# Patient Record
Sex: Female | Born: 1965 | Race: White | Hispanic: No | Smoking: Former smoker
Health system: Southern US, Community
[De-identification: ages and names within clinical notes are randomized; demographics above are authoritative.]

## PROBLEM LIST (undated history)

## (undated) DIAGNOSIS — L309 Dermatitis, unspecified: Secondary | ICD-10-CM

## (undated) DIAGNOSIS — F338 Other recurrent depressive disorders: Secondary | ICD-10-CM

## (undated) DIAGNOSIS — R946 Abnormal results of thyroid function studies: Secondary | ICD-10-CM

## (undated) DIAGNOSIS — E785 Hyperlipidemia, unspecified: Secondary | ICD-10-CM

## (undated) DIAGNOSIS — R600 Localized edema: Secondary | ICD-10-CM

## (undated) DIAGNOSIS — R030 Elevated blood-pressure reading, without diagnosis of hypertension: Secondary | ICD-10-CM

## (undated) DIAGNOSIS — D219 Benign neoplasm of connective and other soft tissue, unspecified: Secondary | ICD-10-CM

## (undated) DIAGNOSIS — R7303 Prediabetes: Secondary | ICD-10-CM

## (undated) DIAGNOSIS — S31831A Laceration without foreign body of anus, initial encounter: Secondary | ICD-10-CM

## (undated) DIAGNOSIS — E063 Autoimmune thyroiditis: Secondary | ICD-10-CM

## (undated) HISTORY — DX: Autoimmune thyroiditis: E06.3

## (undated) HISTORY — DX: Abnormal results of thyroid function studies: R94.6

## (undated) HISTORY — DX: Laceration without foreign body of anus, initial encounter: S31.831A

## (undated) HISTORY — DX: Benign neoplasm of connective and other soft tissue, unspecified: D21.9

## (undated) HISTORY — DX: Other recurrent depressive disorders: F33.8

## (undated) HISTORY — DX: Localized edema: R60.0

## (undated) HISTORY — DX: Hyperlipidemia, unspecified: E78.5

## (undated) HISTORY — DX: Prediabetes: R73.03

---

## 1998-02-10 ENCOUNTER — Other Ambulatory Visit: Admission: RE | Admit: 1998-02-10 | Discharge: 1998-02-10 | Payer: Self-pay | Admitting: Obstetrics & Gynecology

## 1999-07-21 ENCOUNTER — Other Ambulatory Visit: Admission: RE | Admit: 1999-07-21 | Discharge: 1999-07-21 | Payer: Self-pay | Admitting: *Deleted

## 1999-08-09 ENCOUNTER — Encounter: Payer: Self-pay | Admitting: *Deleted

## 1999-08-09 ENCOUNTER — Ambulatory Visit (HOSPITAL_COMMUNITY): Admission: RE | Admit: 1999-08-09 | Discharge: 1999-08-09 | Payer: Self-pay | Admitting: *Deleted

## 1999-09-18 ENCOUNTER — Other Ambulatory Visit: Admission: RE | Admit: 1999-09-18 | Discharge: 1999-09-18 | Payer: Self-pay | Admitting: *Deleted

## 1999-09-18 ENCOUNTER — Encounter (INDEPENDENT_AMBULATORY_CARE_PROVIDER_SITE_OTHER): Payer: Self-pay | Admitting: Specialist

## 1999-12-07 ENCOUNTER — Ambulatory Visit (HOSPITAL_COMMUNITY): Admission: RE | Admit: 1999-12-07 | Discharge: 1999-12-07 | Payer: Self-pay | Admitting: *Deleted

## 1999-12-07 ENCOUNTER — Encounter (INDEPENDENT_AMBULATORY_CARE_PROVIDER_SITE_OTHER): Payer: Self-pay

## 2000-05-09 ENCOUNTER — Other Ambulatory Visit: Admission: RE | Admit: 2000-05-09 | Discharge: 2000-05-09 | Payer: Self-pay | Admitting: *Deleted

## 2000-08-13 HISTORY — PX: CERVICAL BIOPSY  W/ LOOP ELECTRODE EXCISION: SUR135

## 2000-10-28 ENCOUNTER — Other Ambulatory Visit: Admission: RE | Admit: 2000-10-28 | Discharge: 2000-10-28 | Payer: Self-pay | Admitting: Obstetrics and Gynecology

## 2004-02-17 ENCOUNTER — Ambulatory Visit (HOSPITAL_COMMUNITY): Admission: RE | Admit: 2004-02-17 | Discharge: 2004-02-17 | Payer: Self-pay | Admitting: Obstetrics and Gynecology

## 2004-10-20 ENCOUNTER — Ambulatory Visit: Payer: Self-pay | Admitting: Internal Medicine

## 2005-01-03 ENCOUNTER — Other Ambulatory Visit: Admission: RE | Admit: 2005-01-03 | Discharge: 2005-01-03 | Payer: Self-pay | Admitting: Family Medicine

## 2005-07-18 ENCOUNTER — Other Ambulatory Visit: Admission: RE | Admit: 2005-07-18 | Discharge: 2005-07-18 | Payer: Self-pay | Admitting: Obstetrics and Gynecology

## 2005-08-07 ENCOUNTER — Ambulatory Visit (HOSPITAL_COMMUNITY): Admission: RE | Admit: 2005-08-07 | Discharge: 2005-08-07 | Payer: Self-pay | Admitting: Obstetrics and Gynecology

## 2005-12-20 ENCOUNTER — Other Ambulatory Visit: Admission: RE | Admit: 2005-12-20 | Discharge: 2005-12-20 | Payer: Self-pay | Admitting: Obstetrics and Gynecology

## 2006-09-18 ENCOUNTER — Ambulatory Visit (HOSPITAL_COMMUNITY): Admission: RE | Admit: 2006-09-18 | Discharge: 2006-09-18 | Payer: Self-pay | Admitting: Obstetrics and Gynecology

## 2006-10-15 ENCOUNTER — Ambulatory Visit: Payer: Self-pay | Admitting: Internal Medicine

## 2006-10-16 ENCOUNTER — Ambulatory Visit: Payer: Self-pay | Admitting: Internal Medicine

## 2006-10-31 ENCOUNTER — Ambulatory Visit: Payer: Self-pay

## 2006-11-06 ENCOUNTER — Ambulatory Visit: Payer: Self-pay | Admitting: Internal Medicine

## 2007-02-07 ENCOUNTER — Encounter: Payer: Self-pay | Admitting: Internal Medicine

## 2007-02-19 ENCOUNTER — Encounter: Payer: Self-pay | Admitting: Internal Medicine

## 2007-05-29 ENCOUNTER — Ambulatory Visit: Payer: Self-pay | Admitting: Internal Medicine

## 2007-06-08 LAB — CONVERTED CEMR LAB
Cholesterol: 217 mg/dL (ref 0–200)
Direct LDL: 135.8 mg/dL
HDL: 33.6 mg/dL — ABNORMAL LOW (ref >39.0)
Hgb A1c MFr Bld: 5.8 % (ref 4.6–6.0)

## 2007-06-09 DIAGNOSIS — IMO0002 Reserved for concepts with insufficient information to code with codable children: Secondary | ICD-10-CM

## 2007-06-09 DIAGNOSIS — N893 Dysplasia of vagina, unspecified: Secondary | ICD-10-CM

## 2007-06-09 DIAGNOSIS — R809 Proteinuria, unspecified: Secondary | ICD-10-CM | POA: Insufficient documentation

## 2007-06-09 DIAGNOSIS — M50321 Other cervical disc degeneration at C4-C5 level: Secondary | ICD-10-CM | POA: Insufficient documentation

## 2007-06-09 DIAGNOSIS — N809 Endometriosis, unspecified: Secondary | ICD-10-CM | POA: Insufficient documentation

## 2007-06-09 HISTORY — DX: Dysplasia of vagina, unspecified: N89.3

## 2007-06-10 ENCOUNTER — Encounter (INDEPENDENT_AMBULATORY_CARE_PROVIDER_SITE_OTHER): Payer: Self-pay | Admitting: *Deleted

## 2007-06-10 ENCOUNTER — Ambulatory Visit: Payer: Self-pay | Admitting: Internal Medicine

## 2007-06-10 DIAGNOSIS — E782 Mixed hyperlipidemia: Secondary | ICD-10-CM

## 2007-06-10 DIAGNOSIS — E785 Hyperlipidemia, unspecified: Secondary | ICD-10-CM | POA: Insufficient documentation

## 2007-06-10 LAB — CONVERTED CEMR LAB
Cholesterol, target level: 200 mg/dL
HDL goal, serum: 40 mg/dL
LDL Goal: 160 mg/dL

## 2007-07-21 ENCOUNTER — Ambulatory Visit: Payer: Self-pay | Admitting: Internal Medicine

## 2007-10-23 ENCOUNTER — Ambulatory Visit (HOSPITAL_COMMUNITY): Admission: RE | Admit: 2007-10-23 | Discharge: 2007-10-23 | Payer: Self-pay | Admitting: Obstetrics and Gynecology

## 2008-02-12 ENCOUNTER — Ambulatory Visit: Payer: Self-pay | Admitting: Internal Medicine

## 2008-02-17 ENCOUNTER — Ambulatory Visit: Payer: Self-pay | Admitting: Internal Medicine

## 2008-02-26 ENCOUNTER — Telehealth: Payer: Self-pay | Admitting: Internal Medicine

## 2008-02-27 ENCOUNTER — Ambulatory Visit: Payer: Self-pay | Admitting: Internal Medicine

## 2008-02-27 LAB — CONVERTED CEMR LAB
AST: 23 units/L (ref 0–37)
Albumin: 4 g/dL (ref 3.5–5.2)
Alkaline Phosphatase: 75 units/L (ref 39–117)
BUN: 14 mg/dL (ref 6–23)
Bilirubin Urine: NEGATIVE
Bilirubin, Direct: 0.1 mg/dL (ref 0.0–0.3)
Chloride: 94 meq/L — ABNORMAL LOW (ref 96–112)
Cholesterol, target level: 200 mg/dL
Glucose, Bld: 120 mg/dL — ABNORMAL HIGH (ref 70–99)
Glucose, Urine, Semiquant: NEGATIVE
Hgb A1c MFr Bld: 6.1 % — ABNORMAL HIGH (ref 4.6–6.0)
Ketones, urine, test strip: NEGATIVE
LDL Goal: 100 mg/dL
Potassium: 3.5 meq/L (ref 3.5–5.1)
Protein, U semiquant: NEGATIVE
Sodium: 134 meq/L — ABNORMAL LOW (ref 135–145)
Total Protein: 8 g/dL (ref 6.0–8.3)
Urobilinogen, UA: 0.2
pH: 5

## 2008-02-28 ENCOUNTER — Encounter: Payer: Self-pay | Admitting: Internal Medicine

## 2008-03-01 ENCOUNTER — Encounter (INDEPENDENT_AMBULATORY_CARE_PROVIDER_SITE_OTHER): Payer: Self-pay | Admitting: *Deleted

## 2008-03-03 ENCOUNTER — Encounter: Payer: Self-pay | Admitting: Internal Medicine

## 2008-06-04 ENCOUNTER — Ambulatory Visit: Payer: Self-pay | Admitting: Internal Medicine

## 2008-06-07 ENCOUNTER — Encounter (INDEPENDENT_AMBULATORY_CARE_PROVIDER_SITE_OTHER): Payer: Self-pay | Admitting: *Deleted

## 2008-06-07 LAB — CONVERTED CEMR LAB
AST: 16 units/L (ref 0–37)
Albumin: 3.8 g/dL (ref 3.5–5.2)
Alkaline Phosphatase: 64 units/L (ref 39–117)
BUN: 15 mg/dL (ref 6–23)
Bilirubin, Direct: 0.1 mg/dL (ref 0.0–0.3)
Creatinine, Ser: 0.9 mg/dL (ref 0.4–1.2)
LDL Cholesterol: 137 mg/dL — ABNORMAL HIGH (ref 0–99)
Potassium: 4.1 meq/L (ref 3.5–5.1)
Total Bilirubin: 0.8 mg/dL (ref 0.3–1.2)
Total CHOL/HDL Ratio: 5.2
Triglycerides: 91 mg/dL (ref 0–149)

## 2008-06-18 ENCOUNTER — Ambulatory Visit: Payer: Self-pay | Admitting: Internal Medicine

## 2008-06-18 LAB — CONVERTED CEMR LAB
Cholesterol, target level: 200 mg/dL
HDL goal, serum: 50 mg/dL
LDL Goal: 100 mg/dL

## 2008-08-26 ENCOUNTER — Encounter: Payer: Self-pay | Admitting: Internal Medicine

## 2008-09-22 ENCOUNTER — Encounter: Payer: Self-pay | Admitting: Internal Medicine

## 2008-09-27 ENCOUNTER — Telehealth (INDEPENDENT_AMBULATORY_CARE_PROVIDER_SITE_OTHER): Payer: Self-pay | Admitting: *Deleted

## 2008-09-29 ENCOUNTER — Ambulatory Visit: Payer: Self-pay | Admitting: Internal Medicine

## 2008-10-03 LAB — CONVERTED CEMR LAB
ALT: 21 units/L (ref 0–35)
AST: 20 units/L (ref 0–37)
Alkaline Phosphatase: 61 units/L (ref 39–117)
BUN: 16 mg/dL (ref 6–23)
Bilirubin, Direct: 0.1 mg/dL (ref 0.0–0.3)
Creatinine, Ser: 0.8 mg/dL (ref 0.4–1.2)
Creatinine,U: 93.1 mg/dL
Hgb A1c MFr Bld: 5.8 % (ref 4.6–6.0)
Total Bilirubin: 0.7 mg/dL (ref 0.3–1.2)
Total CHOL/HDL Ratio: 4

## 2008-10-04 ENCOUNTER — Ambulatory Visit: Payer: Self-pay | Admitting: Internal Medicine

## 2008-11-29 ENCOUNTER — Ambulatory Visit: Payer: Self-pay | Admitting: Internal Medicine

## 2008-12-01 ENCOUNTER — Encounter (INDEPENDENT_AMBULATORY_CARE_PROVIDER_SITE_OTHER): Payer: Self-pay | Admitting: *Deleted

## 2008-12-01 LAB — CONVERTED CEMR LAB
T3, Free: 2.8 pg/mL (ref 2.3–4.2)
TSH: 3.48 microintl units/mL (ref 0.35–5.50)

## 2009-04-25 ENCOUNTER — Ambulatory Visit: Payer: Self-pay | Admitting: Internal Medicine

## 2009-04-26 ENCOUNTER — Encounter: Payer: Self-pay | Admitting: Internal Medicine

## 2009-04-29 ENCOUNTER — Telehealth (INDEPENDENT_AMBULATORY_CARE_PROVIDER_SITE_OTHER): Payer: Self-pay | Admitting: *Deleted

## 2009-04-29 ENCOUNTER — Ambulatory Visit: Payer: Self-pay | Admitting: Internal Medicine

## 2009-04-29 ENCOUNTER — Encounter (INDEPENDENT_AMBULATORY_CARE_PROVIDER_SITE_OTHER): Payer: Self-pay | Admitting: *Deleted

## 2009-04-29 LAB — CONVERTED CEMR LAB: Rhuematoid fact SerPl-aCnc: 20 intl units/mL (ref 0.0–20.0)

## 2009-05-01 ENCOUNTER — Encounter: Payer: Self-pay | Admitting: Internal Medicine

## 2009-05-01 LAB — CONVERTED CEMR LAB
AST: 19 units/L (ref 0–37)
Albumin: 3.7 g/dL (ref 3.5–5.2)
Alkaline Phosphatase: 58 units/L (ref 39–117)
Bilirubin, Direct: 0 mg/dL (ref 0.0–0.3)
HDL: 35 mg/dL — ABNORMAL LOW (ref >39.00)
Hgb A1c MFr Bld: 6 % (ref 4.6–6.5)
TSH: 2.1 microintl units/mL (ref 0.35–5.50)
Total CHOL/HDL Ratio: 4
Total Protein: 7.4 g/dL (ref 6.0–8.3)
VLDL: 16.2 mg/dL (ref 0.0–40.0)

## 2009-05-02 ENCOUNTER — Encounter (INDEPENDENT_AMBULATORY_CARE_PROVIDER_SITE_OTHER): Payer: Self-pay | Admitting: *Deleted

## 2009-05-20 ENCOUNTER — Encounter: Payer: Self-pay | Admitting: Internal Medicine

## 2009-08-31 ENCOUNTER — Ambulatory Visit: Payer: Self-pay | Admitting: Internal Medicine

## 2009-08-31 LAB — CONVERTED CEMR LAB
Bilirubin Urine: NEGATIVE
Nitrite: NEGATIVE
Specific Gravity, Urine: <1.005
Urobilinogen, UA: 0.2

## 2009-09-01 LAB — CONVERTED CEMR LAB
ALT: 16 units/L (ref 0–35)
Albumin: 3.9 g/dL (ref 3.5–5.2)
Basophils Relative: 0.7 % (ref 0.0–3.0)
CO2: 26 meq/L (ref 19–32)
Cholesterol: 161 mg/dL (ref 0–200)
Eosinophils Relative: 2.1 % (ref 0.0–5.0)
GFR calc non Af Amer: 71.64 mL/min (ref >60)
Glucose, Bld: 95 mg/dL (ref 70–99)
HDL: 45 mg/dL (ref >39.00)
Lymphocytes Relative: 23.1 % (ref 12.0–46.0)
Neutrophils Relative %: 64.3 % (ref 43.0–77.0)
Potassium: 4.1 meq/L (ref 3.5–5.1)
RBC: 4.39 M/uL (ref 3.87–5.11)
Sodium: 140 meq/L (ref 135–145)
Total Protein: 7.3 g/dL (ref 6.0–8.3)
VLDL: 14 mg/dL (ref 0.0–40.0)
WBC: 6.7 10*3/uL (ref 4.5–10.5)

## 2009-09-09 ENCOUNTER — Ambulatory Visit: Payer: Self-pay | Admitting: Internal Medicine

## 2010-09-10 LAB — CONVERTED CEMR LAB
Basophils Absolute: 0 10*3/uL (ref 0.0–0.1)
Basophils Relative: 0.5 % (ref 0.0–1.0)
Calcium: 8.9 mg/dL (ref 8.4–10.5)
Eosinophils Absolute: 0.1 10*3/uL (ref 0.0–0.6)
Eosinophils Relative: 2.2 % (ref 0.0–5.0)
Free T4: 0.7 ng/dL (ref 0.6–1.6)
HCT: 40 % (ref 36.0–46.0)
Hemoglobin: 13.8 g/dL (ref 12.0–15.0)
Hgb A1c MFr Bld: 5.9 % (ref 4.6–6.0)
Lymphocytes Relative: 28.2 % (ref 12.0–46.0)
MCHC: 34.6 g/dL (ref 30.0–36.0)
MCV: 84.5 fL (ref 78.0–100.0)
Magnesium: 2.1 mg/dL (ref 1.5–2.5)
Monocytes Absolute: 0.6 10*3/uL (ref 0.2–0.7)
Monocytes Relative: 8.9 % (ref 3.0–11.0)
Neutro Abs: 4 10*3/uL (ref 1.4–7.7)
Neutrophils Relative %: 60.2 % (ref 43.0–77.0)
Platelets: 236 10*3/uL (ref 150–400)
Potassium: 4 meq/L (ref 3.5–5.1)
RBC: 4.73 M/uL (ref 3.87–5.11)
RDW: 13 % (ref 11.5–14.6)
T3, Free: 3.4 pg/mL (ref 2.3–4.2)
TSH: 2.14 microintl units/mL (ref 0.35–5.50)
WBC: 6.6 10*3/uL (ref 4.5–10.5)

## 2010-09-12 NOTE — Assessment & Plan Note (Signed)
Summary: CPX,LABS PRIOR,BCBS INS/RH.....   Vital Signs:  Patient profile:   45 year old female Height:      68.25 inches Weight:      190.6 pounds BMI:     28.87 Temp:     98.3 degrees F oral Pulse rate:   72 / minute Resp:     14 per minute BP sitting:   102 / 68  (left arm) Cuff size:   large  Vitals Entered By: Shonna Chock (September 09, 2009 2:21 PM) CC: CPX and discuss labs (copy given) Comments REVIEWED MED LIST, PATIENT AGREED DOSE AND INSTRUCTION CORRECT    CC:  CPX and discuss labs (copy given).  History of Present Illness: Allison Compton is here for a physical; she is getting over an URI. Also she strained R shoulder  in yoga.  She has lost 40 # with TLC through her church. Present lipids were done 1 month off Pravastatin 40 mg at bedtime .  Preventive Screening-Counseling & Management  Alcohol-Tobacco     Smoking Status: quit  Caffeine-Diet-Exercise     Does Patient Exercise: yes  Allergies (verified): No Known Drug Allergies  Past History:  Past Medical History: Hyperlipidemia : LDL 148(2167/1290), TG 113, HDL 52                                                                                              Eczema;                                                                                                                                                                abnormal TFTs 09/2008, PMH of;                                                                                                                                 Uterine cysts  with dysfunctional menses  Past Surgical History: surgery for cervical  dysplasia X1; G1 P1  Family History: MGM Pancreatic CA;PGM Pancreatic CA, "blood CA" ; MGF MI @ 59 Father: renal CA, HTN Mother: Thrombocytosis, DM controlled with diet, SAD,asthma bro: gastric bleed, ERD; sister SAD  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         To have Lasik 10/13/2009 ENT:  Complains of hoarseness, nasal congestion, and sinus pressure; denies ear discharge, earache, and sore throat; No frontal headache or facial pain ; purulence from nose. Occasional dysphagia @ end of meal.. Resp:  Denies shortness of breath, sputum productive, and wheezing; Minor cough.  Physical Exam  General:  well-nourished,in no acute distress; alert,appropriate and cooperative throughout examination Head:  Normocephalic and atraumatic without obvious abnormalities. Eyes:  No corneal or conjunctival inflammation noted. Perrla. Funduscopic exam benign, without hemorrhages, exudates or papilledema. Ears:  External ear exam shows no significant lesions or deformities.  Otoscopic examination reveals clear canals, tympanic membranes are intact bilaterally without bulging, retraction, inflammation or discharge. Hearing is grossly normal bilaterally. Nose:  External nasal examination shows no deformity or inflammation. Nasal mucosa are  slightly dry without lesions or exudates. Mouth:  Oral mucosa and oropharynx without lesions or exudates.  Teeth in good repair. Neck:  No deformities, masses, or tenderness noted. Lungs:  Normal respiratory effort, chest expands symmetrically. Lungs are clear to auscultation, no crackles or wheezes. Heart:  Normal rate and regular rhythm. S1 and S2 normal without gallop, murmur, click, rub . S4 Abdomen:  Bowel sounds positive,abdomen soft and non-tender without masses, organomegaly or hernias noted. Msk:  Minor  asymmetry of posterior thoracic musculature Pulses:  R and L carotid,radial,dorsalis pedis and posterior tibial pulses are full and equal bilaterally Extremities:  No clubbing, cyanosis, edema, or deformity noted with normal full range of motion of all joints.   Neurologic:  alert & oriented X3 and DTRs symmetrical and normal.   Skin:  Mild eczematoid rash both shins Cervical Nodes:  No lymphadenopathy noted Axillary Nodes:  No palpable lymphadenopathy Psych:  memory intact for recent and remote, normally interactive, and good eye contact.  Animated & interactive    Impression & Recommendations:  Problem # 1:  PREVENTIVE HEALTH CARE (ICD-V70.0)  Orders: EKG w/ Interpretation (93000)  Problem # 2:  HYPERLIPIDEMIA (ICD-272.2)  The following medications were removed from the medication list:    Pravachol 40 Mg Tabs (Pravastatin sodium) .Marland Kitchen... 1 by mouth at bedtime  Orders: EKG w/ Interpretation (93000)  Complete Medication List: 1)  Fish Oil  .... 2 by mouth once daily  Patient Instructions: 1)  Continue the TLC which have been so incredibly positive for preventive health care. 2)  Please schedule a follow-up appointment in 4 months. 3)  You need to lose weight. Consider a lower calorie diet and regular exercise.  4)  NMR Lipoprofile Lipid Panel : 272.4

## 2010-09-20 ENCOUNTER — Ambulatory Visit: Payer: Self-pay | Admitting: Internal Medicine

## 2010-09-21 ENCOUNTER — Ambulatory Visit: Payer: Self-pay | Admitting: Internal Medicine

## 2010-09-21 ENCOUNTER — Other Ambulatory Visit: Payer: Self-pay | Admitting: Internal Medicine

## 2010-09-21 ENCOUNTER — Encounter: Payer: Self-pay | Admitting: Internal Medicine

## 2010-09-21 ENCOUNTER — Encounter (INDEPENDENT_AMBULATORY_CARE_PROVIDER_SITE_OTHER): Payer: Self-pay | Admitting: *Deleted

## 2010-09-21 ENCOUNTER — Other Ambulatory Visit (INDEPENDENT_AMBULATORY_CARE_PROVIDER_SITE_OTHER): Payer: 59

## 2010-09-21 DIAGNOSIS — Z0000 Encounter for general adult medical examination without abnormal findings: Secondary | ICD-10-CM

## 2010-09-21 LAB — HEPATIC FUNCTION PANEL
AST: 13 U/L (ref 0–37)
Albumin: 3.9 g/dL (ref 3.5–5.2)
Alkaline Phosphatase: 60 U/L (ref 39–117)
Total Protein: 7.2 g/dL (ref 6.0–8.3)

## 2010-09-21 LAB — CBC WITH DIFFERENTIAL/PLATELET
Basophils Absolute: 0 10*3/uL (ref 0.0–0.1)
Eosinophils Relative: 2.4 % (ref 0.0–5.0)
Lymphocytes Relative: 26.4 % (ref 12.0–46.0)
Lymphs Abs: 2 10*3/uL (ref 0.7–4.0)
Monocytes Relative: 7.9 % (ref 3.0–12.0)
Neutrophils Relative %: 62.8 % (ref 43.0–77.0)
Platelets: 200 10*3/uL (ref 150.0–400.0)
RDW: 13.7 % (ref 11.5–14.6)
WBC: 7.7 10*3/uL (ref 4.5–10.5)

## 2010-09-21 LAB — BASIC METABOLIC PANEL
CO2: 26 mEq/L (ref 19–32)
Chloride: 101 mEq/L (ref 96–112)
Creatinine, Ser: 0.9 mg/dL (ref 0.4–1.2)
Potassium: 4.1 mEq/L (ref 3.5–5.1)
Sodium: 135 mEq/L (ref 135–145)

## 2010-09-21 LAB — TSH: TSH: 2.48 u[IU]/mL (ref 0.35–5.50)

## 2010-09-25 ENCOUNTER — Ambulatory Visit: Payer: 59 | Admitting: Internal Medicine

## 2010-10-06 ENCOUNTER — Encounter: Payer: Self-pay | Admitting: Internal Medicine

## 2010-10-06 ENCOUNTER — Ambulatory Visit: Payer: 59 | Admitting: Internal Medicine

## 2010-10-06 ENCOUNTER — Ambulatory Visit (INDEPENDENT_AMBULATORY_CARE_PROVIDER_SITE_OTHER): Payer: 59 | Admitting: Internal Medicine

## 2010-10-06 DIAGNOSIS — E782 Mixed hyperlipidemia: Secondary | ICD-10-CM

## 2010-10-06 DIAGNOSIS — R609 Edema, unspecified: Secondary | ICD-10-CM | POA: Insufficient documentation

## 2010-10-10 NOTE — Assessment & Plan Note (Signed)
Summary: tightness in chest/cbs; went to ER--had pleurisy, needs 30 mi...   Vital Signs:  Patient profile:   45 year old female Weight:      219.4 pounds BMI:     33.24 Temp:     98.3 degrees F oral Pulse rate:   76 / minute Resp:     12 per minute BP sitting:   120 / 72  (left arm) Cuff size:   large  Vitals Entered By: Shonna Chock CMA (October 06, 2010 3:31 PM) CC: 1.) ER follow-up: Chest tightness   2.) Complete form, Lipid Management   CC:  1.) ER follow-up: Chest tightness   2.) Complete form and Lipid Management.  History of Present Illness:    NMR Lipoprofile reviewed & risks discussed. She was seen @ [facility] in [place] last week; Tramadol & heat resolved " pleurisy".Pedal edema is intermittent dependent on salt intake.  The patient denies the following symptoms: exercise intolerance, dypsnea, palpitations, and syncope.  Dietary compliance has been poor but is improving.  The patient reports no exercise until last week.  Adjunctive measures currently used by the patient include fiber, ASA, fish oil supplements, and Co-Q 10..    Lipid Management History:      Negative NCEP/ATP III risk factors include female age less than 8 years old, non-diabetic, no family history for ischemic heart disease, non-tobacco-user status, non-hypertensive, no ASHD (atherosclerotic heart disease), no prior stroke/TIA, no peripheral vascular disease, and no history of aortic aneurysm.     Current Medications (verified): 1)  Fish Oil .... 2 By Mouth Once Daily  Allergies (verified): No Known Drug Allergies  Past History:  Past Medical History: Hyperlipidemia : LDL 148 (2167/1290), TG 113, HDL 52 . LDL goal = < 100, ideally < 70.                                                                                          Eczema;                                                                                                                                                                 abnormal TFTs 09/2008, PMH of;  Uterine cysts with dysfunctional menses  Physical Exam  General:  well-nourished;alert,appropriate and cooperative throughout examination Lungs:  Normal respiratory effort, chest expands symmetrically. Lungs are clear to auscultation, no crackles or wheezes. Heart:  Normal rate and regular rhythm. S1 and S2 normal without gallop, murmur, click, rub. Pulses:  R and L carotid,radial,dorsalis pedis and posterior tibial pulses are full and equal bilaterally Extremities:  trace left pedal edema and trace right pedal edema.   Psych:  memory intact for recent and remote, normally interactive, and good eye contact.   Focused & motivated   Impression & Recommendations:  Problem # 1:  HYPERLIPIDEMIA (ICD-272.2) NMR Lipoprofiles  reviewed; improvement documented   Problem # 2:  EDEMA- LOCALIZED (ICD-782.3)  Her updated medication list for this problem includes:    Spironolactone 25 Mg Tabs (Spironolactone) .Marland Kitchen... 1 once daily as needed for edema  Complete Medication List: 1)  Fish Oil  .... 2 by mouth once daily 2)  Spironolactone 25 Mg Tabs (Spironolactone) .Marland Kitchen.. 1 once daily as needed for edema  Lipid Assessment/Plan:      Based on NCEP/ATP III, the patient's risk factor category is "0-1 risk factors".  The patient's lipid goals are as follows: Total cholesterol goal is 200; LDL cholesterol goal is 100; HDL cholesterol goal is 50; Triglyceride goal is 150.  Her LDL cholesterol goal has not been met.  Secondary causes for hyperlipidemia have been ruled out.  She has been counseled on adjunctive measures for lowering her cholesterol and has been provided with dietary instructions.    Patient Instructions: 1)  Consider a [place] Heart Panel in 4 months ( 786.50, 272.4). 2)  It is important that you exercise regularly at least 20 minutes 5 times  a week. If you develop chest pain, have severe difficulty breathing, or feel very tired , stop exercising immediately and seek medical attention. Prescriptions: SPIRONOLACTONE 25 MG TABS (SPIRONOLACTONE) 1 once daily as needed for edema  #30 x 5   Entered and Authorized by:   Marga Melnick MD   Signed by:   Marga Melnick MD on 10/06/2010   Method used:   Print then Give to Patient   RxID:   [id number]    Orders Added: 1)  Est. Patient Level III [84132]

## 2010-12-06 ENCOUNTER — Other Ambulatory Visit: Payer: Self-pay | Admitting: Orthopedic Surgery

## 2010-12-06 DIAGNOSIS — M25562 Pain in left knee: Secondary | ICD-10-CM

## 2010-12-23 ENCOUNTER — Ambulatory Visit
Admission: RE | Admit: 2010-12-23 | Discharge: 2010-12-23 | Disposition: A | Discharge status: Home / Self Care | Payer: 59 | Source: Ambulatory Visit | Attending: Orthopedic Surgery | Admitting: Orthopedic Surgery

## 2010-12-23 DIAGNOSIS — M25562 Pain in left knee: Secondary | ICD-10-CM

## 2010-12-29 NOTE — Op Note (Signed)
[facility]  Patient:    Allison Compton, Allison Compton                       MRN: [id number] Proc. Date: 12/07/99 Adm. Date:  [id number] Attending:  Pleas Koch                           Operative Report  PREOPERATIVE DIAGNOSIS:  High grade cervical dysplasia.  POSTOPERATIVE DIAGNOSIS:  High grade cervical dysplasia.  OPERATION:  Loop electrosurgical excision procedure (LEEP).  SURGEON:  Georgina Peer, M.D.  ANESTHESIA:  MAC using 10 cc 2% Xylocaine with epinephrine 1:100,000 intracervical block.  ESTIMATED BLOOD LOSS:  Less than 25 cc.  COMPLICATIONS:  None.  INDICATIONS:  The patient is a 45 year old white female with abnormal Pap smear, worked up with colposcopy and biopsy showing high grade cervical dysplasill-appearing (CIN2).  She was brought in for excision of these abnormal areas on her cervix.  DESCRIPTION OF PROCEDURE:  The patient was taken to the operating room, given intravenous sedation, placed in dorsal lithotomy position.  Speculum was placed in her vagina.  Cervix was washed with acetic acid and stained with iodine.  Nonstaining areas previously noted in the office were confirmed and had not changed.  10 cc of 2% Xylocaine with epinephrine 1:100,000 was injected intracervically into the 12, 3, 6 and 9 oclock positions.  The cervix blanched.  A white-handled loop 15 mm in diameter set at a depth of 8 mm was used to excise the anterior and posterior lips and endocervical portion.  Bleeders were cauterized with wet ball coagulation and Monsels was applied.  There was complete hemostasis.  Specimens were marked, pinned to cork board and sent to pathology.  The patient tolerated the procedure well. Sponge, needle and instrument counts were correct.  The patient was returned to the recovery area in good condition and will follow up in the office in four weeks.  She will call for heavy bleeding, fever, chills or unusually heavy or  malodorous vaginal discharge.DD:  12/07/99 TD:  12/07/99 Job: [id number]

## 2011-08-24 ENCOUNTER — Telehealth: Payer: Self-pay

## 2011-08-24 NOTE — Telephone Encounter (Signed)
Pt states for the past few days she has had an odd sensation in her chest area.  Pt states she had an achy shoulder then it moved to her arm on the left side under pt's breast.  Pt denies nausea, vomiting, or fatigue. Pt states she has had SOB.  Pt has been taking a low does asa.  Pt advised to go to ER immediately.  Pt is aware.

## 2011-10-16 ENCOUNTER — Telehealth: Payer: Self-pay | Admitting: Internal Medicine

## 2011-10-16 NOTE — Telephone Encounter (Signed)
Patient is coming in tomorrow for labs & in April for a CPE. Patient needs forms filled out for her Medical insurance before 3/31. Can she drop them off & you speak to Dr Alwyn Ren to see if he will do this for her? Patient ph# [phone]

## 2011-10-16 NOTE — Telephone Encounter (Signed)
I called patient and informed her if forms require filling in information about labs we will fill in and sign. If forms require any additional information like: vision, weight, height, BMI, waist measurement, ect . . . .she will need appointment before completion of form. Patient to call back if she has questions or concerns

## 2011-10-17 ENCOUNTER — Other Ambulatory Visit (INDEPENDENT_AMBULATORY_CARE_PROVIDER_SITE_OTHER): Payer: 59

## 2011-10-17 DIAGNOSIS — E782 Mixed hyperlipidemia: Secondary | ICD-10-CM

## 2011-10-17 LAB — LIPID PANEL
HDL: 49.3 mg/dL (ref >39.00)
Total CHOL/HDL Ratio: 4
Triglycerides: 123 mg/dL (ref 0.0–149.0)

## 2011-11-06 ENCOUNTER — Telehealth: Payer: Self-pay | Admitting: Internal Medicine

## 2011-11-06 NOTE — Telephone Encounter (Signed)
I thought I sent this to [place]. Forms is on my desk; it requires up to date measurements. I filled in  what I could. No charge for form

## 2011-11-06 NOTE — Telephone Encounter (Signed)
I spoke with [place] and she will contact patient for a nurse visit

## 2011-11-06 NOTE — Telephone Encounter (Signed)
Dr.Hopper please advise 

## 2011-11-06 NOTE — Telephone Encounter (Signed)
Patient called & was wondering if the forms she dropped off last week are ready for pick up? Please review & advise & I will call patient back Thanks

## 2011-11-08 ENCOUNTER — Ambulatory Visit (INDEPENDENT_AMBULATORY_CARE_PROVIDER_SITE_OTHER): Payer: 59 | Admitting: Internal Medicine

## 2011-11-08 VITALS — BP 120/76 | HR 69 | Ht 68.0 in | Wt 231.0 lb

## 2011-11-08 DIAGNOSIS — Z026 Encounter for examination for insurance purposes: Secondary | ICD-10-CM

## 2011-11-08 DIAGNOSIS — Z0289 Encounter for other administrative examinations: Secondary | ICD-10-CM

## 2011-11-09 NOTE — Progress Notes (Signed)
  Subjective:    Patient ID: Allison Compton, female    DOB: 03-03-1966, 46 y.o.   MRN: [id number]  HPI Here for form completion; CPX pending. No Charge    Review of Systems     Objective:   Physical Exam        Assessment & Plan:

## 2011-12-07 ENCOUNTER — Ambulatory Visit (INDEPENDENT_AMBULATORY_CARE_PROVIDER_SITE_OTHER): Payer: 59 | Admitting: Internal Medicine

## 2011-12-07 ENCOUNTER — Encounter: Payer: Self-pay | Admitting: Internal Medicine

## 2011-12-07 VITALS — BP 110/64 | HR 82 | Temp 98.1°F | Resp 12 | Ht 68.25 in | Wt 230.8 lb

## 2011-12-07 DIAGNOSIS — E782 Mixed hyperlipidemia: Secondary | ICD-10-CM

## 2011-12-07 DIAGNOSIS — Z0000 Encounter for general adult medical examination without abnormal findings: Secondary | ICD-10-CM

## 2011-12-07 LAB — BASIC METABOLIC PANEL
Calcium: 9.1 mg/dL (ref 8.4–10.5)
GFR: 94.81 mL/min (ref >60.00)
Potassium: 3.5 mEq/L (ref 3.5–5.1)
Sodium: 138 mEq/L (ref 135–145)

## 2011-12-07 LAB — ALT: ALT: 16 U/L (ref 0–35)

## 2011-12-07 NOTE — Progress Notes (Signed)
Subjective:    Patient ID: Allison Compton, female    DOB: Jun 04, 1966, 46 y.o.   MRN: [id number]  HPI  Zahraa  is here for a physical;acute issues include edema in context of decreased exercise and 25 pound weight gain.      Review of Systems Patient reports no significant  vision/ hearing  changes, adenopathy,fever, persistant / recurrent hoarseness , swallowing issues, chest pain,edema,persistant /recurrent cough, hemoptysis, dyspnea( rest/ exertional/paroxysmal nocturnal), gastrointestinal bleeding(melena, rectal bleeding), abdominal pain, significant heartburn, GU symptoms(dysuria, hematuria,pyuria, incontinence),   syncope, focal weakness, memory loss,numbness & tingling, skin/hair /nail changes,abnormal bruising or bleeding, anxiety,or depression. She was seen for palpitations in January; cardiac evaluation was negative. She does have palpitations with ingestion of caffeine. Uterine fibroids are associated with some pelvic discomfort at the time of menses.  Past year she's had recurrent skin lesions. These are not frank pustules but do cluster as papules. She has a past history of eczema Intermittently she'll have loose stools       papules orObjective:   Physical Exam Gen.: Healthy and well-nourished in appearance. Alert, appropriate and cooperative throughout exam. Head: Normocephalic without obvious abnormalities Eyes: No corneal or conjunctival inflammation noted. Pupils equal round reactive to light and accommodation. Fundal exam is benign without hemorrhages, exudate, papilledema. Extraocular motion intact. Vision grossly normal. Ears: External  ear exam reveals no significant lesions or deformities. Canals clear .TMs normal. Hearing is grossly normal bilaterally. Nose: External nasal exam reveals no deformity or inflammation. Nasal mucosa are pink and moist. No lesions or exudates noted.   Mouth: Oral mucosa and oropharynx reveal no lesions or exudates. Teeth in good repair. Neck:  No deformities, masses, or tenderness noted. Range of motion & Thyroid normal. Lungs: Normal respiratory effort; chest expands symmetrically. Lungs are clear to auscultation without rales, wheezes, or increased work of breathing. Heart: Normal rate and rhythm. Normal S1 and S2. No gallop, click, or rub. Grade 1/2- 1 over 6 systolic murmur . Abdomen: Bowel sounds normal; abdomen soft and nontender. No masses, organomegaly or hernias noted. Genitalia:Dr Messer Musculoskeletal/extremities: No deformity or scoliosis noted of  the thoracic or lumbar spine. No clubbing, cyanosis, or deformity noted. Range of motion  normal .Tone & strength  normal.Joints normal. Nail health  good. 1+ pedal edema, most notable on the dorsum of the feet Vascular: Carotid, radial artery, dorsalis pedis and  posterior tibial pulses are full and equal. No bruits present. Neurologic: Alert and oriented x3. Deep tendon reflexes symmetrical and normal.          Skin: Intact without suspicious lesions or rashes. Noninflamed papules are noted of the right lateral shin  Lymph: No cervical, axillary, or inguinal lymphadenopathy present. Psych: Mood and affect are normal. Normally interactive                                                                                         Assessment & Plan:  #1 comprehensive physical exam; no acute findings #2 see Problem List with Assessments & Recommendations  #3 palpitations related to exogenous stress and stimulants  #4 doubt changes suggestive of irritable bowel variant  #5 skin lesions  suggestive of  Low-grade folliculitis Plan: see Orders

## 2011-12-07 NOTE — Progress Notes (Signed)
Addended byPecola Lawless on: 12/07/2011 02:35 PM   Modules accepted: Orders

## 2011-12-07 NOTE — Patient Instructions (Addendum)
Preventive Health Care: Exercise  30-45  minutes a day, 3-4 days a week. Walking is especially valuable in preventing Osteoporosis. Cleanse  the skin lesions daily with Hibiclens. Report any  pustules to allow culture. Eat a low-fat diet with lots of fruits and vegetables, up to 7-9 servings per day. Consume less than 30 (preferably ZERO) grams of sugar per day from foods & drinks with High Fructose Corn Syrup (HFCS) sugar as #1,2,3 or # 4 on label.Whole Foods, [employer] & [employer] do not carry products with HFCS. Follow a  low carb nutrition program such as [place] or The New Sugar Busters  to prevent Diabetes progression . White carbohydrates (potatoes, rice, bread, and pasta) have a high spike of sugar and a high load of sugar. For example a  baked potato has a cup of sugar and a  french fry  2 teaspoons of sugar. Yams, wild  rice, whole grained bread &  wheat pasta have been much lower spike and load of  sugar. Portions should be the size of a deck of cards or your palm.  Please take the probiotic , Align, every day until the bowels are normal. This will replace the normal bacteria which  are necessary for formation of normal stool and processing of food.  To prevent palpitations or premature beats, avoid stimulants such as decongestants, diet pills, nicotine, or caffeine (coffee, tea, cola, or chocolate) to excess.  Please try to go on My Chart within the next 24 hours to allow me to release the results directly to you.

## 2012-02-19 ENCOUNTER — Encounter: Payer: Self-pay | Admitting: Internal Medicine

## 2012-02-19 ENCOUNTER — Ambulatory Visit (INDEPENDENT_AMBULATORY_CARE_PROVIDER_SITE_OTHER): Payer: 59 | Admitting: Internal Medicine

## 2012-02-19 VITALS — BP 126/78 | HR 83 | Temp 97.6°F | Wt 234.0 lb

## 2012-02-19 DIAGNOSIS — R0600 Dyspnea, unspecified: Secondary | ICD-10-CM

## 2012-02-19 DIAGNOSIS — R0789 Other chest pain: Secondary | ICD-10-CM

## 2012-02-19 DIAGNOSIS — R609 Edema, unspecified: Secondary | ICD-10-CM

## 2012-02-19 DIAGNOSIS — R0609 Other forms of dyspnea: Secondary | ICD-10-CM

## 2012-02-19 DIAGNOSIS — R809 Proteinuria, unspecified: Secondary | ICD-10-CM

## 2012-02-19 LAB — D-DIMER, QUANTITATIVE: D-Dimer, Quant: 0.43 ug/mL-FEU (ref 0.00–0.48)

## 2012-02-19 MED ORDER — FUROSEMIDE 40 MG PO TABS: 40.0000 mg | ORAL_TABLET | Freq: Every day | ORAL | Status: DC

## 2012-02-19 NOTE — Progress Notes (Signed)
Subjective:    Patient ID: Allison Compton, female    DOB: 03-03-1966, 46 y.o.   MRN: [id number]  HPI She has had edema somewhat chronically but has increased significantly in the last week, especially in the L leg. There's been no trigger such as increased salt or prolonged travel; but she had increased alcohol over weekend. She has also been sitting at her desk more recently.  Yesterday she began to have discomfort in the right lower extremity from the inferior thigh posteriorly to the upper popliteal space. She was seen at [facility] urgent care; CMET was ordered. Those results have been requested. She was sent to a local hospital for ultrasound which was negative for deep venous thrombosis.  She has increased her water intake which seems to have helped the edema.  Over the weekend 7/6-7 she noticed some pressure in her chest and had some mild paroxysmal nocturnal dyspnea. The symptoms occurred when she tried to sleep with her legs elevated. Her husband has described snoring which has been positional. There is no history of apnea.  She does have a history of proteinuria. In April of this year her creatinine was 0.7, BUN 13, and GFR 94.81. Glucose was 157 but A1c was 6.2% in March.    Review of Systems She's had loose stools for several months; there's been no response to supplements with a  probiotic. She is not had hoarseness, dysphagia, unexplained weight loss, melena, or rectal bleeding. Intermittently she'll have  irregular bowel movements followed by loose stools. There is no family history of GI disease except for pancreatic cancer in her grandmother     Objective:   Physical Exam Gen.: Healthy and well-nourished in appearance. Alert, appropriate and cooperative throughout exam. Eyes: No corneal or conjunctival inflammation noted. No icterus   Mouth: Oral mucosa and oropharynx reveal no lesions or exudates. Teeth in good repair. Neck: No deformities, masses, or tenderness noted.  Thyroid full w/o nodularity Lungs: Normal respiratory effort; chest expands symmetrically. Lungs are clear to auscultation without rales, wheezes, or increased work of breathing. Heart: Normal rate and rhythm. Normal S1 and S2. No gallop, click, or rub.S4 with slurring; no murmur. Abdomen: Bowel sounds normal; abdomen soft and nontender. No masses, organomegaly or hernias noted. Dullness left upper quadrant without splenomegaly. No hepatojugular reflux                                                                                    Musculoskeletal/extremities:  No clubbing, cyanosis,  or deformity noted. Range of motion  normal .Tone & strength  normal.Joints normal. Nail health  good. 2+ edema mainly over the dorsum of the feet. No tenderness in the right popliteal space Vascular: Carotid, radial artery, dorsalis pedis and  posterior tibial pulses are full and equal. The pedal pulses are slightly decreased on the edema but palpable. No bruits present. Homans sign is negative bilaterally Neurologic: Alert and oriented x3. Deep tendon reflexes symmetrical and normal.          Skin: Intact without suspicious lesions or rashes. Lymph: No cervical, axillary lymphadenopathy present. Psych: Mood and affect are normal. Normally interactive  Assessment & Plan:  #1 edema, acute on chronic. To date there has been no evidence of hepatorenal insufficiency. She may have venous insufficiency. She does relate the edema to weight gain. Negative venous Doppler 7/8. Repeat hepatorenal function tests pending. Her history of proteinuria should be reevaluated. Long-term compression hose & prn Furosemide may be best options.  #2 atypical chest discomfort with some paroxysmal nocturnal dyspnea variant 7/ 6-7. The history raises the possibility of  PTE or sleep apnea. Consideration will be given to sleep studies if EKG, chest  x-ray, and repeat labs are normal

## 2012-02-19 NOTE — Patient Instructions (Addendum)
If all studies are negative; a sleep study should be considered to rule out occult sleep apnea. Additionally compression hose may be needed long-term. Please try to go on My Chart within the next 24 hours to allow me to release the results directly to you.

## 2012-02-25 ENCOUNTER — Ambulatory Visit (HOSPITAL_BASED_OUTPATIENT_CLINIC_OR_DEPARTMENT_OTHER)
Admission: RE | Admit: 2012-02-25 | Discharge: 2012-02-25 | Disposition: A | Discharge status: Home / Self Care | Payer: 59 | Source: Ambulatory Visit | Attending: Internal Medicine | Admitting: Internal Medicine

## 2012-02-25 DIAGNOSIS — R0789 Other chest pain: Secondary | ICD-10-CM

## 2012-02-25 DIAGNOSIS — R0989 Other specified symptoms and signs involving the circulatory and respiratory systems: Secondary | ICD-10-CM | POA: Insufficient documentation

## 2012-02-25 DIAGNOSIS — R0609 Other forms of dyspnea: Secondary | ICD-10-CM | POA: Insufficient documentation

## 2012-02-25 DIAGNOSIS — R0600 Dyspnea, unspecified: Secondary | ICD-10-CM

## 2012-02-25 DIAGNOSIS — R0602 Shortness of breath: Secondary | ICD-10-CM | POA: Insufficient documentation

## 2012-02-25 DIAGNOSIS — R609 Edema, unspecified: Secondary | ICD-10-CM | POA: Insufficient documentation

## 2012-04-21 ENCOUNTER — Ambulatory Visit (INDEPENDENT_AMBULATORY_CARE_PROVIDER_SITE_OTHER): Payer: 59 | Admitting: Internal Medicine

## 2012-04-21 ENCOUNTER — Encounter: Payer: Self-pay | Admitting: Internal Medicine

## 2012-04-21 VITALS — BP 128/76 | HR 74 | Temp 98.7°F | Wt 233.4 lb

## 2012-04-21 DIAGNOSIS — R609 Edema, unspecified: Secondary | ICD-10-CM

## 2012-04-21 DIAGNOSIS — D259 Leiomyoma of uterus, unspecified: Secondary | ICD-10-CM

## 2012-04-21 DIAGNOSIS — D219 Benign neoplasm of connective and other soft tissue, unspecified: Secondary | ICD-10-CM | POA: Insufficient documentation

## 2012-04-21 MED ORDER — FUROSEMIDE 40 MG PO TABS: 40.0000 mg | ORAL_TABLET | Freq: Every day | ORAL | Status: DC

## 2012-04-21 NOTE — Progress Notes (Signed)
  Subjective:    Patient ID: Allison Compton, female    DOB: 14-May-1966, 46 y.o.   MRN: [id number]  HPI She's been off her furosemide for 3 days; the edema of legs & feet is beginning to recur but not to the degree as previously noted. The only predisposition to her edema is prolonged sitting at her desk; she does plan prolonged running in the near future.She denies any other trigger/exacerbating factors such as excess intake of salt, prolonged standing or travel, or intake of medications associated with edema (amlodipine, steroids).  Her renal function was normal 12/07/11 with a BUN of 13, creatinine 0.7, and GFR of 94.81.     Review of Systems Constitutional: no fatigue;definite sleep  apnea                                                       Cardiovascular: no palpitations; tachycardia; claudication; paroxysmal nocturnal dyspnea Pulmonary:no cough; sputum reduction; exertional dyspnea Endocrinologic:no significant weight change; temperature intolerance; skin/hair/nail changes Vascular: no Calf swelling or pain but some anterior shin swelling Past medical history: She has uterine fibroids which are enlarging. She raises the question of possible compression on the major vasculature.  She had a negative venous Doppler in March of this year; this was done because of pain in the right lower extremity. Family history: GM CVAs         Objective:   Physical Exam General appearance is one of good health and nourishment w/o distress.  Eyes: No conjunctival inflammation or scleral icterus is present.  Neck : normal thyroid; no NVD     Heart:  Normal rate and regular rhythm. S1 and S2 normal without gallop, murmur, click, rub S 4    Lungs:Chest clear to auscultation; no wheezes, rhonchi,rales ,or rubs present.No increased work of breathing.   Abdomen: bowel sounds normal, soft and non-tender without masses, organomegaly or hernias noted.  No guarding or rebound . No HJR  Skin:Warm & dry.   Intact without suspicious lesions or rashes  Lymphatic: No lymphadenopathy is noted about the head, neck, axilla  She has tense edema of both lower extremities. Homans sign is negative             Assessment & Plan:   #1 bilateral lower extremity edema; probable venous insufficiency. This is controlled with furosemide  Plan: I've asked her to discuss the status of the fibroids with her Gynecologist, Dr Chevis Pretty; I would not expect these to compromise venous return but this should be considered.

## 2012-04-21 NOTE — Patient Instructions (Addendum)
Wear  support hose if you are traveling for prolonged periods of  time or working on your feet for prolonged periods of time.

## 2012-09-27 ENCOUNTER — Other Ambulatory Visit: Payer: Self-pay

## 2013-06-18 ENCOUNTER — Other Ambulatory Visit: Payer: Self-pay

## 2014-01-27 NOTE — H&P (Signed)
Allison Compton is an 48 y.o. female with intramural and submucous fibroids on U/S and heavy, irregular bleeding leading to anemia. This has required continuous progestins for only partial control of bleeding.   Pertinent Gynecological History: Menses: flow is excessive with use of many pads or tampons on heaviest days Bleeding: dysfunctional uterine bleeding Contraception: vasectomy DES exposure: denies Blood transfusions: none Sexually transmitted diseases: no past history Previous GYN Procedures: none  Last mammogram: normal Date: 2015 Last pap: normal Date: 2015 OB History: G1, P1   Menstrual History: Menarche age: unknown  No LMP recorded.    Past Medical History  Diagnosis Date  . Hyperlipidemia   . Seasonal affective disorder     PMH of  . 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 Edema of both legs   . Fibroids     Past Surgical History  Procedure Laterality Date  . Cervical dysplasia  2000    LEEP    Family History  Problem Relation Age of Onset  . Diabetes Mother   . Asthma Mother   . Kidney cancer Father   . Hypertension Father   . Hyperlipidemia Maternal Grandmother   . Stroke Maternal Grandmother     mini strokes  . Pancreatic cancer Paternal Grandmother   . Heart failure Paternal Grandmother   . Heart failure Maternal Grandfather     MI @ 24  . Alzheimer's disease Paternal Grandfather   . Seasonal affective disorder Mother   . Seasonal affective disorder Sister   . GI problems Brother     GI bleed  . Clotting disorder Mother     excess platelets    Social History:  reports that she quit smoking about 28 years ago. She does not have any smokeless tobacco history on file. She reports that she drinks about 1.2 ounces of alcohol per week. She reports that she does not use illicit drugs.  Allergies: No Known Allergies  No prescriptions prior to admission    Review of Systems  Constitutional: Negative for fever.    There were  no vitals taken for this visit. Physical Exam  Cardiovascular: Normal rate and regular rhythm.   Respiratory: Effort normal and breath sounds normal.  GI: Soft. Bowel sounds are normal.  Genitourinary:  Uterus 10 weeks size, mobile Adnexa without masses    No results found for this or any previous visit (from the past 24 hour(s)).  No results found.  Assessment/Plan: 48 yo G1P1 with fibroids and menometrorrhagia for LAVH/BSO Risks reviewed including infection, organ damage, bleeding/transfusion-HIV/Hep, DVT/PE, pneumonia, fistula, pelvic pain, abdominal pain, painful intercourse, changes of menopause, return to OR, laparotomy. All questions answered.  TOMBLIN II,JAMES E 01/27/2014, 1:28 PM

## 2014-02-02 ENCOUNTER — Encounter (HOSPITAL_COMMUNITY): Payer: Self-pay | Admitting: Pharmacist

## 2014-02-08 ENCOUNTER — Encounter (HOSPITAL_COMMUNITY)
Admission: RE | Admit: 2014-02-08 | Discharge: 2014-02-08 | Disposition: A | Discharge status: Home / Self Care | Payer: 59 | Source: Ambulatory Visit | Attending: Obstetrics and Gynecology | Admitting: Obstetrics and Gynecology

## 2014-02-08 ENCOUNTER — Encounter (HOSPITAL_COMMUNITY): Payer: Self-pay

## 2014-02-08 HISTORY — DX: Dermatitis, unspecified: L30.9

## 2014-02-08 LAB — CBC
HCT: 35.3 % — ABNORMAL LOW (ref 36.0–46.0)
Hemoglobin: 11.5 g/dL — ABNORMAL LOW (ref 12.0–15.0)
MCH: 26.9 pg (ref 26.0–34.0)
MCHC: 32.6 g/dL (ref 30.0–36.0)
MCV: 82.5 fL (ref 78.0–100.0)
PLATELETS: 255 10*3/uL (ref 150–400)
RBC: 4.28 MIL/uL (ref 3.87–5.11)
RDW: 17.4 % — ABNORMAL HIGH (ref 11.5–15.5)
WBC: 7.2 10*3/uL (ref 4.0–10.5)

## 2014-02-08 LAB — BASIC METABOLIC PANEL
BUN: 11 mg/dL (ref 6–23)
CO2: 27 mEq/L (ref 19–32)
Calcium: 9.2 mg/dL (ref 8.4–10.5)
Chloride: 101 mEq/L (ref 96–112)
Creatinine, Ser: 0.79 mg/dL (ref 0.50–1.10)
Glucose, Bld: 148 mg/dL — ABNORMAL HIGH (ref 70–99)
POTASSIUM: 4 meq/L (ref 3.7–5.3)
SODIUM: 139 meq/L (ref 137–147)

## 2014-02-08 LAB — SURGICAL PCR SCREEN
MRSA, PCR: NEGATIVE
Staphylococcus aureus: NEGATIVE

## 2014-02-08 NOTE — Patient Instructions (Signed)
Allison Compton  02/08/2014   Your procedure is scheduled on:  02/10/14  Enter through the Main Entrance of [facility] [place] up the phone at the desk and dial [phone].   Call this number if you have problems the morning of surgery: [phone]   Remember:   Do not eat food:After Midnight.  Do not drink clear liquids: After Midnight.  Take these medicines the morning of surgery with A SIP OF WATER: NA   Do not wear jewelry, make-up or nail polish.  Do not wear lotions, powders, or perfumes. You may wear deodorant.  Do not shave 48 hours prior to surgery.  Do not bring valuables to the hospital.  [facility] is not   responsible for any belongings or valuables brought to the hospital.  Contacts, dentures or bridgework may not be worn into surgery.  Leave suitcase in the car. After surgery it may be brought to your room.  For patients admitted to the hospital, checkout time is 11:00 AM the day of              discharge.   Patients discharged the day of surgery will not be allowed to drive             home.  Name and phone number of your driver: NA  Special Instructions:      Please read over the following fact sheets that you were given:   Surgical Site Infection Prevention and MRSA Information.

## 2014-02-10 ENCOUNTER — Encounter (HOSPITAL_COMMUNITY): Payer: 59 | Admitting: Anesthesiology

## 2014-02-10 ENCOUNTER — Encounter (HOSPITAL_COMMUNITY)
Admission: RE | Disposition: A | Discharge status: Home / Self Care | Payer: Self-pay | Source: Ambulatory Visit | Attending: Obstetrics and Gynecology

## 2014-02-10 ENCOUNTER — Observation Stay (HOSPITAL_COMMUNITY)
Admission: RE | Admit: 2014-02-10 | Discharge: 2014-02-11 | Disposition: A | Discharge status: Home / Self Care | Payer: 59 | Source: Ambulatory Visit | Attending: Obstetrics and Gynecology | Admitting: Obstetrics and Gynecology

## 2014-02-10 ENCOUNTER — Encounter (HOSPITAL_COMMUNITY): Payer: Self-pay | Admitting: *Deleted

## 2014-02-10 ENCOUNTER — Ambulatory Visit (HOSPITAL_COMMUNITY): Payer: 59 | Admitting: Anesthesiology

## 2014-02-10 DIAGNOSIS — N801 Endometriosis of ovary: Secondary | ICD-10-CM | POA: Insufficient documentation

## 2014-02-10 DIAGNOSIS — Z823 Family history of stroke: Secondary | ICD-10-CM | POA: Insufficient documentation

## 2014-02-10 DIAGNOSIS — N920 Excessive and frequent menstruation with regular cycle: Secondary | ICD-10-CM | POA: Insufficient documentation

## 2014-02-10 DIAGNOSIS — D219 Benign neoplasm of connective and other soft tissue, unspecified: Secondary | ICD-10-CM | POA: Diagnosis present

## 2014-02-10 DIAGNOSIS — Z833 Family history of diabetes mellitus: Secondary | ICD-10-CM | POA: Insufficient documentation

## 2014-02-10 DIAGNOSIS — Z87891 Personal history of nicotine dependence: Secondary | ICD-10-CM | POA: Insufficient documentation

## 2014-02-10 DIAGNOSIS — R609 Edema, unspecified: Secondary | ICD-10-CM | POA: Insufficient documentation

## 2014-02-10 DIAGNOSIS — Z8051 Family history of malignant neoplasm of kidney: Secondary | ICD-10-CM | POA: Insufficient documentation

## 2014-02-10 DIAGNOSIS — E785 Hyperlipidemia, unspecified: Secondary | ICD-10-CM | POA: Insufficient documentation

## 2014-02-10 DIAGNOSIS — D250 Submucous leiomyoma of uterus: Secondary | ICD-10-CM | POA: Insufficient documentation

## 2014-02-10 DIAGNOSIS — D251 Intramural leiomyoma of uterus: Principal | ICD-10-CM | POA: Insufficient documentation

## 2014-02-10 DIAGNOSIS — D252 Subserosal leiomyoma of uterus: Secondary | ICD-10-CM | POA: Insufficient documentation

## 2014-02-10 DIAGNOSIS — N80109 Endometriosis of ovary, unspecified side, unspecified depth: Secondary | ICD-10-CM | POA: Insufficient documentation

## 2014-02-10 DIAGNOSIS — E119 Type 2 diabetes mellitus without complications: Secondary | ICD-10-CM | POA: Insufficient documentation

## 2014-02-10 DIAGNOSIS — Z8249 Family history of ischemic heart disease and other diseases of the circulatory system: Secondary | ICD-10-CM | POA: Insufficient documentation

## 2014-02-10 DIAGNOSIS — N838 Other noninflammatory disorders of ovary, fallopian tube and broad ligament: Secondary | ICD-10-CM | POA: Insufficient documentation

## 2014-02-10 DIAGNOSIS — N9489 Other specified conditions associated with female genital organs and menstrual cycle: Secondary | ICD-10-CM | POA: Insufficient documentation

## 2014-02-10 DIAGNOSIS — D649 Anemia, unspecified: Secondary | ICD-10-CM | POA: Insufficient documentation

## 2014-02-10 HISTORY — PX: LAPAROSCOPIC ASSISTED VAGINAL HYSTERECTOMY: SHX5398

## 2014-02-10 HISTORY — PX: SALPINGOOPHORECTOMY: SHX82

## 2014-02-10 LAB — GLUCOSE, CAPILLARY
GLUCOSE-CAPILLARY: 182 mg/dL — AB (ref 70–99)
Glucose-Capillary: 104 mg/dL — ABNORMAL HIGH (ref 70–99)

## 2014-02-10 SURGERY — HYSTERECTOMY, VAGINAL, LAPAROSCOPY-ASSISTED
Anesthesia: General | Site: Abdomen

## 2014-02-10 MED ORDER — ONDANSETRON HCL 4 MG/2ML IJ SOLN
INTRAMUSCULAR | Status: DC | PRN
  Administered 2014-02-10: 4 mg via INTRAVENOUS

## 2014-02-10 MED ORDER — ONDANSETRON HCL 4 MG/2ML IJ SOLN
INTRAMUSCULAR | Status: AC
  Filled 2014-02-10: qty 2

## 2014-02-10 MED ORDER — DOCUSATE SODIUM 100 MG PO CAPS
100.0000 mg | ORAL_CAPSULE | Freq: Two times a day (BID) | ORAL | Status: DC
  Administered 2014-02-10 – 2014-02-11 (×2): 100 mg via ORAL
  Filled 2014-02-10 (×2): qty 1

## 2014-02-10 MED ORDER — CEFAZOLIN SODIUM-DEXTROSE 2-3 GM-% IV SOLR
2.0000 g | INTRAVENOUS | Status: AC
  Administered 2014-02-10: 2 g via INTRAVENOUS

## 2014-02-10 MED ORDER — HYDROMORPHONE 0.3 MG/ML IV SOLN
INTRAVENOUS | Status: DC
  Administered 2014-02-10: 0.4 mg via INTRAVENOUS
  Administered 2014-02-10: 11:00:00 via INTRAVENOUS
  Administered 2014-02-10: 0.4 mg via INTRAVENOUS
  Administered 2014-02-10: 0.599 mg via INTRAVENOUS
  Administered 2014-02-11: 0.4 mg via INTRAVENOUS
  Administered 2014-02-11: 0.199 mg via INTRAVENOUS
  Filled 2014-02-10: qty 25

## 2014-02-10 MED ORDER — LIDOCAINE HCL (CARDIAC) 20 MG/ML IV SOLN
INTRAVENOUS | Status: DC | PRN
  Administered 2014-02-10: 40 mg via INTRAVENOUS

## 2014-02-10 MED ORDER — SODIUM CHLORIDE 0.9 % IJ SOLN
INTRAMUSCULAR | Status: DC | PRN
  Administered 2014-02-10: 10 mL

## 2014-02-10 MED ORDER — NEOSTIGMINE METHYLSULFATE 10 MG/10ML IV SOLN
INTRAVENOUS | Status: DC | PRN
  Administered 2014-02-10: 2.5 mg via INTRAVENOUS

## 2014-02-10 MED ORDER — 0.9 % SODIUM CHLORIDE (POUR BTL) OPTIME
TOPICAL | Status: DC | PRN
  Administered 2014-02-10: 1000 mL

## 2014-02-10 MED ORDER — OXYCODONE-ACETAMINOPHEN 5-325 MG PO TABS
1.0000 | ORAL_TABLET | ORAL | Status: DC | PRN
  Administered 2014-02-11: 1 via ORAL
  Filled 2014-02-10: qty 1

## 2014-02-10 MED ORDER — HYDROMORPHONE HCL PF 1 MG/ML IJ SOLN
0.2500 mg | INTRAMUSCULAR | Status: DC | PRN
  Administered 2014-02-10 (×2): 0.5 mg via INTRAVENOUS

## 2014-02-10 MED ORDER — HYDROMORPHONE HCL PF 1 MG/ML IJ SOLN
INTRAMUSCULAR | Status: AC
  Administered 2014-02-10: 0.5 mg via INTRAVENOUS
  Filled 2014-02-10: qty 1

## 2014-02-10 MED ORDER — IBUPROFEN 600 MG PO TABS
600.0000 mg | ORAL_TABLET | Freq: Four times a day (QID) | ORAL | Status: DC | PRN
Start: 2014-02-10 — End: 2014-02-11
  Administered 2014-02-10 – 2014-02-11 (×3): 600 mg via ORAL
  Filled 2014-02-10 (×4): qty 1

## 2014-02-10 MED ORDER — ONDANSETRON HCL 4 MG/2ML IJ SOLN
4.0000 mg | Freq: Four times a day (QID) | INTRAMUSCULAR | Status: DC | PRN
Start: 2014-02-10 — End: 2014-02-11

## 2014-02-10 MED ORDER — DIPHENHYDRAMINE HCL 50 MG/ML IJ SOLN: 12.5000 mg | Freq: Four times a day (QID) | INTRAMUSCULAR | Status: DC | PRN

## 2014-02-10 MED ORDER — HYDROMORPHONE HCL PF 1 MG/ML IJ SOLN
INTRAMUSCULAR | Status: AC
  Filled 2014-02-10: qty 1

## 2014-02-10 MED ORDER — SODIUM CHLORIDE 0.9 % IJ SOLN
INTRAMUSCULAR | Status: AC
  Filled 2014-02-10: qty 10

## 2014-02-10 MED ORDER — ALUM & MAG HYDROXIDE-SIMETH 200-200-20 MG/5ML PO SUSP: 30.0000 mL | ORAL | Status: DC | PRN

## 2014-02-10 MED ORDER — FENTANYL CITRATE 0.05 MG/ML IJ SOLN
INTRAMUSCULAR | Status: DC | PRN
  Administered 2014-02-10: 50 ug via INTRAVENOUS
  Administered 2014-02-10 (×2): 100 ug via INTRAVENOUS
  Administered 2014-02-10: 150 ug via INTRAVENOUS
  Administered 2014-02-10: 100 ug via INTRAVENOUS

## 2014-02-10 MED ORDER — HYDROMORPHONE HCL PF 1 MG/ML IJ SOLN
INTRAMUSCULAR | Status: DC | PRN
  Administered 2014-02-10: 1 mg via INTRAVENOUS

## 2014-02-10 MED ORDER — FENTANYL CITRATE 0.05 MG/ML IJ SOLN
INTRAMUSCULAR | Status: AC
  Filled 2014-02-10: qty 5

## 2014-02-10 MED ORDER — MIDAZOLAM HCL 5 MG/5ML IJ SOLN
INTRAMUSCULAR | Status: DC | PRN
  Administered 2014-02-10: 2 mg via INTRAVENOUS

## 2014-02-10 MED ORDER — LIDOCAINE HCL (CARDIAC) 20 MG/ML IV SOLN
INTRAVENOUS | Status: AC
  Filled 2014-02-10: qty 5

## 2014-02-10 MED ORDER — GLYCOPYRROLATE 0.2 MG/ML IJ SOLN
INTRAMUSCULAR | Status: DC | PRN
  Administered 2014-02-10: .5 mg via INTRAVENOUS

## 2014-02-10 MED ORDER — ONDANSETRON HCL 4 MG/2ML IJ SOLN: 4.0000 mg | Freq: Four times a day (QID) | INTRAMUSCULAR | Status: DC | PRN

## 2014-02-10 MED ORDER — NEOSTIGMINE METHYLSULFATE 10 MG/10ML IV SOLN
INTRAVENOUS | Status: AC
  Filled 2014-02-10: qty 1

## 2014-02-10 MED ORDER — PROPOFOL 10 MG/ML IV EMUL
INTRAVENOUS | Status: AC
  Filled 2014-02-10: qty 20

## 2014-02-10 MED ORDER — DEXAMETHASONE SODIUM PHOSPHATE 10 MG/ML IJ SOLN
INTRAMUSCULAR | Status: DC | PRN
  Administered 2014-02-10: 10 mg via INTRAVENOUS

## 2014-02-10 MED ORDER — BUPIVACAINE HCL (PF) 0.5 % IJ SOLN
INTRAMUSCULAR | Status: DC | PRN
  Administered 2014-02-10: 6 mL
  Administered 2014-02-10: 24 mL

## 2014-02-10 MED ORDER — ROCURONIUM BROMIDE 100 MG/10ML IV SOLN
INTRAVENOUS | Status: DC | PRN
  Administered 2014-02-10: 10 mg via INTRAVENOUS
  Administered 2014-02-10: 50 mg via INTRAVENOUS

## 2014-02-10 MED ORDER — MIDAZOLAM HCL 2 MG/2ML IJ SOLN
INTRAMUSCULAR | Status: AC
  Filled 2014-02-10: qty 2

## 2014-02-10 MED ORDER — ONDANSETRON HCL 4 MG PO TABS: 4.0000 mg | ORAL_TABLET | Freq: Four times a day (QID) | ORAL | Status: DC | PRN

## 2014-02-10 MED ORDER — HEPARIN SODIUM (PORCINE) 5000 UNIT/ML IJ SOLN
INTRAMUSCULAR | Status: AC
  Filled 2014-02-10: qty 1

## 2014-02-10 MED ORDER — LACTATED RINGERS IR SOLN
Status: DC | PRN
  Administered 2014-02-10: 3000 mL

## 2014-02-10 MED ORDER — CEFAZOLIN SODIUM-DEXTROSE 2-3 GM-% IV SOLR
INTRAVENOUS | Status: AC
  Filled 2014-02-10: qty 50

## 2014-02-10 MED ORDER — MAGNESIUM HYDROXIDE 400 MG/5ML PO SUSP: 30.0000 mL | Freq: Every day | ORAL | Status: DC | PRN

## 2014-02-10 MED ORDER — NALOXONE HCL 0.4 MG/ML IJ SOLN: 0.4000 mg | INTRAMUSCULAR | Status: DC | PRN

## 2014-02-10 MED ORDER — SODIUM CHLORIDE 0.9 % IJ SOLN: 9.0000 mL | INTRAMUSCULAR | Status: DC | PRN

## 2014-02-10 MED ORDER — PNEUMOCOCCAL VAC POLYVALENT 25 MCG/0.5ML IJ INJ
0.5000 mL | INJECTION | INTRAMUSCULAR | Status: AC
Start: 2014-02-11 — End: 2014-02-11
  Administered 2014-02-11: 0.5 mL via INTRAMUSCULAR
  Filled 2014-02-10: qty 0.5

## 2014-02-10 MED ORDER — DEXAMETHASONE SODIUM PHOSPHATE 10 MG/ML IJ SOLN
INTRAMUSCULAR | Status: AC
  Filled 2014-02-10: qty 1

## 2014-02-10 MED ORDER — LACTATED RINGERS IV SOLN
INTRAVENOUS | Status: DC
  Administered 2014-02-10 – 2014-02-11 (×3): via INTRAVENOUS

## 2014-02-10 MED ORDER — LACTATED RINGERS IV SOLN
INTRAVENOUS | Status: DC
  Administered 2014-02-10 (×2): via INTRAVENOUS

## 2014-02-10 MED ORDER — GLYCOPYRROLATE 0.2 MG/ML IJ SOLN
INTRAMUSCULAR | Status: AC
  Filled 2014-02-10: qty 4

## 2014-02-10 MED ORDER — MENTHOL 3 MG MT LOZG
1.0000 | LOZENGE | OROMUCOSAL | Status: DC | PRN
  Administered 2014-02-10: 3 mg via ORAL
  Filled 2014-02-10: qty 9

## 2014-02-10 MED ORDER — ZOLPIDEM TARTRATE 5 MG PO TABS: 5.0000 mg | ORAL_TABLET | Freq: Every evening | ORAL | Status: DC | PRN

## 2014-02-10 MED ORDER — PROPOFOL 10 MG/ML IV BOLUS
INTRAVENOUS | Status: DC | PRN
  Administered 2014-02-10: 150 mg via INTRAVENOUS

## 2014-02-10 MED ORDER — BUPIVACAINE HCL (PF) 0.5 % IJ SOLN
INTRAMUSCULAR | Status: AC
  Filled 2014-02-10: qty 30

## 2014-02-10 MED ORDER — PANTOPRAZOLE SODIUM 40 MG PO TBEC
40.0000 mg | DELAYED_RELEASE_TABLET | Freq: Every day | ORAL | Status: DC
Start: 2014-02-10 — End: 2014-02-11
  Administered 2014-02-11: 40 mg via ORAL
  Filled 2014-02-10: qty 1

## 2014-02-10 MED ORDER — DIPHENHYDRAMINE HCL 12.5 MG/5ML PO ELIX
12.5000 mg | ORAL_SOLUTION | Freq: Four times a day (QID) | ORAL | Status: DC | PRN
  Filled 2014-02-10: qty 5

## 2014-02-10 SURGICAL SUPPLY — 47 items
ADH SKN CLS APL DERMABOND .7 (GAUZE/BANDAGES/DRESSINGS) ×3
CABLE HIGH FREQUENCY MONO STRZ (ELECTRODE) IMPLANT
CATH ROBINSON RED A/P 16FR (CATHETERS) ×4 IMPLANT
CLOTH BEACON ORANGE TIMEOUT ST (SAFETY) ×4 IMPLANT
CONT PATH 16OZ SNAP LID 3702 (MISCELLANEOUS) ×4 IMPLANT
COVER TABLE BACK 60X90 (DRAPES) ×4 IMPLANT
DECANTER SPIKE VIAL GLASS SM (MISCELLANEOUS) ×6 IMPLANT
DERMABOND ADVANCED (GAUZE/BANDAGES/DRESSINGS) ×1
DERMABOND ADVANCED .7 DNX12 (GAUZE/BANDAGES/DRESSINGS) ×3 IMPLANT
DRSG COVADERM PLUS 2X2 (GAUZE/BANDAGES/DRESSINGS) ×6 IMPLANT
DRSG OPSITE POSTOP 3X4 (GAUZE/BANDAGES/DRESSINGS) ×2 IMPLANT
DURAPREP 26ML APPLICATOR (WOUND CARE) ×4 IMPLANT
ELECT LIGASURE LONG (ELECTRODE) ×2 IMPLANT
ELECT REM PT RETURN 9FT ADLT (ELECTROSURGICAL) ×4
ELECTRODE REM PT RTRN 9FT ADLT (ELECTROSURGICAL) ×1 IMPLANT
FILTER SMOKE EVAC LAPAROSHD (FILTER) ×2 IMPLANT
GLOVE BIO SURGEON STRL SZ8 (GLOVE) ×12 IMPLANT
GLOVE BIOGEL PI IND STRL 6.5 (GLOVE) ×3 IMPLANT
GLOVE BIOGEL PI IND STRL 7.0 (GLOVE) ×8 IMPLANT
GLOVE BIOGEL PI INDICATOR 6.5 (GLOVE) ×1
GLOVE BIOGEL PI INDICATOR 7.0 (GLOVE) ×4
GOWN STRL REUS W/ TWL LRG LVL3 (GOWN DISPOSABLE) ×21 IMPLANT
GOWN STRL REUS W/TWL LRG LVL3 (GOWN DISPOSABLE) ×28
HEMOSTAT SURGICEL 2X14 (HEMOSTASIS) ×2 IMPLANT
NEEDLE INSUFFLATION 120MM (ENDOMECHANICALS) ×4 IMPLANT
NS IRRIG 1000ML POUR BTL (IV SOLUTION) ×4 IMPLANT
PACK LAVH (CUSTOM PROCEDURE TRAY) ×4 IMPLANT
PROTECTOR NERVE ULNAR (MISCELLANEOUS) ×4 IMPLANT
SCISSORS LAP 5X45 EPIX DISP (ENDOMECHANICALS) IMPLANT
SEALER TISSUE G2 CVD JAW 45CM (ENDOMECHANICALS) ×4 IMPLANT
SET CYSTO W/LG BORE CLAMP LF (SET/KITS/TRAYS/PACK) IMPLANT
SET IRRIG TUBING LAPAROSCOPIC (IRRIGATION / IRRIGATOR) ×2 IMPLANT
STRIP CLOSURE SKIN 1/4X3 (GAUZE/BANDAGES/DRESSINGS) IMPLANT
SUT MNCRL 0 MO-4 VIOLET 18 CR (SUTURE) ×8 IMPLANT
SUT MNCRL 0 VIOLET 6X18 (SUTURE) ×3 IMPLANT
SUT MONOCRYL 0 6X18 (SUTURE) ×1
SUT MONOCRYL 0 MO 4 18  CR/8 (SUTURE) ×2
SUT VIC AB 4-0 PS2 27 (SUTURE) ×4 IMPLANT
SUT VICRYL 0 UR6 27IN ABS (SUTURE) ×2 IMPLANT
SYR 20CC LL (SYRINGE) ×2 IMPLANT
SYR 30ML LL (SYRINGE) ×4 IMPLANT
TOWEL OR 17X24 6PK STRL BLUE (TOWEL DISPOSABLE) ×10 IMPLANT
TRAY FOLEY CATH 14FR (SET/KITS/TRAYS/PACK) ×4 IMPLANT
TROCAR XCEL NON-BLD 11X100MML (ENDOMECHANICALS) ×4 IMPLANT
TROCAR XCEL NON-BLD 5MMX100MML (ENDOMECHANICALS) ×4 IMPLANT
WARMER LAPAROSCOPE (MISCELLANEOUS) ×4 IMPLANT
WATER STERILE IRR 1000ML POUR (IV SOLUTION) ×2 IMPLANT

## 2014-02-10 NOTE — Anesthesia Preprocedure Evaluation (Signed)
Anesthesia Evaluation  Patient identified by MRN, date of birth, ID band Patient awake    Reviewed: Allergy & Precautions, H&P , Patient's Chart, lab work & pertinent test results, reviewed documented beta blocker date and time   Airway Mallampati: II  TM Distance: >3 FB Neck ROM: full    Dental no notable dental hx.    Pulmonary former smoker,  breath sounds clear to auscultation  Pulmonary exam normal       Cardiovascular Rhythm:regular Rate:Normal     Neuro/Psych    GI/Hepatic   Endo/Other  diabetes  Renal/GU      Musculoskeletal   Abdominal   Peds  Hematology   Anesthesia Other Findings   Reproductive/Obstetrics                             Anesthesia Physical Anesthesia Plan  ASA: II  Anesthesia Plan: General   Post-op Pain Management:    Induction: Intravenous  Airway Management Planned: Oral ETT  Additional Equipment:   Intra-op Plan:   Post-operative Plan: Extubation in OR  Informed Consent: I have reviewed the patients History and Physical, chart, labs and discussed the procedure including the risks, benefits and alternatives for the proposed anesthesia with the patient or authorized representative who has indicated his/her understanding and acceptance.   Dental Advisory Given and Dental advisory given  Plan Discussed with: CRNA and Surgeon  Anesthesia Plan Comments: (  Discussed general anesthesia, including possible nausea, instrumentation of airway, sore throat,pulmonary aspiration, etc. I asked if the were any outstanding questions, or  concerns before we proceeded. )        Anesthesia Quick Evaluation  

## 2014-02-10 NOTE — Brief Op Note (Signed)
02/10/2014  9:18 AM  PATIENT:  Allison Compton  48 y.o. female  PRE-OPERATIVE DIAGNOSIS:  fibroids  POST-OPERATIVE DIAGNOSIS:  fibroids  PROCEDURE:  Procedure(s): LAPAROSCOPIC ASSISTED VAGINAL HYSTERECTOMY (N/A) SALPINGO OOPHORECTOMY (Bilateral)  SURGEON:  Surgeon(s) and Role:    * Allena Katz, MD - Primary    * Marylynn Pearson, MD - Assisting  PHYSICIAN ASSISTANT:   ASSISTANTS: Adkins   ANESTHESIA:   general  EBL:  Total I/O In: 2000 [I.V.:2000] Out: 300 [Blood:300]  BLOOD ADMINISTERED:none  DRAINS: Urinary Catheter (Foley)   LOCAL MEDICATIONS USED:  MARCAINE    and Amount: 30 ml  SPECIMEN:  Source of Specimen:  uterus,bialteral tubes and ovaries  DISPOSITION OF SPECIMEN:  PATHOLOGY  COUNTS:  YES  TOURNIQUET:  * No tourniquets in log *  DICTATION: .Other Dictation: Dictation Number [id number]  PLAN OF CARE: Admit to inpatient   PATIENT DISPOSITION:  PACU - hemodynamically stable.   Delay start of Pharmacological VTE agent (>24hrs) due to surgical blood loss or risk of bleeding: not applicable

## 2014-02-10 NOTE — Progress Notes (Signed)
No changes to H&P per patient history Reviewed with patient procedure-LAVH/BSO, patient wants both ovaries removed All questions answered.

## 2014-02-10 NOTE — Anesthesia Postprocedure Evaluation (Signed)
Anesthesia Post Note  Patient: Allison Compton  Procedure(s) Performed: Procedure(s) (LRB): LAPAROSCOPIC ASSISTED VAGINAL HYSTERECTOMY (N/A) SALPINGO OOPHORECTOMY (Bilateral)  Anesthesia type: General  Patient location: PACU  Post pain: Pain level controlled  Post assessment: Post-op Vital signs reviewed  Last Vitals:  Filed Vitals:   02/10/14 1007  BP:   Pulse: 67  Temp:   Resp: 14    Post vital signs: Reviewed  Level of consciousness: sedated  Complications: No apparent anesthesia complications

## 2014-02-10 NOTE — Op Note (Signed)
NAMECORNELL, Allison Compton                ACCOUNT NO.:  [id number]  MEDICAL RECORD NO.:  [id number]  LOCATION:  [facility]                          FACILITY:  [facility]  PHYSICIAN:  Daleen Bo. Gaetano Net, M.D. DATE OF BIRTH:  August 24, 1965  DATE OF PROCEDURE:  02/10/2014 DATE OF DISCHARGE:                              OPERATIVE REPORT   PREOPERATIVE DIAGNOSIS:  Uterine leiomyomata.  POSTOPERATIVE DIAGNOSES:  Uterine leiomyomata.  PROCEDURE:  Laparoscopically-assisted vaginal hysterectomy with bilateral salpingo-oophorectomy.  SURGEON:  Daleen Bo. Gaetano Net, M.D.  ASSISTANT:  Marylynn Pearson, MD  ANESTHESIA:  General with endotracheal intubation.  ESTIMATED BLOOD LOSS:  350 mL.  SPECIMEN:  Uterus, bilateral fallopian tubes, and ovaries; all to pathology.  INDICATIONS AND CONSENT:  The patient is a 48 year old patient with increasingly symptomatic uterine leiomyomata.  Details are dictated in history and physical.  Laparoscopically-assisted vaginal hysterectomy with removal of tubes and ovaries is discussed preoperatively. Potential risks and complications have been reviewed with the patient preoperatively including not limited to, infection, organ damage, bleeding requiring transfusion of blood products with HIV and hepatitis acquisition, DVT, PE, pneumonia, laparotomy, return to the operating room, pelvic pain, abdominal pain, and painful intercourse.  Issues of menopause have also been reviewed.  All questions have been answered and consent is signed on the chart.  FINDINGS:  Upper abdomen is grossly normal.  Uterus is about 10-12 weeks in size, smooth in contour.  Posterior cul-de-sac contains a single dark implant of probable endometriosis between the uterosacral ligaments at their cervical insertion.  Pelvic sidewalls are normal.  Tubes and ovaries were normal.  DESCRIPTION OF PROCEDURE:  The patient was taken operating room, where she was identified, placed in dorsal supine position, and  general anesthesia was induced via endotracheal intubation.  She was then placed in dorsal lithotomy  position.  She was prepped, bladder straight catheterized, and a Hulka tenaculum was placed. The uterus as a manipulator and she is draped in a sterile fashion per [facility] protocol.  Time-out has been undertaken.  The infraumbilical and suprapubic areas were injected in the midline with approximately 6 mL of 0.5% plain Marcaine.  A small infraumbilical incision was made and a disposable Veress needle was placed on the first attempt without difficulty.  Two liters of gas were then insufflated under low pressure with good tympany in the right upper quadrant.  Veress needle was removed and 10/11 Xcel bladeless disposable trocar sleeve was placed under direct visualization with the diagnostic laparoscope.  After that, the operative scope was used.  Small suprapubic incision was made and a 5 mm disposable trocar sleeve was placed under direct visualization without difficulty.  The above findings were noted.  Then using the EnSeal bipolar cautery cutting instrument, the right infundibulopelvic ligament was taken down, the round ligaments taken down, and this was extended down the level of vesicouterine peritoneum.  Similar procedure was carried out on the left.  Vesicouterine peritoneum was taken down in cephalad lateral fashion.  Good hemostasis was noted.  Instruments were removed.  Suprapubic trocar sleeve was removed.  Attention was turned to the vagina.  Posterior cul-de-sac was entered sharply and the cervix was circumscribed with unipolar cautery.  Mucosa was advanced sharply and bluntly.  Then using the handheld bipolar LigaSure cautery instrument, the uterosacral ligaments were taken down followed by the bladder pillars, cardinal ligaments, and uterine vessels bilaterally.  The anterior cul-de-sac was also entered without difficulty.  The fundus was then attempted to deliver  posteriorly.  It would not quite fit. Therefore, it was reduced with some morcellation under good direct visualization.  This allowed the remainder of the fundus to be delivered posteriorly without difficulty and the remaining ligaments were taken down with the LigaSure.  All suture be 0 Monocryl, and otherwise designated.  Uterosacral ligaments were plicated at the vaginal cuff bilaterally and then plicated midline with a third suture.  Cuff was closed with figure-of-eights.  Foley catheter was placed in the bladder for clear urine.  Attention was returned to the abdomen and copious irrigation carried out.  Bleeders on the branches of the right uterine vessels are controlled with bipolar cautery.  Inspection under reduced pneumoperitoneum revealed good hemostasis.  Surgicel was back loaded through the laparoscope and placed over this area as well.  The remaining approximately 25 mL of 0.5% plain Marcaine was instilled into the peritoneal cavity.  Suprapubic trocar sleeve was removed, pneumoperitoneum was reduced, and the umbilical trocar sleeve was removed.  Both incisions were closed with interrupted 2-0 Vicryl suture and Dermabond was placed on both.  All counts were correct.  The patient was awakened and taken recovery room in stable condition.     Daleen Bo Gaetano Net, M.D.     JET/MEDQ  D:  02/10/2014  T:  02/10/2014  Job:  [id number]

## 2014-02-10 NOTE — Progress Notes (Signed)
Good pain relief, ambulating, no flatus yet  VSS Afeb UO clear Lungs CTA Cor RRR Abd soft, BS + Ext PAS on  A: Stable  P: Per Orders

## 2014-02-10 NOTE — Addendum Note (Signed)
Addendum created 02/10/14 1637 by Asher Muir, CRNA   Modules edited: Notes Section   Notes Section:  File: [id number]

## 2014-02-10 NOTE — Anesthesia Postprocedure Evaluation (Signed)
Anesthesia Post Note  Patient: Allison Compton  Procedure(s) Performed: Procedure(s) (LRB): LAPAROSCOPIC ASSISTED VAGINAL HYSTERECTOMY (N/A) SALPINGO OOPHORECTOMY (Bilateral)  Anesthesia type: General  Patient location: [facility]  Post pain: Pain level controlled  Post assessment: Post-op Vital signs reviewed  Last Vitals:  Filed Vitals:   02/10/14 1400  BP: 129/64  Pulse: 74  Temp: 36.6 C  Resp: 19    Post vital signs: Reviewed  Level of consciousness: sedated  Complications: No apparent anesthesia complications

## 2014-02-10 NOTE — Transfer of Care (Signed)
Immediate Anesthesia Transfer of Care Note  Patient: Allison Compton  Procedure(s) Performed: Procedure(s): LAPAROSCOPIC ASSISTED VAGINAL HYSTERECTOMY (N/A) SALPINGO OOPHORECTOMY (Bilateral)  Patient Location: PACU  Anesthesia Type:General  Level of Consciousness: awake  Airway & Oxygen Therapy: Patient Spontanous Breathing and Patient connected to nasal cannula oxygen  Post-op Assessment: Report given to PACU RN and Post -op Vital signs reviewed and stable  Post vital signs: stable  Complications: No apparent anesthesia complications

## 2014-02-11 ENCOUNTER — Encounter (HOSPITAL_COMMUNITY): Payer: Self-pay | Admitting: Obstetrics and Gynecology

## 2014-02-11 LAB — CBC
HCT: 28.2 % — ABNORMAL LOW (ref 36.0–46.0)
HEMOGLOBIN: 9.3 g/dL — AB (ref 12.0–15.0)
MCH: 26.9 pg (ref 26.0–34.0)
MCHC: 33 g/dL (ref 30.0–36.0)
MCV: 81.5 fL (ref 78.0–100.0)
PLATELETS: 223 10*3/uL (ref 150–400)
RBC: 3.46 MIL/uL — ABNORMAL LOW (ref 3.87–5.11)
RDW: 17 % — ABNORMAL HIGH (ref 11.5–15.5)
WBC: 15 10*3/uL — ABNORMAL HIGH (ref 4.0–10.5)

## 2014-02-11 MED ORDER — OXYCODONE-ACETAMINOPHEN 5-325 MG PO TABS: 1.0000 | ORAL_TABLET | Freq: Four times a day (QID) | ORAL | Status: DC | PRN

## 2014-02-11 MED ORDER — IBUPROFEN 600 MG PO TABS: 600.0000 mg | ORAL_TABLET | Freq: Four times a day (QID) | ORAL | Status: DC | PRN

## 2014-02-11 NOTE — Progress Notes (Signed)
Feels good, passing flatus, tolerating regular diet, ambulating  VSS Afeb Abd soft, BS+  Results for orders placed during the hospital encounter of 02/10/14 (from the past 24 hour(s))  GLUCOSE, CAPILLARY     Status: Abnormal   Collection Time    02/10/14  9:58 AM      Result Value Ref Range   Glucose-Capillary 182 (*) 70 - 99 mg/dL  CBC     Status: Abnormal   Collection Time    02/11/14  5:15 AM      Result Value Ref Range   WBC 15.0 (*) 4.0 - 10.5 K/uL   RBC 3.46 (*) 3.87 - 5.11 MIL/uL   Hemoglobin 9.3 (*) 12.0 - 15.0 g/dL   HCT 28.2 (*) 36.0 - 46.0 %   MCV 81.5  78.0 - 100.0 fL   MCH 26.9  26.0 - 34.0 pg   MCHC 33.0  30.0 - 36.0 g/dL   RDW 17.0 (*) 11.5 - 15.5 %   Platelets 223  150 - 400 K/uL    A: Satisfactory  P: D/C home after void     Instructions reviewed

## 2014-02-11 NOTE — Progress Notes (Signed)
Pt discharged home with friend... Discharge instructions reviewed with pt and she verbalized understanding... Condition stable... No equipment... Ambulated to car with Townsend Roger, NT.

## 2014-02-11 NOTE — Discharge Summary (Signed)
Physician Discharge Summary  Patient ID: Allison Compton MRN: [id number] DOB/AGE: 50/13/1967 50 y.o.  Admit date: 02/10/2014 Discharge date: 02/11/2014  Admission Diagnoses:fibroids   Discharge Diagnoses:  Active Problems:   Fibroids   Discharged Condition: good  Hospital Course: good resumption of bowel function, good pain relief, ambulating, tolerating regular diet  Consults: None  Significant Diagnostic Studies: labs:  Results for orders placed during the hospital encounter of 02/10/14 (from the past 24 hour(s))  GLUCOSE, CAPILLARY     Status: Abnormal   Collection Time    02/10/14  9:58 AM      Result Value Ref Range   Glucose-Capillary 182 (*) 70 - 99 mg/dL  CBC     Status: Abnormal   Collection Time    02/11/14  5:15 AM      Result Value Ref Range   WBC 15.0 (*) 4.0 - 10.5 K/uL   RBC 3.46 (*) 3.87 - 5.11 MIL/uL   Hemoglobin 9.3 (*) 12.0 - 15.0 g/dL   HCT 28.2 (*) 36.0 - 46.0 %   MCV 81.5  78.0 - 100.0 fL   MCH 26.9  26.0 - 34.0 pg   MCHC 33.0  30.0 - 36.0 g/dL   RDW 17.0 (*) 11.5 - 15.5 %   Platelets 223  150 - 400 K/uL    Treatments: surgery: LAVH, BSO  Discharge Exam: Blood pressure 112/55, pulse 72, temperature 97.9 F (36.6 C), temperature source Oral, resp. rate 16, height 5\' 9"  (1.753 m), weight 98.884 kg (218 lb), SpO2 99.00%. General appearance: alert, cooperative and no distress GI: soft, non-tender; bowel sounds normal; no masses,  no organomegaly  Disposition: Final discharge disposition not confirmed     Medication List    STOP taking these medications       IRON PO     medroxyPROGESTERone 10 MG tablet  Commonly known as:  PROVERA      TAKE these medications       ibuprofen 600 MG tablet  Commonly known as:  ADVIL,MOTRIN  Take 1 tablet (600 mg total) by mouth every 6 (six) hours as needed (mild pain).     oxyCODONE-acetaminophen 5-325 MG per tablet  Commonly known as:  PERCOCET/ROXICET  Take 1-2 tablets by mouth every 6 (six) hours  as needed for severe pain (moderate to severe pain (when tolerating fluids)).         Signed: Ramari Bray II,Burnie Therien E 02/11/2014, 9:20 AM

## 2014-02-24 ENCOUNTER — Other Ambulatory Visit: Payer: Self-pay | Admitting: Internal Medicine

## 2014-02-24 ENCOUNTER — Telehealth: Payer: Self-pay | Admitting: *Deleted

## 2014-02-24 DIAGNOSIS — Z0000 Encounter for general adult medical examination without abnormal findings: Secondary | ICD-10-CM

## 2014-02-24 NOTE — Telephone Encounter (Signed)
Left msg on triage stating she has her cpx next Mon 03/01/14 wanting to come in for labs prior to appt...Allison Compton

## 2014-02-24 NOTE — Telephone Encounter (Signed)
Orders entered

## 2014-02-24 NOTE — Telephone Encounter (Signed)
Pt call bck wanting to come in tomorrow for blood work. pls advise...Allison Compton

## 2014-02-25 NOTE — Telephone Encounter (Signed)
Called pt no answer LMOM md has place orders...Allison Compton

## 2014-02-26 ENCOUNTER — Other Ambulatory Visit (INDEPENDENT_AMBULATORY_CARE_PROVIDER_SITE_OTHER): Payer: 59

## 2014-02-26 DIAGNOSIS — Z0000 Encounter for general adult medical examination without abnormal findings: Secondary | ICD-10-CM

## 2014-02-26 LAB — CBC WITH DIFFERENTIAL/PLATELET
Basophils Absolute: 0 10*3/uL (ref 0.0–0.1)
Basophils Relative: 0.4 % (ref 0.0–3.0)
Eosinophils Absolute: 0.3 10*3/uL (ref 0.0–0.7)
Eosinophils Relative: 3.9 % (ref 0.0–5.0)
HCT: 30.5 % — ABNORMAL LOW (ref 36.0–46.0)
Hemoglobin: 10.2 g/dL — ABNORMAL LOW (ref 12.0–15.0)
LYMPHS ABS: 1.9 10*3/uL (ref 0.7–4.0)
LYMPHS PCT: 22.3 % (ref 12.0–46.0)
MCHC: 33.4 g/dL (ref 30.0–36.0)
MCV: 78.6 fl (ref 78.0–100.0)
Monocytes Absolute: 0.6 10*3/uL (ref 0.1–1.0)
Monocytes Relative: 7.7 % (ref 3.0–12.0)
Neutro Abs: 5.5 10*3/uL (ref 1.4–7.7)
Neutrophils Relative %: 65.7 % (ref 43.0–77.0)
PLATELETS: 299 10*3/uL (ref 150.0–400.0)
RBC: 3.88 Mil/uL (ref 3.87–5.11)
RDW: 16.9 % — AB (ref 11.5–15.5)
WBC: 8.3 10*3/uL (ref 4.0–10.5)

## 2014-02-26 LAB — HEMOGLOBIN A1C: HEMOGLOBIN A1C: 6.3 % (ref 4.6–6.5)

## 2014-02-26 LAB — HEPATIC FUNCTION PANEL
ALK PHOS: 77 U/L (ref 39–117)
ALT: 17 U/L (ref 0–35)
AST: 14 U/L (ref 0–37)
Albumin: 3.6 g/dL (ref 3.5–5.2)
BILIRUBIN TOTAL: 0.3 mg/dL (ref 0.2–1.2)
Bilirubin, Direct: 0.1 mg/dL (ref 0.0–0.3)
Total Protein: 7.1 g/dL (ref 6.0–8.3)

## 2014-02-26 LAB — LIPID PANEL
CHOLESTEROL: 180 mg/dL (ref 0–200)
HDL: 45 mg/dL (ref >39.00)
LDL Cholesterol: 113 mg/dL — ABNORMAL HIGH (ref 0–99)
NonHDL: 135
Total CHOL/HDL Ratio: 4
Triglycerides: 110 mg/dL (ref 0.0–149.0)
VLDL: 22 mg/dL (ref 0.0–40.0)

## 2014-02-26 LAB — TSH: TSH: 3.6 u[IU]/mL (ref 0.35–4.50)

## 2014-03-01 ENCOUNTER — Encounter: Payer: Self-pay | Admitting: Internal Medicine

## 2014-03-01 ENCOUNTER — Ambulatory Visit (INDEPENDENT_AMBULATORY_CARE_PROVIDER_SITE_OTHER): Payer: 59 | Admitting: Internal Medicine

## 2014-03-01 VITALS — BP 130/66 | HR 87 | Temp 98.2°F | Ht 69.0 in | Wt 215.2 lb

## 2014-03-01 DIAGNOSIS — E782 Mixed hyperlipidemia: Secondary | ICD-10-CM

## 2014-03-01 DIAGNOSIS — Z0000 Encounter for general adult medical examination without abnormal findings: Secondary | ICD-10-CM

## 2014-03-01 MED ORDER — HYDROCORTISONE ACE-PRAMOXINE 1-1 % RE FOAM: 1.0000 | Freq: Two times a day (BID) | RECTAL | Status: DC

## 2014-03-01 NOTE — Patient Instructions (Addendum)
A1c assesses average 24 hour  glucose over prior 6-12 weeks.  No Diabetes risk if < 6.1%  "Pre Diabetes" :6.2-6.4 % Good diabetic control: 6.5-7 % Fair diabetic control: 7-8 % Poor diabetic control: greater than 8 % ( except with additional factors such as  advanced age; significant coronary or neurologic disease,etc).  An  A1c of 7 % or less  is the safest goal for you.Most importantly, it is critical to prevent hypoglycemia.  Recheck A1c in 12 months.  Please review Dr Nunzio Cory book Eat, [place] for best  dietary cholesterol information.  Cardiovascular exercise, this can be as simple a program as walking, is recommended 30-45 minutes 3-4 times per week. If you're not exercising you should take 6-8 weeks to build up to this level.    As per the Standard of Care , screening Colonoscopy recommended @ 50 & every 5-10 years thereafter . More frequent monitor would be dictated by family history or findings @ Colonoscopy. Call in October to schedule this.

## 2014-03-01 NOTE — Progress Notes (Signed)
   Subjective:    Patient ID: Allison Compton, female    DOB: 1966/07/31, 48 y.o.   MRN: [id number]  HPI  She is here for a physical;acute issues include post op hemorrhoid since TAH/BSO 02/10/14.  A heart healthy diet is followed; no regular exercise.  Family history is negative for premature coronary disease. Advanced cholesterol testing reveals  LDL goal is less than 100 ; ideally < 70. No statin since weight loss corrected LDL in 2008.  Low dose ASA not taken.  Review of Systems  Specifically denied are  chest pain, palpitations, dyspnea, or claudication.  Polyuria, polyphagia, polydipsia absent. There is no blurred vision, double vision, or loss of vision.  Also denied are numbness, tingling, or burning of the extremities. No nonhealing skin lesions present. Weight is stable.      Objective:   Physical Exam Gen.: Healthy and well-nourished in appearance. Alert, appropriate and cooperative throughout exam. Appears younger than stated age  Head: Normocephalic without obvious abnormalities  Eyes: No corneal or conjunctival inflammation noted. Pupils equal round reactive to light and accommodation. Extraocular motion intact.  Ears: External  ear exam reveals no significant lesions or deformities. Canals clear .TMs normal. Hearing is grossly normal bilaterally. Nose: External nasal exam reveals no deformity or inflammation. Nasal mucosa are pink and moist. No lesions or exudates noted.   Mouth: Oral mucosa and oropharynx reveal no lesions or exudates. Teeth in good repair. Neck: No deformities, masses, or tenderness noted. Range of motion & Thyroid normal Lungs: Normal respiratory effort; chest expands symmetrically. Lungs are clear to auscultation without rales, wheezes, or increased work of breathing. Heart: Normal rate and rhythm. Normal S1 and S2. No gallop, click, or rub. No murmur. Abdomen: Bowel sounds normal; abdomen soft and nontender. No masses, organomegaly or hernias  noted. Genitalia: as per Gyn                                  Musculoskeletal/extremities: No deformity or scoliosis noted of  the thoracic or lumbar spine.  No clubbing, cyanosis, edema, or significant extremity  deformity noted. Slight lipidema. Range of motion normal .Tone & strength normal. Hand joints normal.  Fingernail health good. Able to lie down & sit up w/o help. Negative SLR bilaterally Vascular: Carotid, radial artery, dorsalis pedis and  posterior tibial pulses are full and equal. No bruits present. Neurologic: Alert and oriented x3. Deep tendon reflexes symmetrical and normal.  Gait normal .      Skin: Intact without suspicious lesions or rashes. Lymph: No cervical, axillary lymphadenopathy present. Psych: Mood and affect are normal. Normally interactive                                                                                        Assessment & Plan:  #1 comprehensive physical exam; no acute findings  Plan: see Orders  & Recommendations

## 2014-03-01 NOTE — Progress Notes (Signed)
Pre visit review using our clinic review tool, if applicable. No additional management support is needed unless otherwise documented below in the visit note. 

## 2014-03-05 ENCOUNTER — Telehealth: Payer: Self-pay

## 2014-03-05 ENCOUNTER — Other Ambulatory Visit: Payer: Self-pay | Admitting: Internal Medicine

## 2014-03-05 DIAGNOSIS — R7309 Other abnormal glucose: Secondary | ICD-10-CM

## 2014-03-05 DIAGNOSIS — E785 Hyperlipidemia, unspecified: Secondary | ICD-10-CM

## 2014-03-05 NOTE — Telephone Encounter (Signed)
Mailed along with labs

## 2014-03-05 NOTE — Telephone Encounter (Signed)
Message copied by Shelly Coss on Fri Mar 05, 2014  8:10 AM ------      Message from: Hendricks Limes      Created: Fri Mar 05, 2014  7:43 AM       Computer problems 7/17; second set of labs I can not release. Attach comments & mail please.      A1c assesses average 24 hour  glucose over prior 6-12 weeks.       No Diabetes risk if < 6.1%       "Pre Diabetes" :6.2-6.4 %      Good diabetic control: 6.5-7 %      Fair diabetic control: 7-8 %      Poor diabetic control: greater than 8 % ( except with additional factors such as  advanced age; significant coronary or neurologic disease,etc).       An  A1c of 6.5 % or less  is the safest goal for you.       Please review Dr Nunzio Cory book Eat, [place] for best  dietary cholesterol information.       Cardiovascular exercise, this can be as simple a program as walking, is recommended 30-45 minutes 3-4 times per week. If you're not exercising you should take 6-8 weeks to build up to this level.       Please  schedule fasting Labs : NMR Lipoprofile Lipid Panel & A1c after 4 months of dietary & exercise changes to optimally assess LDL risk.  ------

## 2014-03-22 ENCOUNTER — Telehealth: Payer: Self-pay | Admitting: Internal Medicine

## 2014-03-22 NOTE — Telephone Encounter (Signed)
Dr Erskine Emery does mine

## 2014-03-22 NOTE — Telephone Encounter (Signed)
Patient has been advised

## 2014-03-22 NOTE — Telephone Encounter (Signed)
Patient states that she was going to get a specific name from Dr. Linna Darner of someone that he might recommend to do her Colonoscopy. Please advise.

## 2014-05-28 ENCOUNTER — Other Ambulatory Visit: Payer: Self-pay

## 2014-06-29 ENCOUNTER — Encounter: Payer: Self-pay | Admitting: Internal Medicine

## 2015-03-10 ENCOUNTER — Other Ambulatory Visit: Payer: Self-pay | Admitting: Internal Medicine

## 2015-03-10 ENCOUNTER — Telehealth: Payer: Self-pay | Admitting: Internal Medicine

## 2015-03-10 DIAGNOSIS — Z0000 Encounter for general adult medical examination without abnormal findings: Secondary | ICD-10-CM

## 2015-03-10 NOTE — Telephone Encounter (Signed)
Please advise 

## 2015-03-10 NOTE — Telephone Encounter (Signed)
Sorry I can't remember. We can talk about it @ appt. Orders entered

## 2015-03-10 NOTE — Telephone Encounter (Signed)
LVM stating the lab orders were in

## 2015-03-10 NOTE — Telephone Encounter (Signed)
I have scheduled patient for 8/12 for cpe.  Patient is requesting to come in week prior to have labs work done.  Patient also states that Dr. Linna Darner had recommended a certain GI doctor for her to see last year.  She was unable to see this doctor because he could not get her in soon.  Patient ended up seeing a different GI doctor.  She would like to know who Dr. Linna Darner first recommended.

## 2015-03-24 ENCOUNTER — Telehealth: Payer: Self-pay | Admitting: Internal Medicine

## 2015-03-24 NOTE — Telephone Encounter (Signed)
Pt would like to make sure that the A1c is added in the blood orders

## 2015-03-25 ENCOUNTER — Encounter: Payer: Self-pay | Admitting: Internal Medicine

## 2015-03-25 NOTE — Telephone Encounter (Signed)
LVM for pt to call back. Pt has orders in for labs and has not been in to have them drawn. Pt is not diabetic and has had normal Hgb A1C values in 2015

## 2015-04-19 ENCOUNTER — Other Ambulatory Visit (INDEPENDENT_AMBULATORY_CARE_PROVIDER_SITE_OTHER): Payer: 59

## 2015-04-19 ENCOUNTER — Other Ambulatory Visit: Payer: Self-pay | Admitting: Internal Medicine

## 2015-04-19 ENCOUNTER — Ambulatory Visit (INDEPENDENT_AMBULATORY_CARE_PROVIDER_SITE_OTHER): Payer: 59 | Admitting: Internal Medicine

## 2015-04-19 ENCOUNTER — Encounter: Payer: Self-pay | Admitting: Internal Medicine

## 2015-04-19 VITALS — BP 130/78 | HR 65 | Temp 97.9°F | Resp 16 | Ht 69.0 in | Wt 211.0 lb

## 2015-04-19 DIAGNOSIS — I519 Heart disease, unspecified: Principal | ICD-10-CM

## 2015-04-19 DIAGNOSIS — Z0000 Encounter for general adult medical examination without abnormal findings: Secondary | ICD-10-CM

## 2015-04-19 DIAGNOSIS — E039 Hypothyroidism, unspecified: Secondary | ICD-10-CM | POA: Diagnosis not present

## 2015-04-19 DIAGNOSIS — Z0189 Encounter for other specified special examinations: Secondary | ICD-10-CM | POA: Diagnosis not present

## 2015-04-19 DIAGNOSIS — R4689 Other symptoms and signs involving appearance and behavior: Secondary | ICD-10-CM | POA: Insufficient documentation

## 2015-04-19 LAB — LIPID PANEL
CHOLESTEROL: 245 mg/dL — AB (ref 0–200)
HDL: 70.1 mg/dL (ref >39.00)
LDL CALC: 156 mg/dL — AB (ref 0–99)
NonHDL: 174.86
TRIGLYCERIDES: 92 mg/dL (ref 0.0–149.0)
Total CHOL/HDL Ratio: 3
VLDL: 18.4 mg/dL (ref 0.0–40.0)

## 2015-04-19 LAB — HEMOGLOBIN A1C: HEMOGLOBIN A1C: 5.4 % (ref 4.6–6.5)

## 2015-04-19 LAB — CBC WITH DIFFERENTIAL/PLATELET
BASOS PCT: 0.5 % (ref 0.0–3.0)
Basophils Absolute: 0 10*3/uL (ref 0.0–0.1)
EOS PCT: 3.6 % (ref 0.0–5.0)
Eosinophils Absolute: 0.2 10*3/uL (ref 0.0–0.7)
HCT: 38.2 % (ref 36.0–46.0)
Hemoglobin: 13 g/dL (ref 12.0–15.0)
LYMPHS ABS: 2 10*3/uL (ref 0.7–4.0)
Lymphocytes Relative: 31.8 % (ref 12.0–46.0)
MCHC: 34 g/dL (ref 30.0–36.0)
MCV: 84.9 fl (ref 78.0–100.0)
MONO ABS: 0.6 10*3/uL (ref 0.1–1.0)
Monocytes Relative: 9 % (ref 3.0–12.0)
NEUTROS ABS: 3.4 10*3/uL (ref 1.4–7.7)
NEUTROS PCT: 55.1 % (ref 43.0–77.0)
PLATELETS: 256 10*3/uL (ref 150.0–400.0)
RBC: 4.5 Mil/uL (ref 3.87–5.11)
RDW: 14.3 % (ref 11.5–15.5)
WBC: 6.3 10*3/uL (ref 4.0–10.5)

## 2015-04-19 LAB — BASIC METABOLIC PANEL
BUN: 13 mg/dL (ref 6–23)
CHLORIDE: 102 meq/L (ref 96–112)
CO2: 29 meq/L (ref 19–32)
Calcium: 9.8 mg/dL (ref 8.4–10.5)
Creatinine, Ser: 0.83 mg/dL (ref 0.40–1.20)
GFR: 76.83 mL/min (ref >60.00)
GLUCOSE: 101 mg/dL — AB (ref 70–99)
POTASSIUM: 4.3 meq/L (ref 3.5–5.1)
Sodium: 140 mEq/L (ref 135–145)

## 2015-04-19 LAB — HEPATIC FUNCTION PANEL
ALT: 32 U/L (ref 0–35)
AST: 20 U/L (ref 0–37)
Albumin: 4.5 g/dL (ref 3.5–5.2)
Alkaline Phosphatase: 109 U/L (ref 39–117)
BILIRUBIN TOTAL: 0.5 mg/dL (ref 0.2–1.2)
Bilirubin, Direct: 0.1 mg/dL (ref 0.0–0.3)
Total Protein: 7.7 g/dL (ref 6.0–8.3)

## 2015-04-19 LAB — TSH: TSH: 48.31 u[IU]/mL — ABNORMAL HIGH (ref 0.35–4.50)

## 2015-04-19 LAB — T3, FREE: T3, Free: 3.2 pg/mL (ref 2.3–4.2)

## 2015-04-19 MED ORDER — LEVOTHYROXINE SODIUM 50 MCG PO TABS: ORAL_TABLET | ORAL | Status: DC

## 2015-04-19 NOTE — Progress Notes (Signed)
   Subjective:    Patient ID: Allison Compton, female    DOB: [birth year], 49 y.o.   MRN: [id number]  HPI The patient is here for a physical to assess status of active health conditions.  PMH, FH, & Social History reviewed & updated.No change in [place] as recorded.  Prior to the last 6 weeks she had been a heart healthy diet with rare ingestion of red meat and decreased sodium. Her exercise consists of gardening.  She smoked from age [age] up to a pack a day. She has a rare alcoholic beverage.  She does describe fatigue for the last 6 weeks. She has had some tick exposures without a documented tick bite.  She is on no prescription medicine but takes ibuprofen 1-2 times a month for headache or muscle pain following gardening.  Her edema has improved following decreased salt intake.  She has nocturia once nightly.  She's had sweats since she had a hysterectomy in July of last year for dysfunctional menses.  Review of Systems Chest pain, palpitations, tachycardia, exertional dyspnea, paroxysmal nocturnal dyspnea,or claudication  are absent. No unexplained weight loss, abdominal pain, significant dyspepsia, dysphagia, melena, rectal bleeding, or persistently small caliber stools. Dysuria, pyuria, hematuria, frequency,  or polyuria are denied. Change in hair, skin, nails denied. No bowel changes of constipation or diarrhea. No intolerance to heat or cold.    Objective:   Physical Exam  Pertinent or positive findings include: She has trace ankle edema. She has minor crepitus of knees, left greater than right.  General appearance :adequately nourished; in no distress.  Eyes: No conjunctival inflammation or scleral icterus is present.  Oral exam:  Lips and gums are healthy appearing.There is no oropharyngeal erythema or exudate noted. Dental hygiene is good.  Heart:  Normal rate and regular rhythm. S1 and S2 normal without gallop, murmur, click, rub or other extra sounds    Lungs:Chest clear  to auscultation; no wheezes, rhonchi,rales ,or rubs present.No increased work of breathing.   Abdomen: bowel sounds normal, soft and non-tender without masses, organomegaly or hernias noted.  No guarding or rebound.   Vascular : all pulses equal ; no bruits present.  Skin:Warm & dry.  Intact without suspicious lesions or rashes ; no tenting or jaundice   Lymphatic: No lymphadenopathy is noted about the head, neck, axilla.   Neuro: Strength, tone & DTRs normal.      Assessment & Plan:  #1 comprehensive physical exam; no acute findings  Plan: see Orders  & Recommendations

## 2015-04-19 NOTE — Patient Instructions (Signed)
  Your next office appointment will be determined based upon review of your pending labs  and  xrays  Those written interpretation of the lab results and instructions will be transmitted to you by My Chart   Critical results will be called.   Followup as needed for any active or acute issue. Please report any significant change in your symptoms. 

## 2015-04-19 NOTE — Progress Notes (Signed)
Pre visit review using our clinic review tool, if applicable. No additional management support is needed unless otherwise documented below in the visit note. 

## 2015-04-20 LAB — T4, FREE: Free T4: 0.55 ng/dL — ABNORMAL LOW (ref 0.60–1.60)

## 2015-04-23 ENCOUNTER — Encounter: Payer: Self-pay | Admitting: Internal Medicine

## 2015-04-25 ENCOUNTER — Other Ambulatory Visit: Payer: Self-pay | Admitting: Internal Medicine

## 2015-04-25 ENCOUNTER — Other Ambulatory Visit: Payer: Self-pay | Admitting: Emergency Medicine

## 2015-04-25 ENCOUNTER — Telehealth: Payer: Self-pay | Admitting: Internal Medicine

## 2015-04-25 NOTE — Telephone Encounter (Signed)
Patient states she is upset that she did not get a phone call in regards to her labs in regards to her lab work.  Please follow up with patient.

## 2015-04-25 NOTE — Telephone Encounter (Signed)
LVM for pt to call back. Labs are visible on MyChart.

## 2015-04-28 ENCOUNTER — Other Ambulatory Visit: Payer: Self-pay | Admitting: Gynecology

## 2015-04-29 LAB — CYTOLOGY - PAP

## 2015-05-11 ENCOUNTER — Telehealth: Payer: Self-pay | Admitting: Internal Medicine

## 2015-05-11 NOTE — Telephone Encounter (Signed)
Pt called in and said that she would like a referral to Endo and would like Hop to give her a name of a Doc as well ?    Best number [phone]

## 2015-05-12 ENCOUNTER — Other Ambulatory Visit: Payer: Self-pay | Admitting: Internal Medicine

## 2015-05-12 DIAGNOSIS — E039 Hypothyroidism, unspecified: Secondary | ICD-10-CM

## 2015-05-12 NOTE — Telephone Encounter (Signed)
Dr Gherghe 

## 2015-05-12 NOTE — Telephone Encounter (Signed)
Please advise 

## 2015-05-16 NOTE — Telephone Encounter (Signed)
LVM to inform pt.

## 2015-05-30 ENCOUNTER — Ambulatory Visit (INDEPENDENT_AMBULATORY_CARE_PROVIDER_SITE_OTHER): Payer: 59 | Admitting: Internal Medicine

## 2015-05-30 ENCOUNTER — Encounter: Payer: Self-pay | Admitting: Internal Medicine

## 2015-05-30 VITALS — BP 112/64 | HR 71 | Temp 98.0°F | Resp 12 | Ht 68.25 in | Wt 218.0 lb

## 2015-05-30 DIAGNOSIS — E0789 Other specified disorders of thyroid: Secondary | ICD-10-CM

## 2015-05-30 DIAGNOSIS — E039 Hypothyroidism, unspecified: Secondary | ICD-10-CM

## 2015-05-30 LAB — T4, FREE: FREE T4: 0.75 ng/dL (ref 0.60–1.60)

## 2015-05-30 LAB — TSH: TSH: 7.9 u[IU]/mL — AB (ref 0.35–4.50)

## 2015-05-30 LAB — T3, FREE: T3, Free: 3.3 pg/mL (ref 2.3–4.2)

## 2015-05-30 NOTE — Progress Notes (Addendum)
Patient ID: Allison Compton, female   DOB: 1965-10-08, 49 y.o.   MRN: [id number]   HPI  Allison Compton is a 49 y.o.-year-old female, referred by her PCP, Dr. Linna Darner, for management of hypothyroidism.  Pt. has been dx with hypothyroidism in 04/2015; is on Levothyroxine 50 alternating with 25 mcg, taken: - fasting, at 4 am - with water - separated by >30 min from b'fast - at 7 am - + calcium at dinnertime - no iron, PPIs, multivitamins  - B complex vitamins - not today, usually in am  I reviewed pt's thyroid tests: Lab Results  Component Value Date   TSH 48.31* 04/19/2015   TSH 3.60 02/26/2014   TSH 1.31 12/07/2011   TSH 2.48 09/21/2010   TSH 2.23 08/31/2009   TSH 2.10 04/29/2009   TSH 3.48 11/29/2008   TSH 7.03* 09/29/2008   TSH 2.14 10/16/2006   FREET4 0.55* 04/19/2015   FREET4 0.7 11/29/2008   FREET4 0.7 10/16/2006    Pt describes: - + weight gain - + fatigue - no heat or cold intolerance, but has hot flushes after her hysterectomy + BSO - + depression - + constipation - no dry skin - no hair loss  Pt denies feeling nodules in neck, hoarseness, dysphagia/odynophagia, SOB with lying down. Sometimes feels something stuck in her throat.  She has + FH of thyroid disorders in: several cousins. No FH of thyroid cancer.  No h/o radiation tx to head or neck. No recent use of iodine supplements. She was on hCG for weight loss - was also taking Magnesium, an Intestinal herbal formula - stopped. She is usually snaking on seaweed. She was eating more seaweed before the TSH returned high.  She also has a history of hysterectomy in 2015. She has HL.  She has stress-related and diet-related eczema on legs.  ROS: Constitutional: see HPI Eyes: no blurry vision, no xerophthalmia ENT: no sore throat, no nodules palpated in throat, no dysphagia/odynophagia, no hoarseness Cardiovascular: no CP/SOB/palpitations/+ leg swelling Respiratory: no cough/SOB Gastrointestinal: no  N/V/D/C Musculoskeletal: no muscle/joint aches Skin: no rashes Neurological: no tremors/numbness/tingling/dizziness Psychiatric: no depression/anxiety + low libido  Past Medical History  Diagnosis Date  . Hyperlipidemia   . Seasonal affective disorder (HCC)     PMH of  . 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 Edema of both legs   . Fibroids   . Eczema   . Pre-diabetes     Pt states she is "borderline" for yrs.   Past Surgical History  Procedure Laterality Date  . Cervical dysplasia  2000    LEEP  . Cervical biopsy  w/ loop electrode excision  2002  . Laparoscopic assisted vaginal hysterectomy N/A 02/10/2014    Procedure: LAPAROSCOPIC ASSISTED VAGINAL HYSTERECTOMY;  Surgeon: Allena Katz, MD;  Location: [facility];  Service: Gynecology;  Laterality: N/A;  . Salpingoophorectomy Bilateral 02/10/2014    Procedure: SALPINGO OOPHORECTOMY;  Surgeon: Allena Katz, MD;  Location: [facility];  Service: Gynecology;  Laterality: Bilateral;  . Abdominal hysterectomy  02/10/2014    Dr Gaetano Net   Social History   Social History  . Marital Status: Married    Spouse Name: N/A  . Number of Children: 1   Occupational History  . n/a   Social History Main Topics  . Smoking status: Former Smoker    Quit date: 08/13/1985  . Smokeless tobacco: Not on file     Comment: age [age],  up to 1 ppd  . Alcohol Use: Yes     Comment: rarely once a month or less  . Drug Use: No   Current Outpatient Prescriptions on File Prior to Visit  Medication Sig Dispense Refill  . ibuprofen (ADVIL,MOTRIN) 600 MG tablet Take 1 tablet (600 mg total) by mouth every 6 (six) hours as needed (mild pain). 30 tablet 0  . levothyroxine (SYNTHROID, LEVOTHROID) 50 MCG tablet 1/2 qd X 10 days then 1 qd 30 tablet 2   No current facility-administered medications on file prior to visit.   No Known Allergies Family History  Problem Relation Age of Onset  . Diabetes Mother   . Asthma Mother   . Kidney  cancer Father   . Hypertension Father   . Hyperlipidemia Maternal Grandmother   . Stroke Maternal Grandmother 75    mini strokes  . Pancreatic cancer Paternal Grandmother   . Heart failure Paternal Grandmother   . Heart failure Maternal Grandfather     MI @ 77  . Alzheimer's disease Paternal Grandfather   . Seasonal affective disorder Mother   . Seasonal affective disorder Sister   . GI problems Brother     GI bleed  . Clotting disorder Mother     excess platelets   PE: BP 112/64 mmHg  Pulse 71  Temp(Src) 98 F (36.7 C) (Oral)  Resp 12  Ht 5' 8.25" (1.734 m)  Wt 218 lb (98.884 kg)  BMI 32.89 kg/m2  SpO2 98%  LMP 02/06/2012 Wt Readings from Last 3 Encounters:  05/30/15 218 lb (98.884 kg)  04/19/15 211 lb (95.709 kg)  03/01/14 215 lb 3.2 oz (97.614 kg)   Constitutional: overweight, in NAD Eyes: PERRLA, EOMI, no exophthalmos ENT: moist mucous membranes, no thyromegaly, but R thyroid fullness, no cervical lymphadenopathy Cardiovascular: RRR, No MRG, + B LE swelling, pitting Respiratory: CTA B Gastrointestinal: abdomen soft, NT, ND, BS+ Musculoskeletal: no deformities, strength intact in all 4 Skin: moist, warm, no rashes Neurological: no tremor with outstretched hands, DTR normal in all 4  ASSESSMENT: 1. Hypothyroidism - new dx  2. R thyroid fullness  PLAN:  1. Patient with new dx of hypothyroidism, on levothyroxine therapy. She appears euthyroid. She has possible Hashimoto's thyroiditis - we had a long discussion about her Hashimoto thyroiditis diagnosis. I explained that this is an autoimmune disorder, in which she develops antibodies against her own thyroid. The antibodies bind to the thyroid tissue and cause inflammation, and, eventually, destruction of the gland and hypothyroidism. We don't know how long this process can be, it can last from months to years. As of now, based on the last results that I have, her thyroid tests are normal. We will repeat them today,  however. I will also add thyroid antibody levels. - I also explained that thyroid enlargement especially at the beginning of her Hashimoto thyroiditis course is not uncommon, and it has a waxing and waning character.  - We discussed about treatment for Hashimoto thyroiditis, which is actually limited to thyroid hormones in case her TFTs are abnormal. Supplements like selenium has been tried with various results, some showing improvement in the TPO antibodies. However, there are no randomized controlled trials of this are consistent results between trials. We also discussed about ways to improve her immune system (relaxation, diet, exercise, sleep) to reduce the Ab titer and, subsequently, the thyroid inflammation. - upon her questioning, we discussed about positive and negative aspects of using Armour thyroid. I underlined the fact that:  Armour is purified from porcine thyroid glands, which is not without risk for contaminants  Also, the ratio between T4 and T3 in Armour is physiologic for pigs, not for humans.  The short half life of T3 can cause fluctuations in blood levels, which can result in mood swings and heart rhythm abnormalities.  The concentration of the active substances (T4 and T3) can be expected to vary between different Armour lots, which can cause variation in the thyroid function tests.  - she opted to stay on LT4 for now - We discussed about correct intake of levothyroxine, fasting, with water, separated by at least 30 minutes from breakfast, and separated by more than 4 hours from calcium, iron, multivitamins, acid reflux medications (PPIs). She is taking it correctly. - will check thyroid tests today: TSH, free T4, free T3, TPO Ab's and ATA  - she is not eating seaweed for now >> advised her to stay off for now - If the labs abnormal, she will need to return in 6-8 weeks for repeat labs - If the ;labs are normal, I will see her back in 4 months  2. R thyroid fullness - will  check a thyroid U/S  - time spent with the patient: 1 hour, of which >50% was spent in obtaining information about her symptoms, reviewing her previous labs, evaluations, and treatments, counseling her about her condition (please see the discussed topics above), and developing a plan to further investigate it; she had a number of questions which I addressed.  Component     Latest Ref Rng 05/30/2015  TSH     0.35 - 4.50 uIU/mL 7.90 (H)  T3, Free     2.3 - 4.2 pg/mL 3.3  Free T4     0.60 - 1.60 ng/dL 0.75  Thyroperoxidase Ab SerPl-aCnc     <9 IU/mL 782 (H)  Thyroglobulin Ab     <2 IU/mL 2 (H)   New diagnosis of Hashimoto thyroiditis, based on elevated thyroid antibodies. TSH is elevated, therefore, I will advise her to increase her levothyroxine dose to 50 g daily. We'll need to recheck her thyroid tests in 6 weeks.  Thyroid ultrasound: CLINICAL DATA: Thyroid fullness.  EXAM: THYROID ULTRASOUND  TECHNIQUE: Ultrasound examination of the thyroid gland and adjacent soft tissues was performed.  COMPARISON: None.  FINDINGS: Right thyroid lobe  Measurements: 4.7 x 1.8 x 2.1 cm. Right thyroid tissue is mildly heterogeneous without a focal nodule. Thyroid tissue is slightly hypervascular.  Left thyroid lobe  Measurements: 4.9 x 1.8 x 1.8 cm. Mild heterogeneity of the left thyroid lobe without focal nodule.  Isthmus  Thickness: 0.4 cm. No nodules visualized.  Lymphadenopathy  Normal appearing small lymph nodes bilaterally.  IMPRESSION: Thyroid tissue is mildly heterogeneous but no focal nodules.   Electronically Signed By: Markus Daft M.D. On: 06/13/2015 09:46

## 2015-05-30 NOTE — Patient Instructions (Signed)
Please stop at the lab.  Please come back for a follow-up appointment in 4 months.  We will call you with the schedule for the thyroid U/S.  Hypothyroidism Hypothyroidism is a disorder of the thyroid. The thyroid is a large gland that is located in the lower front of the neck. The thyroid releases hormones that control how the body works. With hypothyroidism, the thyroid does not make enough of these hormones. CAUSES Causes of hypothyroidism may include:  Viral infections.  Pregnancy.  Your own defense system (immune system) attacking your thyroid.  Certain medicines.  Birth defects.  Past radiation treatments to your head or neck.  Past treatment with radioactive iodine.  Past surgical removal of part or all of your thyroid.  Problems with the gland that is located in the center of your brain (pituitary). SIGNS AND SYMPTOMS Signs and symptoms of hypothyroidism may include:  Feeling as though you have no energy (lethargy).  Inability to tolerate cold.  Weight gain that is not explained by a change in diet or exercise habits.  Dry skin.  Coarse hair.  Menstrual irregularity.  Slowing of thought processes.  Constipation.  Sadness or depression. DIAGNOSIS  Your health care provider may diagnose hypothyroidism with blood tests and ultrasound tests. TREATMENT Hypothyroidism is treated with medicine that replaces the hormones that your body does not make. After you begin treatment, it may take several weeks for symptoms to go away. HOME CARE INSTRUCTIONS   Take medicines only as directed by your health care provider.  If you start taking any new medicines, tell your health care provider.  Keep all follow-up visits as directed by your health care provider. This is important. As your condition improves, your dosage needs may change. You will need to have blood tests regularly so that your health care provider can watch your condition. SEEK MEDICAL CARE IF:  Your  symptoms do not get better with treatment.  You are taking thyroid replacement medicine and:  You sweat excessively.  You have tremors.  You feel anxious.  You lose weight rapidly.  You cannot tolerate heat.  You have emotional swings.  You have diarrhea.  You feel weak. SEEK IMMEDIATE MEDICAL CARE IF:   You develop chest pain.  You develop an irregular heartbeat.  You develop a rapid heartbeat.   This information is not intended to replace advice given to you by your health care provider. Make sure you discuss any questions you have with your health care provider.   Document Released: 07/30/2005 Document Revised: 08/20/2014 Document Reviewed: 12/15/2013 Elsevier Interactive Patient Education [employer].

## 2015-05-31 LAB — THYROID PEROXIDASE ANTIBODY: Thyroperoxidase Ab SerPl-aCnc: 782 IU/mL — ABNORMAL HIGH (ref <9)

## 2015-05-31 LAB — THYROGLOBULIN ANTIBODY: Thyroglobulin Ab: 2 IU/mL — ABNORMAL HIGH (ref <2)

## 2015-05-31 MED ORDER — LEVOTHYROXINE SODIUM 50 MCG PO TABS: ORAL_TABLET | ORAL | Status: DC

## 2015-06-07 ENCOUNTER — Telehealth: Payer: Self-pay | Admitting: Internal Medicine

## 2015-06-07 NOTE — Telephone Encounter (Signed)
It would be very unusual for those symptoms to be related to the Synthroid, but we can try to back off the dose a little and then try to build it back up. I would suggest to restart alternating 25 with 50 g levothyroxine every other day for the next 2 weeks, and then start 25 g 2 days in a row with 50 g in the third for the next 2 weeks. At the end of these 2 weeks, she can retry the 50 g daily. We will need to repeat a set of labs: TSH and free T4 6 weeks after staying on the final dose.

## 2015-06-07 NOTE — Telephone Encounter (Signed)
Returned pt's call. Pt stated that she has started sx again since increase to taking thyroid med daily (pt stated she had been doing 1/2 tablet every other day). She has dizziness, feels disembodied, must take deep breaths at times (some shortness of breath), no appetite; just a really weird feeling. Pt stated that today is the worst. The sx have been going on for the past 6 days. Pt stated she has 2 new sx; tightness in the neck and throat area; a dull ache on the Left side of her abdomen below her rib cage when she eats. Pt stated she has had a BM daily. Pt is concerned and wants Dr Cruzita Lederer to advise her.

## 2015-06-07 NOTE — Telephone Encounter (Signed)
Patient is having some side affect from medications she think synthroid, please advise

## 2015-06-08 NOTE — Telephone Encounter (Signed)
Called pt and lvm advising her per Dr Gherghe's message. Advised pt to call back with any questions.  

## 2015-06-13 ENCOUNTER — Ambulatory Visit
Admission: RE | Admit: 2015-06-13 | Discharge: 2015-06-13 | Disposition: A | Discharge status: Home / Self Care | Payer: 59 | Source: Ambulatory Visit | Attending: Internal Medicine | Admitting: Internal Medicine

## 2015-07-15 ENCOUNTER — Telehealth: Payer: Self-pay | Admitting: *Deleted

## 2015-07-15 DIAGNOSIS — E039 Hypothyroidism, unspecified: Secondary | ICD-10-CM

## 2015-07-15 NOTE — Telephone Encounter (Signed)
Left msg on triage needing a 90 day sent to her mail servce. Called pt bck no answer LMOM we need name of mail service...Allison Compton

## 2015-07-18 MED ORDER — LEVOTHYROXINE SODIUM 50 MCG PO TABS: ORAL_TABLET | ORAL | Status: DC

## 2015-07-18 NOTE — Telephone Encounter (Signed)
Called pt to verify mail service she uses. Sending script to [facility]...Johny Chess

## 2015-09-30 ENCOUNTER — Ambulatory Visit (INDEPENDENT_AMBULATORY_CARE_PROVIDER_SITE_OTHER): Payer: 59 | Admitting: Internal Medicine

## 2015-09-30 ENCOUNTER — Encounter: Payer: Self-pay | Admitting: Internal Medicine

## 2015-09-30 VITALS — BP 112/64 | HR 71 | Temp 97.8°F | Resp 12 | Wt 217.0 lb

## 2015-09-30 DIAGNOSIS — E039 Hypothyroidism, unspecified: Secondary | ICD-10-CM | POA: Diagnosis not present

## 2015-09-30 DIAGNOSIS — R7303 Prediabetes: Secondary | ICD-10-CM | POA: Diagnosis not present

## 2015-09-30 LAB — HEMOGLOBIN A1C: Hgb A1c MFr Bld: 6 % (ref 4.6–6.5)

## 2015-09-30 LAB — T4, FREE: Free T4: 0.88 ng/dL (ref 0.60–1.60)

## 2015-09-30 LAB — TSH: TSH: 16.71 u[IU]/mL — ABNORMAL HIGH (ref 0.35–4.50)

## 2015-09-30 NOTE — Progress Notes (Signed)
Patient ID: Allison Compton, female   DOB: 1965/11/08, 50 y.o.   MRN: [id number]   HPI  Allison Compton is a 50 y.o.-year-old female, returning for f/u for Hashimoto's hypothyroidism. Last visit 4 mo ago.   She started a low fat-low carb diet (800 calorie per day) + hCG x6 weeks.   Pt. has been dx with hypothyroidism in 04/2015; now on Levothyroxine 50 mcg daily , taken: - fasting, at 4 am - with water - separated by >30 min from b'fast - at 7 am - stopped calcium  - no iron, PPIs, multivitamins  - B complex vitamins - not today, usually in am  I reviewed pt's thyroid tests: Lab Results  Component Value Date   TSH 7.90* 05/30/2015   TSH 48.31* 04/19/2015   TSH 3.60 02/26/2014   TSH 1.31 12/07/2011   TSH 2.48 09/21/2010   TSH 2.23 08/31/2009   TSH 2.10 04/29/2009   TSH 3.48 11/29/2008   TSH 7.03* 09/29/2008   TSH 2.14 10/16/2006   FREET4 0.75 05/30/2015   FREET4 0.55* 04/19/2015   FREET4 0.7 11/29/2008   FREET4 0.7 10/16/2006    At last visit, we dx'ed Hashimoto's thyroiditis: Component     Latest Ref Rng 05/30/2015  Thyroperoxidase Ab SerPl-aCnc     <9 IU/mL 782 (H)  Thyroglobulin Ab     <2 IU/mL 2 (H)   Pt describes: - no weight gain - no fatigue - no heat or cold intolerance, but has hot flushes after her hysterectomy + BSO - no depression - + constipation - no dry skin - no hair loss  Pt denies feeling nodules in neck, hoarseness, dysphagia/odynophagia, SOB with lying down. Sometimes feels something stuck in her throat.  She has + FH of thyroid disorders in: several cousins. No FH of thyroid cancer.  No h/o radiation tx to head or neck. No recent use of iodine supplements.  She also has a history of hysterectomy in 2015. She has HL.  She has stress-related and diet-related eczema on legs.  ROS: Constitutional: see HPI Eyes: no blurry vision, no xerophthalmia ENT: no sore throat, no nodules palpated in throat, no dysphagia/odynophagia, no  hoarseness Cardiovascular: no CP/SOB/palpitations/+ leg swelling Respiratory: no cough/SOB Gastrointestinal: no N/V/D/C Musculoskeletal: no muscle/joint aches Skin: no rashes Neurological: no tremors/numbness/tingling/dizziness  I reviewed pt's medications, allergies, PMH, social hx, family hx, and changes were documented in the history of present illness. Otherwise, unchanged from my initial visit note.  Past Medical History  Diagnosis Date  . Hyperlipidemia   . Seasonal affective disorder (HCC)     PMH of  . 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 Edema of both legs   . Fibroids   . Eczema   . Pre-diabetes     Pt states she is "borderline" for yrs.   Past Surgical History  Procedure Laterality Date  . Cervical dysplasia  2000    LEEP  . Cervical biopsy  w/ loop electrode excision  2002  . Laparoscopic assisted vaginal hysterectomy N/A 02/10/2014    Procedure: LAPAROSCOPIC ASSISTED VAGINAL HYSTERECTOMY;  Surgeon: Allena Katz, MD;  Location: [facility];  Service: Gynecology;  Laterality: N/A;  . Salpingoophorectomy Bilateral 02/10/2014    Procedure: SALPINGO OOPHORECTOMY;  Surgeon: Allena Katz, MD;  Location: [facility];  Service: Gynecology;  Laterality: Bilateral;  . Abdominal hysterectomy  02/10/2014    Dr Gaetano Net   Social History   Social History  .  Marital Status: Married    Spouse Name: N/A  . Number of Children: 1   Occupational History  . n/a   Social History Main Topics  . Smoking status: Former Smoker    Quit date: 08/13/1985  . Smokeless tobacco: Not on file     Comment: age 32-19, up to 1 ppd  . Alcohol Use: Yes     Comment: rarely once a month or less  . Drug Use: No   Current Outpatient Prescriptions on File Prior to Visit  Medication Sig Dispense Refill  . b complex vitamins capsule Take 1 capsule by mouth daily.    . Fish Oil-Cholecalciferol (FISH OIL + D3 PO) Take 1 capsule by mouth daily.    Marland Kitchen ibuprofen (ADVIL,MOTRIN) 600  MG tablet Take 1 tablet (600 mg total) by mouth every 6 (six) hours as needed (mild pain). 30 tablet 0  . levothyroxine (SYNTHROID, LEVOTHROID) 50 MCG tablet Take 1 tablet by mouth daily 90 tablet 1   No current facility-administered medications on file prior to visit.   No Known Allergies Family History  Problem Relation Age of Onset  . Diabetes Mother   . Asthma Mother   . Kidney cancer Father   . Hypertension Father   . Hyperlipidemia Maternal Grandmother   . Stroke Maternal Grandmother 75    mini strokes  . Pancreatic cancer Paternal Grandmother   . Heart failure Paternal Grandmother   . Heart failure Maternal Grandfather     MI @ 35  . Alzheimer's disease Paternal Grandfather   . Seasonal affective disorder Mother   . Seasonal affective disorder Sister   . GI problems Brother     GI bleed  . Clotting disorder Mother     excess platelets   PE: BP 112/64 mmHg  Pulse 71  Temp(Src) 97.8 F (36.6 C) (Oral)  Resp 12  Wt 217 lb (98.431 kg)  SpO2 97%  LMP 02/06/2012 Wt Readings from Last 3 Encounters:  09/30/15 217 lb (98.431 kg)  05/30/15 218 lb (98.884 kg)  04/19/15 211 lb (95.709 kg)   Constitutional: overweight, in NAD Eyes: PERRLA, EOMI, no exophthalmos ENT: moist mucous membranes, no thyromegaly, but R thyroid fullness, no cervical lymphadenopathy Cardiovascular: RRR, No MRG, no LE swelling Respiratory: CTA B Gastrointestinal: abdomen soft, NT, ND, BS+ Musculoskeletal: no deformities, strength intact in all 4 Skin: moist, warm, no rashes Neurological: no tremor with outstretched hands, DTR normal in all 4  ASSESSMENT: 1. Hypothyroidism - new dx  2. R thyroid fullness - Thyroid U/S (06/13/2015): Thyroid tissue is mildly heterogeneous but no focal nodules.  3. H/o Prediabetes Lab Results  Component Value Date   HGBA1C 5.4 04/19/2015   PLAN:  1. Patient with new dx of Hashimoto's hypothyroidism, on levothyroxine therapy. She appears euthyroid and feels  great on her low calorie-low fat diet.  - at last visit, we increased her LT4 to 50 mcg >> feels well on this - We discussed about correct intake of levothyroxine, fasting, with water, separated by at least 30 minutes from breakfast, and separated by more than 4 hours from calcium, iron, multivitamins, acid reflux medications (PPIs). She is taking it correctly. - will check thyroid tests today: TSH, free T4 - If the labs abnormal, she will need to return in 6-8 weeks for repeat labs - If the ;labs are normal, I will see her back in 6 months  2. R thyroid fullness - reviewed thyroid U/S >> normal; probably thyroid rotated rather than enlarged.  3. H/o Prediabetes - per her request, will check a HbA1c  Needs refills sent to [facility]  Component     Latest Ref Rng 09/30/2015  Hemoglobin A1C     4.6 - 6.5 % 6.0  TSH     0.35 - 4.50 uIU/mL 16.71 (H)  T4,Free(Direct)     0.60 - 1.60 ng/dL 0.88   Pt is taking LT4 correctly >> we need to increase LT4 dose >> 75 mcg daily. Repeat TFTs in 6 weeks.

## 2015-09-30 NOTE — Patient Instructions (Addendum)
Please stop at the lab.  Please continue Levothyroxine 50 mcg daily.  Take the thyroid hormone every day, with water, at least 30 minutes before breakfast, separated by at least 4 hours from: - acid reflux medications - calcium - iron - multivitamins  Please come back for a follow-up appointment in 6 months.   

## 2015-10-03 DIAGNOSIS — R7303 Prediabetes: Secondary | ICD-10-CM | POA: Insufficient documentation

## 2015-10-03 MED ORDER — LEVOTHYROXINE SODIUM 75 MCG PO TABS: 75.0000 ug | ORAL_TABLET | Freq: Every day | ORAL | Status: DC

## 2015-10-26 ENCOUNTER — Telehealth: Payer: Self-pay

## 2015-10-26 NOTE — Telephone Encounter (Signed)
LVM for pt to call back as soon as possible.   RE: Flu Vaccine 2016-2017

## 2015-12-13 ENCOUNTER — Telehealth: Payer: Self-pay | Admitting: Internal Medicine

## 2015-12-13 NOTE — Telephone Encounter (Signed)
Pt is asking if we can do an A1C and a Cholesterol and a general metabolic panel if we can put in those orders she would like to come here and have them done for her appt with Dr. Raoul Pitch on 5/11

## 2015-12-13 NOTE — Telephone Encounter (Signed)
The labs are in for thyroid tests, but I would check with Dr. Raoul Pitch for the rest of the labs since she needs them for the appointment with her. I am not sure if she needs any other labs.

## 2015-12-13 NOTE — Telephone Encounter (Signed)
Please read message below and advise.  

## 2015-12-14 NOTE — Telephone Encounter (Signed)
Called pt and she said she is coming to have thyroid labs next week. Pt has not seen Dr Raoul Pitch yet. The staff at [facility] told her that she could not order labs yet, since she has not seen her (this is her new PCP). But that Dr Cruzita Lederer could order these for her. Pt also said she would like an appt to discuss some things after her labs were done. Please advise.

## 2015-12-14 NOTE — Telephone Encounter (Signed)
OK to put in the HbA1c, Lipids and CMP for her pre-visit labs with the new PCP. She needs to make sure she also has the TFTs drawn then (these are in). Please ask her what would she like to discuss to see if we need to move the appt earlier.

## 2015-12-19 ENCOUNTER — Other Ambulatory Visit (INDEPENDENT_AMBULATORY_CARE_PROVIDER_SITE_OTHER): Payer: 59

## 2015-12-19 ENCOUNTER — Other Ambulatory Visit: Payer: Self-pay | Admitting: *Deleted

## 2015-12-19 DIAGNOSIS — E782 Mixed hyperlipidemia: Secondary | ICD-10-CM | POA: Diagnosis not present

## 2015-12-19 DIAGNOSIS — E039 Hypothyroidism, unspecified: Secondary | ICD-10-CM

## 2015-12-19 DIAGNOSIS — Z0000 Encounter for general adult medical examination without abnormal findings: Secondary | ICD-10-CM

## 2015-12-19 DIAGNOSIS — R7303 Prediabetes: Secondary | ICD-10-CM

## 2015-12-19 LAB — COMPREHENSIVE METABOLIC PANEL
ALT: 22 U/L (ref 0–35)
AST: 15 U/L (ref 0–37)
Albumin: 4.4 g/dL (ref 3.5–5.2)
Alkaline Phosphatase: 79 U/L (ref 39–117)
BILIRUBIN TOTAL: 0.4 mg/dL (ref 0.2–1.2)
BUN: 14 mg/dL (ref 6–23)
CALCIUM: 9.6 mg/dL (ref 8.4–10.5)
CO2: 28 meq/L (ref 19–32)
CREATININE: 0.73 mg/dL (ref 0.40–1.20)
Chloride: 102 mEq/L (ref 96–112)
GFR: 88.87 mL/min (ref >60.00)
GLUCOSE: 111 mg/dL — AB (ref 70–99)
Potassium: 4.1 mEq/L (ref 3.5–5.1)
Sodium: 138 mEq/L (ref 135–145)
Total Protein: 7.5 g/dL (ref 6.0–8.3)

## 2015-12-19 LAB — HEMOGLOBIN A1C: HEMOGLOBIN A1C: 6.3 % (ref 4.6–6.5)

## 2015-12-19 LAB — LIPID PANEL
CHOL/HDL RATIO: 4
Cholesterol: 243 mg/dL — ABNORMAL HIGH (ref 0–200)
HDL: 65.6 mg/dL (ref >39.00)
LDL Cholesterol: 153 mg/dL — ABNORMAL HIGH (ref 0–99)
NONHDL: 177.07
TRIGLYCERIDES: 118 mg/dL (ref 0.0–149.0)
VLDL: 23.6 mg/dL (ref 0.0–40.0)

## 2015-12-19 LAB — TSH: TSH: 7.92 u[IU]/mL — AB (ref 0.35–4.50)

## 2015-12-19 LAB — T4, FREE: Free T4: 0.9 ng/dL (ref 0.60–1.60)

## 2015-12-19 NOTE — Telephone Encounter (Signed)
Called pt and advised her per Dr Arman Filter message. Pt voiced understanding. Pt moved appt to this week. [place]. Pt wants to discuss not feeling well, fatigue and weight gain. Be advised.

## 2015-12-22 ENCOUNTER — Encounter: Payer: Self-pay | Admitting: Family Medicine

## 2015-12-22 ENCOUNTER — Ambulatory Visit (INDEPENDENT_AMBULATORY_CARE_PROVIDER_SITE_OTHER): Payer: 59 | Admitting: Family Medicine

## 2015-12-22 VITALS — BP 100/70 | HR 82 | Temp 98.2°F | Resp 18 | Ht 68.0 in | Wt 229.5 lb

## 2015-12-22 DIAGNOSIS — E039 Hypothyroidism, unspecified: Secondary | ICD-10-CM | POA: Diagnosis not present

## 2015-12-22 DIAGNOSIS — Z7689 Persons encountering health services in other specified circumstances: Secondary | ICD-10-CM

## 2015-12-22 DIAGNOSIS — Z7189 Other specified counseling: Secondary | ICD-10-CM | POA: Diagnosis not present

## 2015-12-22 NOTE — Patient Instructions (Signed)
Health Maintenance, Female Adopting a healthy lifestyle and getting preventive care can go a long way to promote health and wellness. Talk with your health care provider about what schedule of regular examinations is right for you. This is a good chance for you to check in with your provider about disease prevention and staying healthy. In between checkups, there are plenty of things you can do on your own. Experts have done a lot of research about which lifestyle changes and preventive measures are most likely to keep you healthy. Ask your health care provider for more information. WEIGHT AND DIET  Eat a healthy diet  Be sure to include plenty of vegetables, fruits, low-fat dairy products, and lean protein.  Do not eat a lot of foods high in solid fats, added sugars, or salt.  Get regular exercise. This is one of the most important things you can do for your health.  Most adults should exercise for at least 150 minutes each week. The exercise should increase your heart rate and make you sweat (moderate-intensity exercise).  Most adults should also do strengthening exercises at least twice a week. This is in addition to the moderate-intensity exercise.  Maintain a healthy weight  Body mass index (BMI) is a measurement that can be used to identify possible weight problems. It estimates body fat based on height and weight. Your health care provider can help determine your BMI and help you achieve or maintain a healthy weight.  For females 20 years of age and older:   A BMI below 18.5 is considered underweight.  A BMI of 18.5 to 24.9 is normal.  A BMI of 25 to 29.9 is considered overweight.  A BMI of 30 and above is considered obese.  Watch levels of cholesterol and blood lipids  You should start having your blood tested for lipids and cholesterol at 52 years of age, then have this test every 5 years.  You may need to have your cholesterol levels checked more often if:  Your lipid  or cholesterol levels are high.  You are older than 52 years of age.  You are at high risk for heart disease.  CANCER SCREENING   Lung Cancer  Lung cancer screening is recommended for adults 55-80 years old who are at high risk for lung cancer because of a history of smoking.  A yearly low-dose CT scan of the lungs is recommended for people who:  Currently smoke.  Have quit within the past 15 years.  Have at least a 30-pack-year history of smoking. A pack year is smoking an average of one pack of cigarettes a day for 1 year.  Yearly screening should continue until it has been 15 years since you quit.  Yearly screening should stop if you develop a health problem that would prevent you from having lung cancer treatment.  Breast Cancer  Practice breast self-awareness. This means understanding how your breasts normally appear and feel.  It also means doing regular breast self-exams. Let your health care provider know about any changes, no matter how small.  If you are in your 20s or 30s, you should have a clinical breast exam (CBE) by a health care provider every 1-3 years as part of a regular health exam.  If you are 40 or older, have a CBE every year. Also consider having a breast X-ray (mammogram) every year.  If you have a family history of breast cancer, talk to your health care provider about genetic screening.  If you   are at high risk for breast cancer, talk to your health care provider about having an MRI and a mammogram every year.  Breast cancer gene (BRCA) assessment is recommended for women who have family members with BRCA-related cancers. BRCA-related cancers include:  Breast.  Ovarian.  Tubal.  Peritoneal cancers.  Results of the assessment will determine the need for genetic counseling and BRCA1 and BRCA2 testing. Cervical Cancer Your health care provider may recommend that you be screened regularly for cancer of the pelvic organs (ovaries, uterus, and  vagina). This screening involves a pelvic examination, including checking for microscopic changes to the surface of your cervix (Pap test). You may be encouraged to have this screening done every 3 years, beginning at age 21.  For women ages 30-65, health care providers may recommend pelvic exams and Pap testing every 3 years, or they may recommend the Pap and pelvic exam, combined with testing for human papilloma virus (HPV), every 5 years. Some types of HPV increase your risk of cervical cancer. Testing for HPV may also be done on women of any age with unclear Pap test results.  Other health care providers may not recommend any screening for nonpregnant women who are considered low risk for pelvic cancer and who do not have symptoms. Ask your health care provider if a screening pelvic exam is right for you.  If you have had past treatment for cervical cancer or a condition that could lead to cancer, you need Pap tests and screening for cancer for at least 20 years after your treatment. If Pap tests have been discontinued, your risk factors (such as having a new sexual partner) need to be reassessed to determine if screening should resume. Some women have medical problems that increase the chance of getting cervical cancer. In these cases, your health care provider may recommend more frequent screening and Pap tests. Colorectal Cancer  This type of cancer can be detected and often prevented.  Routine colorectal cancer screening usually begins at 52 years of age and continues through 52 years of age.  Your health care provider may recommend screening at an earlier age if you have risk factors for colon cancer.  Your health care provider may also recommend using home test kits to check for hidden blood in the stool.  A small camera at the end of a tube can be used to examine your colon directly (sigmoidoscopy or colonoscopy). This is done to check for the earliest forms of colorectal  cancer.  Routine screening usually begins at age 50.  Direct examination of the colon should be repeated every 5-10 years through 52 years of age. However, you may need to be screened more often if early forms of precancerous polyps or small growths are found. Skin Cancer  Check your skin from head to toe regularly.  Tell your health care provider about any new moles or changes in moles, especially if there is a change in a mole's shape or color.  Also tell your health care provider if you have a mole that is larger than the size of a pencil eraser.  Always use sunscreen. Apply sunscreen liberally and repeatedly throughout the day.  Protect yourself by wearing long sleeves, pants, a wide-brimmed hat, and sunglasses whenever you are outside. HEART DISEASE, DIABETES, AND HIGH BLOOD PRESSURE   High blood pressure causes heart disease and increases the risk of stroke. High blood pressure is more likely to develop in:  People who have blood pressure in the high end   of the normal range (130-139/85-89 mm Hg).  People who are overweight or obese.  People who are African American.  If you are 38-23 years of age, have your blood pressure checked every 3-5 years. If you are 61 years of age or older, have your blood pressure checked every year. You should have your blood pressure measured twice--once when you are at a hospital or clinic, and once when you are not at a hospital or clinic. Record the average of the two measurements. To check your blood pressure when you are not at a hospital or clinic, you can use:  An automated blood pressure machine at a pharmacy.  A home blood pressure monitor.  If you are between 45 years and 39 years old, ask your health care provider if you should take aspirin to prevent strokes.  Have regular diabetes screenings. This involves taking a blood sample to check your fasting blood sugar level.  If you are at a normal weight and have a low risk for diabetes,  have this test once every three years after 52 years of age.  If you are overweight and have a high risk for diabetes, consider being tested at a younger age or more often. PREVENTING INFECTION  Hepatitis B  If you have a higher risk for hepatitis B, you should be screened for this virus. You are considered at high risk for hepatitis B if:  You were born in a country where hepatitis B is common. Ask your health care provider which countries are considered high risk.  Your parents were born in a high-risk country, and you have not been immunized against hepatitis B (hepatitis B vaccine).  You have HIV or AIDS.  You use needles to inject street drugs.  You live with someone who has hepatitis B.  You have had sex with someone who has hepatitis B.  You get hemodialysis treatment.  You take certain medicines for conditions, including cancer, organ transplantation, and autoimmune conditions. Hepatitis C  Blood testing is recommended for:  Everyone born from [birth year] through 1967.  Anyone with known risk factors for hepatitis C. Sexually transmitted infections (STIs)  You should be screened for sexually transmitted infections (STIs) including gonorrhea and chlamydia if:  You are sexually active and are younger than 52 years of age.  You are older than 53 years of age and your health care provider tells you that you are at risk for this type of infection.  Your sexual activity has changed since you were last screened and you are at an increased risk for chlamydia or gonorrhea. Ask your health care provider if you are at risk.  If you do not have HIV, but are at risk, it may be recommended that you take a prescription medicine daily to prevent HIV infection. This is called pre-exposure prophylaxis (PrEP). You are considered at risk if:  You are sexually active and do not regularly use condoms or know the HIV status of your partner(s).  You take drugs by injection.  You are sexually  active with a partner who has HIV. Talk with your health care provider about whether you are at high risk of being infected with HIV. If you choose to begin PrEP, you should first be tested for HIV. You should then be tested every 3 months for as long as you are taking PrEP.  PREGNANCY   If you are premenopausal and you may become pregnant, ask your health care provider about preconception counseling.  If you may  become pregnant, take 400 to 800 micrograms (mcg) of folic acid every day.  If you want to prevent pregnancy, talk to your health care provider about birth control (contraception). OSTEOPOROSIS AND MENOPAUSE   Osteoporosis is a disease in which the bones lose minerals and strength with aging. This can result in serious bone fractures. Your risk for osteoporosis can be identified using a bone density scan.  If you are 61 years of age or older, or if you are at risk for osteoporosis and fractures, ask your health care provider if you should be screened.  Ask your health care provider whether you should take a calcium or vitamin D supplement to lower your risk for osteoporosis.  Menopause may have certain physical symptoms and risks.  Hormone replacement therapy may reduce some of these symptoms and risks. Talk to your health care provider about whether hormone replacement therapy is right for you.  HOME CARE INSTRUCTIONS   Schedule regular health, dental, and eye exams.  Stay current with your immunizations.   Do not use any tobacco products including cigarettes, chewing tobacco, or electronic cigarettes.  If you are pregnant, do not drink alcohol.  If you are breastfeeding, limit how much and how often you drink alcohol.  Limit alcohol intake to no more than 1 drink per day for nonpregnant women. One drink equals 12 ounces of beer, 5 ounces of wine, or 1 ounces of hard liquor.  Do not use street drugs.  Do not share needles.  Ask your health care provider for help if  you need support or information about quitting drugs.  Tell your health care provider if you often feel depressed.  Tell your health care provider if you have ever been abused or do not feel safe at home.   This information is not intended to replace advice given to you by your health care provider. Make sure you discuss any questions you have with your health care provider.   Document Released: 02/12/2011 Document Revised: 08/20/2014 Document Reviewed: 07/01/2013 Elsevier Interactive Patient Education [employer].

## 2015-12-22 NOTE — Progress Notes (Signed)
Patient ID: Allison Compton, female   DOB: 07/04/1966, 50 y.o.   MRN: [id number]      Patient ID: Allison Compton, female  DOB: Jan 07, 1966, 19 y.o.   MRN: [id number]  Subjective:  Allison Compton is a 50 y.o. female present for establishment of care with new doctor in the [place] system.  All past medical history, surgical history, allergies, family history, immunizations, medications and social history were obtained and updated  in the electronic medical record today. All recent labs, ED visits and hospitalizations within the last year were reviewed.  Anal tear: Patient reports history of anal tear she had seen Dr. Nicki Reaper in [facility] for GI, but wants to establish with new GI. He reports he is time that she experienced anal tear with secondary to her dietary changes. She states she now knows how to "deal with it "but will likely need to establish with a new gastroenterologist. She states that she currently does not have an anal tear.   Hypothyroid: Patient has a history of hypothyroidism, treated by endocrine  Dr. Letta Median for Hashimoto's disease. Patient is currently on 75 g of Synthroid, and has repeat labs and follow-up this week with her endocrinologist. Patient endorses weight gain and irregular bowel habits.   Health maintenance:  Colonoscopy: 05/2014, "normal".  Mammogram:  Has gyn at [facility], she is getting yearly mammograms through them. She was going to Dr. Dolly Rias, he just retired. 04/28/2015. Cervical cancer screening: 04/28/2015, UTD also with Dr. Dolly Rias. Hysterectomy. LGSIL, HYST. Immunizations: pt states UTD tdap. Infectious disease screening: HIV completed 2012, hepatitis C completed 2012.   Past Medical History  Diagnosis Date  . Hyperlipidemia   . Seasonal affective disorder (HCC)     PMH of  . 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 Edema of both legs   . Fibroids   . Eczema   . Pre-diabetes     Pt states she is "borderline" for yrs.  .  Hashimoto's disease   . Anal tear    No Known Allergies Past Surgical History  Procedure Laterality Date  . Cervical dysplasia  2000    LEEP  . Cervical biopsy  w/ loop electrode excision  2002  . Laparoscopic assisted vaginal hysterectomy N/A 02/10/2014    Procedure: LAPAROSCOPIC ASSISTED VAGINAL HYSTERECTOMY;  Surgeon: Allena Katz, MD;  Location: [facility];  Service: Gynecology;  Laterality: N/A;  . Salpingoophorectomy Bilateral 02/10/2014    Procedure: SALPINGO OOPHORECTOMY;  Surgeon: Allena Katz, MD;  Location: [facility];  Service: Gynecology;  Laterality: Bilateral;  . Abdominal hysterectomy  02/10/2014    Dr Gaetano Net   Family History  Problem Relation Age of Onset  . Diabetes Mother   . Asthma Mother   . Seasonal affective disorder Mother   . Clotting disorder Mother     excess platelets  . Heart disease Mother   . COPD Mother   . Kidney cancer Father   . Hypertension Father   . Hyperlipidemia Maternal Grandmother   . Stroke Maternal Grandmother 75    mini strokes  . Pancreatic cancer Paternal Grandmother   . Heart failure Paternal Grandmother   . Heart failure Maternal Grandfather     MI @ 29  . Alzheimer's disease Paternal Grandfather   . Seasonal affective disorder Sister   . GI problems Brother     GI bleed  .     Marland Kitchen Heart disease Paternal Uncle  Social History   Social History  . Marital Status: Married    Spouse Name: N/A  . Number of Children: N/A  . Years of Education: N/A   Occupational History  . Not on file.   Social History Main Topics  . Smoking status: Former Smoker    Quit date: 08/13/1985  . Smokeless tobacco: Never Used     Comment: age [age], up to 1 ppd  . Alcohol Use: Yes     Comment: rarely once a month or less  . Drug Use: No  . Sexual Activity: No   Other Topics Concern  . Not on file   Social History Narrative   Married, "[place]" [place]). 1 adult son Allison Compton).    BA degree, Tree surgeon.         Medication List         This list is accurate as of: 12/22/15 11:59 PM.  Always use your most recent med list.               DULCOLAX PO  Take 2 tablets by mouth as needed. Reported on 12/23/2015     FISH OIL + D3 PO  Take 1 capsule by mouth daily.     ibuprofen 600 MG tablet  Commonly known as:  ADVIL,MOTRIN  Take 1 tablet (600 mg total) by mouth every 6 (six) hours as needed (mild pain).     levothyroxine 75 MCG tablet  Commonly known as:  SYNTHROID, LEVOTHROID  Take 1 tablet (75 mcg total) by mouth daily.     polyethylene glycol packet  Commonly known as:  MIRALAX / GLYCOLAX  Take 17 g by mouth daily as needed. Reported on 12/23/2015       ROS: Negative, with the exception of above mentioned in HPI  Objective: BP 100/70 mmHg  Pulse 82  Temp(Src) 98.2 F (36.8 C) (Oral)  Resp 18  Ht 5\' 8"  (1.727 m)  Wt 229 lb 8 oz (104.101 kg)  BMI 34.90 kg/m2  SpO2 95%  LMP 02/06/2012 Gen: Afebrile. No acute distress. Nontoxic in appearance, well-developed, well-nourished, female, talkative, pleasant female HENT: AT. [place].  MMM, no oral lesions Eyes:Pupils Equal Round Reactive to light, Extraocular movements intact,  Conjunctiva without redness, discharge or icterus. Neck/lymp/endocrine: Supple, no lymphadenopathy, no thyromegaly CV: RRR  Chest: CTAB, no wheeze, rhonchi or crackles. Abd: Soft. NTND. BS present.  Skin: No rashes, purpura or petechiae. Warm and well-perfused. Skin intact. Neuro/Msk: Normal gait. PERLA. EOMi. Alert. Oriented x3.   Psych: Normal affect, dress and demeanor. Normal speech. Normal thought content and judgment.   Assessment/plan: Allison Compton is a 50 y.o. female present for new patient establishment ( prior [facility] provider) Hypothyroidism, unspecified hypothyroidism type - Continue to follow with endocrine. Reassured patient that once thyroid is within normal range she will hopefully find it easier to start weight loss regimens. - Labs ordered by another physician  reviewed once completed abnormal.  Follow-up in 4 weeks for acute complaints and review of labs. > 25 minutes spent with patient, >50% of time spent face to face counseling patient and coordinating care.   Return in about 1 year (around 12/21/2016) for CPE.  Electronically signed by: Howard Pouch, DO [place]

## 2015-12-23 ENCOUNTER — Encounter: Payer: Self-pay | Admitting: Internal Medicine

## 2015-12-23 ENCOUNTER — Ambulatory Visit (INDEPENDENT_AMBULATORY_CARE_PROVIDER_SITE_OTHER): Payer: 59 | Admitting: Internal Medicine

## 2015-12-23 VITALS — BP 114/68 | HR 74 | Temp 98.5°F | Resp 12 | Wt 234.0 lb

## 2015-12-23 DIAGNOSIS — R7303 Prediabetes: Secondary | ICD-10-CM

## 2015-12-23 DIAGNOSIS — E039 Hypothyroidism, unspecified: Secondary | ICD-10-CM | POA: Diagnosis not present

## 2015-12-23 MED ORDER — LEVOTHYROXINE SODIUM 100 MCG PO TABS: 100.0000 ug | ORAL_TABLET | Freq: Every day | ORAL | Status: DC

## 2015-12-23 NOTE — Patient Instructions (Addendum)
Please increase Levothyroxine to 100 mcg daily.  Come back for labs in 6-8 weeks.  HAVE FUN IN [place]!

## 2015-12-23 NOTE — Progress Notes (Signed)
Patient ID: Allison Compton, female   DOB: 1965-12-22, 50 y.o.   MRN: [id number]   HPI  Allison Compton is a 50 y.o.-year-old female, returning for f/u for Hashimoto's hypothyroidism. Last visit 4 mo ago.   She established care with Dr. Raoul Pitch.   Pt. has been dx with hypothyroidism in 04/2015; now on Levothyroxine 50 mcg daily , taken: - fasting, at 4 am - with water - separated by >30 min from b'fast - at 7 am - stopped calcium  - no iron, PPIs, multivitamins   I reviewed pt's thyroid tests: Lab Results  Component Value Date   TSH 7.92* 12/19/2015   TSH 16.71* 09/30/2015   TSH 7.90* 05/30/2015   TSH 48.31* 04/19/2015   TSH 3.60 02/26/2014   TSH 1.31 12/07/2011   TSH 2.48 09/21/2010   TSH 2.23 08/31/2009   TSH 2.10 04/29/2009   TSH 3.48 11/29/2008   FREET4 0.90 12/19/2015   FREET4 0.88 09/30/2015   FREET4 0.75 05/30/2015   FREET4 0.55* 04/19/2015   FREET4 0.7 11/29/2008   FREET4 0.7 10/16/2006    At last visit, we dx'ed Hashimoto's thyroiditis: Component     Latest Ref Rng 05/30/2015  Thyroperoxidase Ab SerPl-aCnc     <9 IU/mL 782 (H)  Thyroglobulin Ab     <2 IU/mL 2 (H)   Pt describes: - + weight gain - + fatigue - no heat or cold intolerance, but has hot flushes after her hysterectomy + BSO - no depression - no constipation - no dry skin - no hair loss  Pt denies feeling nodules in neck, hoarseness, dysphagia/odynophagia, SOB with lying down. Sometimes feels something stuck in her throat.  She has + FH of thyroid disorders in: several cousins. No FH of thyroid cancer.  No h/o radiation tx to head or neck. No recent use of iodine supplements.  She also has a history of hysterectomy in 2015. She has HL.   ROS: Constitutional: see HPI Eyes: no blurry vision, no xerophthalmia ENT: no sore throat, no nodules palpated in throat, no dysphagia/odynophagia, no hoarseness Cardiovascular: no CP/SOB/palpitations/+ leg swelling Respiratory: no  cough/SOB Gastrointestinal: no N/V/D/C Musculoskeletal: no muscle/joint aches Skin: no rashes Neurological: no tremors/numbness/tingling/dizziness  I reviewed pt's medications, allergies, PMH, social hx, family hx, and changes were documented in the history of present illness. Otherwise, unchanged from my initial visit note.  Past Medical History  Diagnosis Date  . Hyperlipidemia   . Seasonal affective disorder (HCC)     PMH of  . 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 Edema of both legs   . Fibroids   . Eczema   . Pre-diabetes     Pt states she is "borderline" for yrs.  . Hashimoto's disease    Past Surgical History  Procedure Laterality Date  . Cervical dysplasia  2000    LEEP  . Cervical biopsy  w/ loop electrode excision  2002  . Laparoscopic assisted vaginal hysterectomy N/A 02/10/2014    Procedure: LAPAROSCOPIC ASSISTED VAGINAL HYSTERECTOMY;  Surgeon: Allena Katz, MD;  Location: [facility];  Service: Gynecology;  Laterality: N/A;  . Salpingoophorectomy Bilateral 02/10/2014    Procedure: SALPINGO OOPHORECTOMY;  Surgeon: Allena Katz, MD;  Location: [facility];  Service: Gynecology;  Laterality: Bilateral;  . Abdominal hysterectomy  02/10/2014    Dr Gaetano Net   Social History   Social History  . Marital Status: Married    Spouse Name: N/A  .  Number of Children: 1   Occupational History  . n/a   Social History Main Topics  . Smoking status: Former Smoker    Quit date: 08/13/1985  . Smokeless tobacco: Not on file     Comment: age [age], up to 1 ppd  . Alcohol Use: Yes     Comment: rarely once a month or less  . Drug Use: No   Current Outpatient Prescriptions on File Prior to Visit  Medication Sig Dispense Refill  . Bisacodyl (DULCOLAX PO) Take 2 tablets by mouth as needed.     . Fish Oil-Cholecalciferol (FISH OIL + D3 PO) Take 1 capsule by mouth daily.    Marland Kitchen ibuprofen (ADVIL,MOTRIN) 600 MG tablet Take 1 tablet (600 mg total) by mouth every 6  (six) hours as needed (mild pain). 30 tablet 0  . levothyroxine (SYNTHROID, LEVOTHROID) 75 MCG tablet Take 1 tablet (75 mcg total) by mouth daily. 90 tablet 1  . polyethylene glycol (MIRALAX / GLYCOLAX) packet Take 17 g by mouth daily as needed.      No current facility-administered medications on file prior to visit.   No Known Allergies Family History  Problem Relation Age of Onset  . Diabetes Mother   . Asthma Mother   . Seasonal affective disorder Mother   . Clotting disorder Mother     excess platelets  . Heart disease Mother   . COPD Mother   . Kidney cancer Father   . Hypertension Father   . Hyperlipidemia Maternal Grandmother   . Stroke Maternal Grandmother 75    mini strokes  . Pancreatic cancer Paternal Grandmother   . Heart failure Paternal Grandmother   . Heart failure Maternal Grandfather     MI @ 49  . Alzheimer's disease Paternal Grandfather   . Seasonal affective disorder Sister   . GI problems Brother     GI bleed  .     Marland Kitchen Heart disease Paternal Uncle    PE: BP 114/68 mmHg  Pulse 74  Temp(Src) 98.5 F (36.9 C) (Oral)  Resp 12  Wt 234 lb (106.142 kg)  SpO2 99%  LMP 02/06/2012 Wt Readings from Last 3 Encounters:  12/23/15 234 lb (106.142 kg)  12/22/15 229 lb 8 oz (104.101 kg)  09/30/15 217 lb (98.431 kg)   Constitutional: overweight, in NAD Eyes: PERRLA, EOMI, no exophthalmos ENT: moist mucous membranes, no thyromegaly, but R thyroid fullness, no cervical lymphadenopathy Cardiovascular: RRR, No MRG, no LE swelling Respiratory: CTA B Gastrointestinal: abdomen soft, NT, ND, BS+ Musculoskeletal: no deformities, strength intact in all 4 Skin: moist, warm, no rashes Neurological: no tremor with outstretched hands, DTR normal in all 4  ASSESSMENT: 1. Hypothyroidism - Thyroid U/S (06/13/2015): Thyroid tissue is mildly heterogeneous but no focal nodules.  2. Prediabetes Lab Results  Component Value Date   HGBA1C 6.3 12/19/2015   PLAN:  1.  Patient with Hashimoto's hypothyroidism, on levothyroxine therapy. She c/o fatigue and weight gain and a lot of stress at home (son going to college this year, husband with prostate cancer) and at work (very busy). However, she will go to [place] at the end of the month for 1 month and is very excited about this. - at last visit, we increased her LT4 to 75 mcg >> recent thyroid tests >> TSH 8 >> will increase LT4 from 75 mcg to 100 mcg daily. - We discussed about correct intake of levothyroxine, fasting, with water, separated by at least 30 minutes from breakfast, and separated  by more than 4 hours from calcium, iron, multivitamins, acid reflux medications (PPIs). She is taking it correctly. - she will need to return in 6-8 weeks for repeat labs - I will see her back in 6 months  2. Prediabetes - last HbA1c in the prediabetic range, increased  - will need a recheck in 3-6 mo, but she is prepared to start watching her diet and increase activity - she will need a glucometer >> will give her one at next visit.

## 2015-12-26 ENCOUNTER — Ambulatory Visit (INDEPENDENT_AMBULATORY_CARE_PROVIDER_SITE_OTHER): Payer: 59 | Admitting: Family Medicine

## 2015-12-26 ENCOUNTER — Encounter: Payer: Self-pay | Admitting: Family Medicine

## 2015-12-26 VITALS — BP 121/78 | HR 76 | Temp 98.5°F | Resp 20 | Wt 235.5 lb

## 2015-12-26 DIAGNOSIS — IMO0001 Reserved for inherently not codable concepts without codable children: Secondary | ICD-10-CM | POA: Insufficient documentation

## 2015-12-26 DIAGNOSIS — R600 Localized edema: Secondary | ICD-10-CM | POA: Insufficient documentation

## 2015-12-26 DIAGNOSIS — E782 Mixed hyperlipidemia: Secondary | ICD-10-CM

## 2015-12-26 DIAGNOSIS — R7303 Prediabetes: Secondary | ICD-10-CM | POA: Diagnosis not present

## 2015-12-26 DIAGNOSIS — R635 Abnormal weight gain: Secondary | ICD-10-CM | POA: Diagnosis not present

## 2015-12-26 MED ORDER — FUROSEMIDE 20 MG PO TABS: ORAL_TABLET | ORAL | Status: DC

## 2015-12-26 NOTE — Patient Instructions (Signed)
Continue daily fish oil supplement.  PREP program, look into possibly joining.  Watch diet with lower carbs, sugar. Attempt to meal prep on weekends again. F/U 6 months for repeat cholesterol and a1c.

## 2015-12-26 NOTE — Progress Notes (Signed)
Patient ID: Allison Compton, female   DOB: Jun 03, 1966, 50 y.o.   MRN: [id number]    Allison Compton , 1966/06/22, 50 y.o., female MRN: [id number]  CC: lab results/ edema Subjective:   Bilateral lower extremity edema; patient states she's had bilateral lower extremity edema for many years. She was prescribed Lasix 40 mg in the past and felt that it helped sometimes, but not all the time. She denies shortness of breath, chest pain or orthopnea. She states she's had a cardiovascular workup in the past and it was normal. Do not see record in the EMR of this evaluation. Patient reports never being prescribed compression stockings or having ABI. Patient reports symptoms are worse when eating a low-salt diet or at the end of the day after being on her feet for long periods of time.  HYPERLIPIDEMIA: Patient has held a family history of hyperlipidemia and her disease. She's had elevated cholesterol the past requiring statin medication. She states she prefers not to be on a statin medication at one time had her cholesterol under control without being medicated. She states she used to take fish oil supplementation, however she stopped using that approximately one month ago. She admittedly states that her diet and exercise are not where should be.  Prediabetes: Patient has a history of prediabetes with elevated A1c. Patient's A1c increased from last collection from 6.0-6.3. Patient states that she is not watching her diet and exercise. She is working too many hours and isn't making time for herself. She states when she is really good and watching her sugar and carbohydrate intake she has had a A1c below 6.0. Patient denies any numbness or tingling in her lower extremities. Patient denies any open or nonhealing wounds.  Weight gain/Obesity, Class II, BMI 35-39.9, with comorbidity [facility]): Patient intermittently has fell off the routine of watching her diet and exercising. She states when she becomes motivated she is all  in and she has lost weight in the past. She is hypothyroid currently in her doses are being altered. She recently had an increase in her dose of Synthroid to 100 g daily. Patient is a prediabetic with an A1c of 6.3. He reports taking part in a program for weight loss in the past that decreased her calorie intake to about 800 cal a day and she was able to lose weight. She does admit that she gained the weight back after stopping the program. She feels her current working regimen and taking care of her ill husband is causing increased stress that is also preventing her from losing weight. She states she routinely does not eat bread and pasta that often, but she does crave sugar. She states that she tries to eat healthy, but does taken higher salt meals at times. She has used Wellbutrin in the past to help with her food cravings, but would like to wait before she restarts this medication.   No Known Allergies Social History  Substance Use Topics  . Smoking status: Former Smoker    Quit date: 08/13/1985  . Smokeless tobacco: Never Used     Comment: age [age], up to 1 ppd  . Alcohol Use: Yes     Comment: rarely once a month or less   Past Medical History  Diagnosis Date  . Hyperlipidemia   . Seasonal affective disorder (HCC)     PMH of  . 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 Edema of both legs   .  Fibroids   . Eczema   . Pre-diabetes     Pt states she is "borderline" for yrs.  . Hashimoto's disease    Past Surgical History  Procedure Laterality Date  . Cervical dysplasia  2000    LEEP  . Cervical biopsy  w/ loop electrode excision  2002  . Laparoscopic assisted vaginal hysterectomy N/A 02/10/2014    Procedure: LAPAROSCOPIC ASSISTED VAGINAL HYSTERECTOMY;  Surgeon: Allena Katz, MD;  Location: [facility];  Service: Gynecology;  Laterality: N/A;  . Salpingoophorectomy Bilateral 02/10/2014    Procedure: SALPINGO OOPHORECTOMY;  Surgeon: Allena Katz, MD;  Location:  [facility];  Service: Gynecology;  Laterality: Bilateral;  . Abdominal hysterectomy  02/10/2014    Dr Gaetano Net   Family History  Problem Relation Age of Onset  . Diabetes Mother   . Asthma Mother   . Seasonal affective disorder Mother   . Clotting disorder Mother     excess platelets  . Heart disease Mother   . COPD Mother   . Kidney cancer Father   . Hypertension Father   . Hyperlipidemia Maternal Grandmother   . Stroke Maternal Grandmother 75    mini strokes  . Pancreatic cancer Paternal Grandmother   . Heart failure Paternal Grandmother   . Heart failure Maternal Grandfather     MI @ 1  . Alzheimer's disease Paternal Grandfather   . Seasonal affective disorder Sister   . GI problems Brother     GI bleed  .     Marland Kitchen Heart disease Paternal Uncle      Medication List       This list is accurate as of: 12/26/15  3:58 PM.  Always use your most recent med list.               DULCOLAX PO  Take 2 tablets by mouth as needed. Reported on 12/23/2015     FISH OIL + D3 PO  Take 1 capsule by mouth daily.     ibuprofen 600 MG tablet  Commonly known as:  ADVIL,MOTRIN  Take 1 tablet (600 mg total) by mouth every 6 (six) hours as needed (mild pain).     levothyroxine 100 MCG tablet  Commonly known as:  SYNTHROID, LEVOTHROID  Take 1 tablet (100 mcg total) by mouth daily before breakfast.     polyethylene glycol packet  Commonly known as:  MIRALAX / GLYCOLAX  Take 17 g by mouth daily as needed. Reported on 12/23/2015         ROS: Negative, with the exception of above mentioned in HPI   Objective:  BP 121/78 mmHg  Pulse 76  Temp(Src) 98.5 F (36.9 C)  Resp 20  Wt 235 lb 8 oz (106.822 kg)  SpO2 96%  LMP 02/06/2012 Body mass index is 35.82 kg/(m^2). Gen: Afebrile. No acute distress. Nontoxic in appearance, well-developed, well-nourished, obese Caucasian female. HENT: AT. [place]. Bilateral TM visualized and normal in appearance. MMM, no oral lesions. Eyes:Pupils Equal Round  Reactive to light, Extraocular movements intact,  Conjunctiva without redness, discharge or icterus. Neck/lymp/endocrine: Supple, no lymphadenopathy, no thyromegaly CV: RRR, trace bilateral edema, +2/4 P posterior tibialis pulses Chest: CTAB, no wheeze or crackles. Good air movement, normal resp effort.  Abd: Soft. Obese. NTND. BS present.  Skin: No rashes, purpura or petechiae. Skin intact Neuro: Normal gait. PERLA. EOMi. Alert. Oriented x3  Psych: Normal affect, dress and demeanor. Normal speech. Normal thought content and judgment.  Assessment/Plan: CORTINA HALIM is  a 50 y.o. female present for acute OV for  Bilateral lower extremity edema - Discussed with patient Lasix is not really indicated unless this is cardiac in nature. Patient was given prescription for 30 days just to help her while she is traveling. Discussed use of compression stockings and ordered her a set today. Advised her to use these especially during her long airplane rides and if able to tolerate well walking in [place]. - Compression stockings - furosemide (LASIX) 20 MG tablet; Take 20 mg tab, repeat in 12 hours if not resolved.  Dispense: 30 tablet; Refill: 0 - Stress with patient low salt diet, compression stockings, thyroid control, if symptoms are increasing or do not resolve would consider echocardiogram.  HYPERLIPIDEMIA - Discussed lab values with patient today, mildly elevated total cholesterol, with a high HDL. LDL is mildly elevated as well. Patient had not been taking her fish oil supplementation during the time of collection. Advised her to restart her fish oil supplementation daily. Encouraged her to make dietary changes, low sugar, low carbs, fresh fruits vegetables and lean meats. - reevaluate in 6 months  Prediabetes - A1c increased from 6.0-6.3. Discussed this with patient today.  - Discussed dietary changes and exercise modifications. Patient is to start exercising at least 150 minutes a week. Dietary  management/modifications per above.  - Patient was offered nutrition counseling she would desire after she gets back from [place]. She left you know her decision on this at that time. - Reevaluate in 6 months  Weight gain/obesity/BMI greater than 35 with prediabetes/comorbidity - Discussed with patient she has many barriers against her this time with elevated stress, working greater than 60 hours a week, taking care of her family. She also has hypothyroidism is not yet controlled, and has had just had another increase in her Synthroid. Reassured her that it's going to be easier for her to lose weight once her thyroid is within normal range. A she continues her high motivation, dietary changes, and exercise modifications upon her return from [place] I would hope she would start seeing some weight loss. - Patient was encouraged to meal prep on weekends to have nutritionist/healthy food available to her for lunches and dinners after work. - Exercise greater than 150 minutes a week; prep program information was given to her today.   > 25 minutes spent with patient, >50% of time spent face to face counseling patient and coordinating care.  electronically signed by:  Howard Pouch, DO  [place]

## 2015-12-28 ENCOUNTER — Encounter: Payer: Self-pay | Admitting: Family Medicine

## 2016-01-24 ENCOUNTER — Telehealth: Payer: Self-pay | Admitting: Internal Medicine

## 2016-01-24 MED ORDER — LEVOTHYROXINE SODIUM 100 MCG PO TABS: 100.0000 ug | ORAL_TABLET | Freq: Every day | ORAL | Status: DC

## 2016-01-24 NOTE — Telephone Encounter (Signed)
Rx submitted per pt's request.  

## 2016-01-24 NOTE — Telephone Encounter (Signed)
Patient need a refill of medication sent to Levothyroxine sent to a pharmacy in [place], Can it it be done?

## 2016-02-15 ENCOUNTER — Telehealth: Payer: Self-pay | Admitting: Internal Medicine

## 2016-02-15 ENCOUNTER — Telehealth: Payer: Self-pay

## 2016-02-15 MED ORDER — LEVOTHYROXINE SODIUM 100 MCG PO TABS
ORAL_TABLET | ORAL | Status: DC
Start: 2016-02-15 — End: 2016-05-17

## 2016-02-15 NOTE — Telephone Encounter (Signed)
Returned patient call questioning medication. Patient asking for levothyroxine in brand name and not generic, questioning if dosages needs to be changed due the generic working better? Please advise. Thank you.

## 2016-02-15 NOTE — Telephone Encounter (Signed)
Medication ordered brand name to patient pharmacy. Called patient to notify. Patient asked to have lab work moved to August 16th, so the new medication can kick in. I made her a new appointment for then; wanted to clear it with you before I cancelled the other appointment for a few weeks. Please advise. Thanks!

## 2016-02-15 NOTE — Telephone Encounter (Signed)
Pt believes the generic rx is not working for her can we please change the med

## 2016-02-15 NOTE — Telephone Encounter (Signed)
Ok to change to Synthroid d.a.w. at the same dose. The potencies are the same, but brand name Synthroid can have more consistent dosing between different lots.

## 2016-02-15 NOTE — Telephone Encounter (Signed)
ok 

## 2016-03-12 ENCOUNTER — Other Ambulatory Visit: Payer: 59

## 2016-03-12 ENCOUNTER — Telehealth: Payer: Self-pay | Admitting: Internal Medicine

## 2016-03-12 NOTE — Telephone Encounter (Signed)
Patient is having heart papulation and getting progressively worse, think it is coming from her medication. Please have Dr Cruzita Lederer call her back her back because she want to come in [place]. please

## 2016-03-12 NOTE — Telephone Encounter (Signed)
Let's have her come back for labs first and will see from there - labs are in. I really do not have any openings tomorrow and I will be out of town afterwards until next week. If she on the generic or brand name? We can try the brand name if she didn't switch back to this one.

## 2016-03-13 ENCOUNTER — Telehealth: Payer: Self-pay

## 2016-03-13 ENCOUNTER — Other Ambulatory Visit (INDEPENDENT_AMBULATORY_CARE_PROVIDER_SITE_OTHER): Payer: 59

## 2016-03-13 DIAGNOSIS — E039 Hypothyroidism, unspecified: Secondary | ICD-10-CM | POA: Diagnosis not present

## 2016-03-13 LAB — T4, FREE: FREE T4: 0.92 ng/dL (ref 0.60–1.60)

## 2016-03-13 LAB — TSH: TSH: 4.93 u[IU]/mL — AB (ref 0.35–4.50)

## 2016-03-13 LAB — T3, FREE: T3, Free: 3.6 pg/mL (ref 2.3–4.2)

## 2016-03-13 NOTE — Telephone Encounter (Signed)
Called patient and left message regarding making an appointment for labs also to try and get on the wait list for next week or try and get put into the schedule as Dr.Gherghe will be out of the office the rest of the week. Also advised patient to call back regarding if medication she was taking was brand name or generic name we could try to switch them and see if that helped with the heart palpitations. Advised patient to return phone and gave call back number.

## 2016-03-15 ENCOUNTER — Telehealth: Payer: Self-pay

## 2016-03-15 NOTE — Telephone Encounter (Signed)
Attempted to contact patient again to see how she was doing, no answer. Left message to return phone call to schedule labs and to try and get an earlier appointment.

## 2016-03-21 ENCOUNTER — Other Ambulatory Visit: Payer: Self-pay | Admitting: Internal Medicine

## 2016-03-28 ENCOUNTER — Other Ambulatory Visit: Payer: 59

## 2016-03-29 ENCOUNTER — Ambulatory Visit: Payer: 59 | Admitting: Internal Medicine

## 2016-04-05 ENCOUNTER — Ambulatory Visit (HOSPITAL_BASED_OUTPATIENT_CLINIC_OR_DEPARTMENT_OTHER)
Admission: RE | Admit: 2016-04-05 | Discharge: 2016-04-05 | Disposition: A | Discharge status: Home / Self Care | Payer: 59 | Source: Ambulatory Visit | Attending: Family Medicine | Admitting: Family Medicine

## 2016-04-05 ENCOUNTER — Telehealth: Payer: Self-pay | Admitting: Family Medicine

## 2016-04-05 ENCOUNTER — Ambulatory Visit (INDEPENDENT_AMBULATORY_CARE_PROVIDER_SITE_OTHER): Payer: 59 | Admitting: Family Medicine

## 2016-04-05 ENCOUNTER — Encounter: Payer: Self-pay | Admitting: Family Medicine

## 2016-04-05 VITALS — BP 111/75 | HR 82 | Temp 97.5°F | Resp 20 | Ht 68.0 in | Wt 237.5 lb

## 2016-04-05 DIAGNOSIS — S79911A Unspecified injury of right hip, initial encounter: Secondary | ICD-10-CM | POA: Diagnosis not present

## 2016-04-05 DIAGNOSIS — X58XXXA Exposure to other specified factors, initial encounter: Secondary | ICD-10-CM | POA: Insufficient documentation

## 2016-04-05 DIAGNOSIS — R002 Palpitations: Secondary | ICD-10-CM

## 2016-04-05 DIAGNOSIS — R350 Frequency of micturition: Secondary | ICD-10-CM

## 2016-04-05 LAB — POC URINALSYSI DIPSTICK (AUTOMATED)
BILIRUBIN UA: NEGATIVE
GLUCOSE UA: NEGATIVE
KETONES UA: NEGATIVE
Leukocytes, UA: NEGATIVE
Nitrite, UA: NEGATIVE
Protein, UA: NEGATIVE
SPEC GRAV UA: 1.01
UROBILINOGEN UA: 0.2
pH, UA: 5.5

## 2016-04-05 NOTE — Telephone Encounter (Signed)
Ft message with xray results and instructions on patient voice mail per [facility]

## 2016-04-05 NOTE — Progress Notes (Signed)
Allison Compton , 10-10-1965, 50 y.o., female MRN: [id number] Patient Care Team    Relationship Specialty Notifications Start End  Ma Hillock, DO PCP - General Family Medicine  12/22/15   Philemon Kingdom, MD Consulting Physician Internal Medicine  12/23/15    Comment: prediabetes/thyroid    CC: multiple complaints.  Subjective:   UTI: she has many UTI throughout life. She had urinary frequency over the last week, needing to get up at night and spasms. She denies dysuria, fever, chills, nausea or low back pain. No urine cultured in the system.   Leg injury: right upper thigh injury. She states when she was on her trip. She thinks that lifting her heavy suit case and using her leg to lift it to the trunk caused an injury and she points to the anterior hip joint. She states it was initially so bad she was crying. She has been taking advil. Hip flexion is worse, and now she feels the joint is giving and she is limping. She states it is worse at the end of the day. She had also walked a great deal, including hiking.   Palpitations: pt wants discuss palpitations, she is being worked up by her endocrine for this because it occurred after changes in thyroid medications. She has had them for almost a day once, but has not had any for 3 days. She did feel the urge to cough with palpitations during that day.   No Known Allergies Social History  Substance Use Topics  . Smoking status: Former Smoker    Quit date: 08/13/1985  . Smokeless tobacco: Never Used     Comment: age [age], up to 1 ppd  . Alcohol use Yes     Comment: rarely once a month or less   Past Medical History:  Diagnosis Date  . Anal tear   . 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 Eczema   . Edema of both legs   . Fibroids   . Hashimoto's disease   . Hyperlipidemia   . Pre-diabetes    Pt states she is "borderline" for yrs.  . Seasonal affective disorder ([place])    PMH of   Past Surgical History:    Procedure Laterality Date  . ABDOMINAL HYSTERECTOMY  02/10/2014   Dr Gaetano Net  . CERVICAL BIOPSY  W/ LOOP ELECTRODE EXCISION  2002  . cervical dysplasia  2000   LEEP  . LAPAROSCOPIC ASSISTED VAGINAL HYSTERECTOMY N/A 02/10/2014   Procedure: LAPAROSCOPIC ASSISTED VAGINAL HYSTERECTOMY;  Surgeon: Allena Katz, MD;  Location: [facility];  Service: Gynecology;  Laterality: N/A;  . SALPINGOOPHORECTOMY Bilateral 02/10/2014   Procedure: SALPINGO OOPHORECTOMY;  Surgeon: Allena Katz, MD;  Location: [facility];  Service: Gynecology;  Laterality: Bilateral;   Family History  Problem Relation Age of Onset  . Diabetes Mother   . Asthma Mother   . Seasonal affective disorder Mother   . Clotting disorder Mother     excess platelets  . Heart disease Mother   . COPD Mother   . Kidney cancer Father   . Hypertension Father   . Hyperlipidemia Maternal Grandmother   . Stroke Maternal Grandmother 75    mini strokes  . Pancreatic cancer Paternal Grandmother   . Heart failure Paternal Grandmother   . Heart failure Maternal Grandfather     MI @ 71  . Alzheimer's disease Paternal Grandfather   . Seasonal affective disorder Sister   .  GI problems Brother     GI bleed  .     Marland Kitchen Heart disease Paternal Uncle      Medication List       Accurate as of 04/05/16 10:01 AM. Always use your most recent med list.          FISH OIL + D3 PO Take 1 capsule by mouth daily.   furosemide 20 MG tablet Commonly known as:  LASIX Take 20 mg tab, repeat in 12 hours if not resolved.   ibuprofen 600 MG tablet Commonly known as:  ADVIL,MOTRIN Take 1 tablet (600 mg total) by mouth every 6 (six) hours as needed (mild pain).   Krill Oil 1000 MG Caps Take 1 capsule by mouth.   levothyroxine 100 MCG tablet Commonly known as:  SYNTHROID Take one tablet by mouth daily before breakfast.   magnesium 30 MG tablet Take 60 mg by mouth at bedtime.   polyethylene glycol packet Commonly known as:  MIRALAX /  GLYCOLAX Take 17 g by mouth daily as needed. Reported on 12/23/2015   selenium 50 MCG Tabs tablet Take 50 mcg by mouth daily.   Zinc 30 MG Caps Take 1 capsule by mouth.       No results found for this or any previous visit (from the past 24 hour(s)). No results found.   ROS: Negative, with the exception of above mentioned in HPI   Objective:  BP 111/75 (BP Location: Left Arm, Patient Position: Sitting, Cuff Size: Large)   Pulse 82   Temp 97.5 F (36.4 C)   Resp 20   Ht 5\' 8"  (1.727 m)   Wt 237 lb 8 oz (107.7 kg)   LMP 02/06/2012   SpO2 97%   BMI 36.11 kg/m  Body mass index is 36.11 kg/m. Gen: Afebrile. No acute distress. Nontoxic in appearance, well developed, well nourished.  HENT: AT. [place].  MMM Eyes:Pupils Equal Round Reactive to light, Extraocular movements intact,  Conjunctiva without redness, discharge or icterus. CV: RRR, no edema Chest: CTAB, no wheeze or crackles. MSK: no erythema, no soft tissue swelling, TTP right anterior hip, +pain and decreased ROM internal rotation and flexion hip. Neg FABRE, Neg SLR. NV intact distally.  Neuro: Normal gait. PERLA. EOMi. Alert. Oriented x3. Muscle strength 5/5 billateral LE, with the exception of mild weakness right hip flexion. DTRs equal bilaterally. Psych: Normal affect, dress and demeanor. Normal speech. Normal thought content and judgment.  Results for orders placed or performed in visit on 04/05/16 (from the past 24 hour(s))  POCT Urinalysis Dipstick (Automated)     Status: None   Collection Time: 04/05/16 10:00 AM  Result Value Ref Range   Color, UA yellow    Clarity, UA clear    Glucose, UA negative    Bilirubin, UA negative    Ketones, UA negative    Spec Grav, UA 1.010    Blood, UA trace-lysed    pH, UA 5.5    Protein, UA negative    Urobilinogen, UA 0.2    Nitrite, UA negative    Leukocytes, UA Negative Negative    Assessment/Plan: LANASHA SYLVE is a 50 y.o. female present for acute OV for multiple  complaints.  Urinary frequency - POCT Urinalysis Dipstick (Automated): basically normal. Discussed bladder irritants as potential  Causes. Will send for culture for reassurance.  - Urine Culture  Hip injury, right, initial encounter - injury sounds like hip flexor strain. She has some pain with internal rotation and flexion,  very mild weakness with hip flexion.  Will obtain Dg hip, advised on schedule NSAIDS for 7 days, rest and then hip flexor rehab to follow (using pain as her guide)  If xray  Normal.  - DG HIP UNILAT WITH PELVIS MIN 4 VIEWS RIGHT; Future - F/U if worsening or not improved. Consider Ortho/sports med referral.   Palpitations - Pt encouraged to continue following with Dr. Letta Median concerning this matter. It sounds as if this occurred during changes of thyroid management and they are working with her on this matter already. She has not had any symptoms in 3 days.  - she already avoids all stimulants/caffeines.  - If she feels extended palpitations as she reports occurred, then she should go to ED to be evaluated at time of occurrence.  - If her endocrine no longer feels it is related to her thyroid changes, then we can certainly follow up with her. Reassured her this does happen thyroid management, but if worsening elongated symptoms she needs seen immediately. Pt agreeable to plan.   Greater than 40 minutes spent with patient, >50% of time spent face to face counseling patient and coordinating care.   electronically signed by:  Howard Pouch, DO  [place]

## 2016-04-05 NOTE — Patient Instructions (Signed)
Please have xray completed, I will call you with results.  Urine culture sent. I will call you with results  Hip Pain Your hip is the joint between your upper legs and your lower pelvis. The bones, cartilage, tendons, and muscles of your hip joint perform a lot of work each day supporting your body weight and allowing you to move around. Hip pain can range from a minor ache to severe pain in one or both of your hips. Pain may be felt on the inside of the hip joint near the groin, or the outside near the buttocks and upper thigh. You may have swelling or stiffness as well.  HOME CARE INSTRUCTIONS   Take medicines only as directed by your health care provider.  Apply ice to the injured area:  Put ice in a plastic bag.  Place a towel between your skin and the bag.  Leave the ice on for 15-20 minutes at a time, 3-4 times a day.  Keep your leg raised (elevated) when possible to lessen swelling.  Avoid activities that cause pain.  Follow specific exercises as directed by your health care provider.  Sleep with a pillow between your legs on your most comfortable side.  Record how often you have hip pain, the location of the pain, and what it feels like. SEEK MEDICAL CARE IF:   You are unable to put weight on your leg.  Your hip is red or swollen or very tender to touch.  Your pain or swelling continues or worsens after 1 week.  You have increasing difficulty walking.  You have a fever. SEEK IMMEDIATE MEDICAL CARE IF:   You have fallen.  You have a sudden increase in pain and swelling in your hip. MAKE SURE YOU:   Understand these instructions.  Will watch your condition.  Will get help right away if you are not doing well or get worse.   This information is not intended to replace advice given to you by your health care provider. Make sure you discuss any questions you have with your health care provider.   Document Released: 01/17/2010 Document Revised: 08/20/2014  Document Reviewed: 03/26/2013 Elsevier Interactive Patient Education [employer].

## 2016-04-05 NOTE — Telephone Encounter (Signed)
Please call pt: - her xray showed early (minimal)  arthritis changes in the joint. Her pain is probably not caused by this and more from the hip flexor we discussed. I would like her to follow the plan we discussed today, if worsening or not improving over the next 3-4 weeks would want to see her back.

## 2016-04-07 LAB — URINE CULTURE

## 2016-04-09 ENCOUNTER — Ambulatory Visit (INDEPENDENT_AMBULATORY_CARE_PROVIDER_SITE_OTHER): Payer: 59 | Admitting: Internal Medicine

## 2016-04-09 ENCOUNTER — Encounter: Payer: Self-pay | Admitting: Internal Medicine

## 2016-04-09 ENCOUNTER — Telehealth: Payer: Self-pay | Admitting: Family Medicine

## 2016-04-09 VITALS — BP 128/78 | HR 71 | Ht 68.5 in | Wt 241.0 lb

## 2016-04-09 DIAGNOSIS — R5383 Other fatigue: Secondary | ICD-10-CM | POA: Insufficient documentation

## 2016-04-09 DIAGNOSIS — E038 Other specified hypothyroidism: Secondary | ICD-10-CM

## 2016-04-09 DIAGNOSIS — R7303 Prediabetes: Secondary | ICD-10-CM | POA: Diagnosis not present

## 2016-04-09 NOTE — Telephone Encounter (Signed)
Please call pt; - her urine studies did show moderate bacteria load. Do nto always treat if lower bacteria load, but if she is still symptomatic would treat her.  - please ask, and advise if I need to call in abx .

## 2016-04-09 NOTE — Progress Notes (Signed)
Patient ID: Allison Compton, female   DOB: [birth year], 50 y.o.   MRN: [id number]   HPI  Allison Compton is a 50 y.o.-year-old female, returning for f/u for Hashimoto's hypothyroidism and prediabetes. Last visit 4 mo ago.   Since last visit, she eliminated gluten, dairy, alcohol, caffeine. She initially felt great, then had palpitations >> now better. She has been feeling tired in last week.  Pt. has been dx with hypothyroidism in 04/2015; now on Levothyroxine 100 mcg daily , taken: - fasting, at 4 am - with water - separated by >30 min from b'fast - at 7 am - no iron, PPIs - + B complex  - takes selenium and zinc, 2 Mg at night  I reviewed pt's thyroid tests: Lab Results  Component Value Date   TSH 4.93 (H) 03/13/2016   TSH 7.92 (H) 12/19/2015   TSH 16.71 (H) 09/30/2015   TSH 7.90 (H) 05/30/2015   TSH 48.31 (H) 04/19/2015   TSH 3.60 02/26/2014   TSH 1.31 12/07/2011   TSH 2.48 09/21/2010   TSH 2.23 08/31/2009   TSH 2.10 04/29/2009   FREET4 0.92 03/13/2016   FREET4 0.90 12/19/2015   FREET4 0.88 09/30/2015   FREET4 0.75 05/30/2015   FREET4 0.55 (L) 04/19/2015   FREET4 0.7 11/29/2008   FREET4 0.7 10/16/2006    We dx'ed Hashimoto's thyroiditis: Component     Latest Ref Rng 05/30/2015  Thyroperoxidase Ab SerPl-aCnc     <9 IU/mL 782 (H)  Thyroglobulin Ab     <2 IU/mL 2 (H)   Pt describes: - + weight gain - + fatigue - no heat or cold intolerance, but has hot flushes after her hysterectomy + BSO - no depression - no constipation - no dry skin - no hair loss  Pt denies feeling nodules in neck, hoarseness, dysphagia/odynophagia, SOB with lying down. Sometimes feels something stuck in her throat.  She has + FH of thyroid disorders in: several cousins. No FH of thyroid cancer.  No h/o radiation tx to head or neck. No recent use of iodine supplements.  Prediabetes:  Last HbA1c: Lab Results  Component Value Date   HGBA1C 6.3 12/19/2015   HGBA1C 6.0 09/30/2015   HGBA1C  5.4 04/19/2015   No CKD: Lab Results  Component Value Date   BUN 14 12/19/2015   Lab Results  Component Value Date   CREATININE 0.73 12/19/2015   Lab Results  Component Value Date   MICRALBCREAT 21.5 09/29/2008   MICRALBCREAT 2.0 02/17/2008   MICRALBCREAT 3.9 05/29/2007   She is not checking sugars at home.  She also has a history of hysterectomy in 2015. She has HL.  ROS: Constitutional: see HPI Eyes: no blurry vision, no xerophthalmia ENT: no sore throat, no nodules palpated in throat, no dysphagia/odynophagia, no hoarseness Cardiovascular: no CP/SOB/palpitations/+ leg swelling Respiratory: no cough/SOB Gastrointestinal: no N/V/D/C Musculoskeletal: no muscle/joint aches Skin: no rashes Neurological: no tremors/numbness/tingling/dizziness  I reviewed pt's medications, allergies, PMH, social hx, family hx, and changes were documented in the history of present illness. Otherwise, unchanged from my initial visit note.  Past Medical History:  Diagnosis Date  . Anal tear   . 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 Eczema   . Edema of both legs   . Fibroids   . Hashimoto's disease   . Hyperlipidemia   . Pre-diabetes    Pt states she is "borderline" for yrs.  . Seasonal affective disorder ([place])  PMH of   Past Surgical History:  Procedure Laterality Date  . ABDOMINAL HYSTERECTOMY  02/10/2014   Dr Gaetano Net  . CERVICAL BIOPSY  W/ LOOP ELECTRODE EXCISION  2002  . cervical dysplasia  2000   LEEP  . LAPAROSCOPIC ASSISTED VAGINAL HYSTERECTOMY N/A 02/10/2014   Procedure: LAPAROSCOPIC ASSISTED VAGINAL HYSTERECTOMY;  Surgeon: Allena Katz, MD;  Location: [facility];  Service: Gynecology;  Laterality: N/A;  . SALPINGOOPHORECTOMY Bilateral 02/10/2014   Procedure: SALPINGO OOPHORECTOMY;  Surgeon: Allena Katz, MD;  Location: [facility];  Service: Gynecology;  Laterality: Bilateral;   Social History   Social History  . Marital Status: Married    Spouse  Name: N/A  . Number of Children: 1   Occupational History  . n/a   Social History Main Topics  . Smoking status: Former Smoker    Quit date: 08/13/1985  . Smokeless tobacco: Not on file     Comment: age [age], up to 1 ppd  . Alcohol Use: Yes     Comment: rarely once a month or less  . Drug Use: No   Current Outpatient Prescriptions on File Prior to Visit  Medication Sig Dispense Refill  . Fish Oil-Cholecalciferol (FISH OIL + D3 PO) Take 1 capsule by mouth daily.    . furosemide (LASIX) 20 MG tablet Take 20 mg tab, repeat in 12 hours if not resolved. (Patient not taking: Reported on 04/05/2016) 30 tablet 0  . ibuprofen (ADVIL,MOTRIN) 600 MG tablet Take 1 tablet (600 mg total) by mouth every 6 (six) hours as needed (mild pain). 30 tablet 0  . Krill Oil 1000 MG CAPS Take 1 capsule by mouth.    . levothyroxine (SYNTHROID) 100 MCG tablet Take one tablet by mouth daily before breakfast. 90 tablet 1  . magnesium 30 MG tablet Take 60 mg by mouth at bedtime.    . polyethylene glycol (MIRALAX / GLYCOLAX) packet Take 17 g by mouth daily as needed. Reported on 12/23/2015    . selenium 50 MCG TABS tablet Take 50 mcg by mouth daily.    . Zinc 30 MG CAPS Take 1 capsule by mouth.     No current facility-administered medications on file prior to visit.    No Known Allergies Family History  Problem Relation Age of Onset  . Diabetes Mother   . Asthma Mother   . Seasonal affective disorder Mother   . Clotting disorder Mother     excess platelets  . Heart disease Mother   . COPD Mother   . Kidney cancer Father   . Hypertension Father   . Hyperlipidemia Maternal Grandmother   . Stroke Maternal Grandmother 75    mini strokes  . Pancreatic cancer Paternal Grandmother   . Heart failure Paternal Grandmother   . Heart failure Maternal Grandfather     MI @ 43  . Alzheimer's disease Paternal Grandfather   . Seasonal affective disorder Sister   . GI problems Brother     GI bleed  .     Marland Kitchen Heart  disease Paternal Uncle    PE: BP 128/78 (BP Location: Left Arm, Patient Position: Sitting)   Pulse 71   Ht 5' 8.5" (1.74 m)   Wt 241 lb (109.3 kg)   LMP 02/06/2012   SpO2 96%   BMI 36.11 kg/m  Wt Readings from Last 3 Encounters:  04/09/16 241 lb (109.3 kg)  04/05/16 237 lb 8 oz (107.7 kg)  12/26/15 235 lb 8 oz (106.8 kg)  Constitutional: overweight, in NAD Eyes: PERRLA, EOMI, no exophthalmos ENT: moist mucous membranes, no thyromegaly, but R thyroid fullness, no cervical lymphadenopathy Cardiovascular: RRR, No MRG, ++ pitting LE swelling Respiratory: CTA B Gastrointestinal: abdomen soft, NT, ND, BS+ Musculoskeletal: no deformities, strength intact in all 4 Skin: moist, warm, no rashes Neurological: no tremor with outstretched hands, DTR normal in all 4  ASSESSMENT: 1. Hypothyroidism - Thyroid U/S (06/13/2015): Thyroid tissue is mildly heterogeneous but no focal nodules.  2. Prediabetes Lab Results  Component Value Date   HGBA1C 6.3 12/19/2015   3. Fatigue  PLAN:  1. Patient with Hashimoto's hypothyroidism, on levothyroxine therapy. She c/o fatigue and weight gain and still has a lot of stress at home (son going to college, husband with prostate cancer) and at work (very busy). However, she went to [place] for 1 month this Spring! She loved it! - We increased her LT4 to 100 mcg daily on this dose, 4 weeks ago, TSH improved, but it was still slightly above the upper limit of normal  - We discussed about correct intake of levothyroxine, fasting, with water, separated by at least 30 minutes from breakfast, and separated by more than 4 hours from calcium, iron, multivitamins, acid reflux medications (PPIs). She is taking it correctly. - will check TFTs in 2 weeks - I will see her back in 6 months  2. Prediabetes - last HbA1c in the prediabetic range - will need a recheck in 2 weeks per her preference - she will need a glucometer if HbA1c increasing  3. Fatigue - will  check a B12 and D vitamin level at next blood draw  Orders Placed This Encounter  Procedures  . T4, free  . TSH  . Hemoglobin A1c  . Vitamin D, 25-hydroxy  . Vitamin B12   Philemon Kingdom, MD PhD [facility] Endocrinology

## 2016-04-09 NOTE — Patient Instructions (Signed)
Please continue Levothyroxine 100 mcg dairy.  Take the thyroid hormone every day, with water, at least 30 minutes before breakfast, separated by at least 4 hours from: - acid reflux medications - calcium - iron - multivitamins  Please come back for labs in 2 weeks.  Please come back for a follow-up appointment in 6 months.

## 2016-04-09 NOTE — Telephone Encounter (Signed)
Left message for patient to return call.

## 2016-04-10 NOTE — Telephone Encounter (Signed)
Left message with information on patient voice mail. Requested patient call back to let us know if she is still having Sx.

## 2016-04-12 MED ORDER — CEPHALEXIN 500 MG PO CAPS: 500.0000 mg | ORAL_CAPSULE | Freq: Four times a day (QID) | ORAL | 0 refills | Status: DC

## 2016-04-12 NOTE — Telephone Encounter (Signed)
Patient called left message stating she is still having urgency and frequency and wants an antibiotic called in . Please advise.

## 2016-04-12 NOTE — Addendum Note (Signed)
Addended by: Howard Pouch A on: 04/12/2016 01:16 PM   Modules accepted: Orders

## 2016-04-12 NOTE — Telephone Encounter (Signed)
Keflex prescribed.

## 2016-05-09 ENCOUNTER — Encounter: Payer: Self-pay | Admitting: Family Medicine

## 2016-05-09 ENCOUNTER — Encounter: Payer: Self-pay | Admitting: Cardiovascular Disease

## 2016-05-09 ENCOUNTER — Ambulatory Visit (INDEPENDENT_AMBULATORY_CARE_PROVIDER_SITE_OTHER): Payer: 59 | Admitting: Cardiovascular Disease

## 2016-05-09 VITALS — BP 131/77 | HR 65 | Ht 69.0 in | Wt 237.6 lb

## 2016-05-09 DIAGNOSIS — R9431 Abnormal electrocardiogram [ECG] [EKG]: Secondary | ICD-10-CM

## 2016-05-09 DIAGNOSIS — E038 Other specified hypothyroidism: Secondary | ICD-10-CM | POA: Diagnosis not present

## 2016-05-09 DIAGNOSIS — R7303 Prediabetes: Secondary | ICD-10-CM

## 2016-05-09 DIAGNOSIS — E782 Mixed hyperlipidemia: Secondary | ICD-10-CM

## 2016-05-09 DIAGNOSIS — R002 Palpitations: Secondary | ICD-10-CM | POA: Diagnosis not present

## 2016-05-09 NOTE — Patient Instructions (Signed)
Medication Instructions: Dr Sallyanne Kuster recommends that you continue on your current medications as directed. Please refer to the Current Medication list given to you today.  Labwork: NONE ORDERED  Testing/Procedures: 1. Echocardiogram - Your physician has requested that you have an echocardiogram. Echocardiography is a painless test that uses sound waves to create images of your heart. It provides your doctor with information about the size and shape of your heart and how well your heart's chambers and valves are working. This procedure takes approximately one hour. There are no restrictions for this procedure. This will be performed at our [facility] location - [address], [address].  2. Exercise Tolerance Test (GXT) - Your physician has requested that you have an exercise tolerance test. For further information please visit [email or web address]. Please also follow instruction sheet, as given.  Follow-up: Dr Sallyanne Kuster recommends that you schedule a follow-up appointment in 1 year. You will receive a reminder letter in the mail two months in advance. If you don't receive a letter, please call our office to schedule the follow-up appointment.  If you need a refill on your cardiac medications before your next appointment, please call your pharmacy.

## 2016-05-09 NOTE — Progress Notes (Signed)
Cardiology Office Note    Date:  05/09/2016   ID:  Allison Compton, DOB 11/15/1965, MRN [id number]  PCP:  Howard Pouch, DO  Cardiologist:   Sanda Klein, MD   Chief Complaint  Patient presents with  . New Patient (Initial Visit)    Severe Palpitations    History of Present Illness:  Allison Compton is a 49 y.o. female with recently diagnosed Hashimoto's thyroiditis and borderline diabetes mellitus who has experienced frequent palpitations. The palpitations were described as sporadic fluttering in her chest, not associated with significant dyspnea, near-syncope/syncope, chest pain or other troublesome complaints. They occurred in the context of increased stress at work, adjustments in her hormone replacement therapy (switching from generic levothyroxine to Synthroid) and important changes in her diet, in her efforts to lose weight and stay off diabetes mellitus. She is carefully avoiding coffee or any other caffeinated beverages. She is not taking any over-the-counter or herbal supplements or weight loss remedies.  Since making this appointment her symptoms have actually improved and they have gone from occurring several times daily to occurring fairly rarely. She is starting a program of exercise and has not noticed any limitations from exertion related cardiac symptoms. She does have a family history of coronary artery disease (grandfather died of a heart attack at age 58). She has mild ankle swelling that occurs intermittently. Underwent hysterectomy and surgical menopause roughly 1-1/2 years ago.  She remembers having a treadmill stress test about 15 years ago, ordered by Dr. Linna Darner. Unfortunately can't find a record of it.  Past Medical History:  Diagnosis Date  . Anal tear   . 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 Eczema   . Edema of both legs   . Fibroids   . Hashimoto's disease   . Hyperlipidemia   . Pre-diabetes    Pt states she is "borderline" for yrs.    . Seasonal affective disorder ([place])    PMH of    Past Surgical History:  Procedure Laterality Date  . ABDOMINAL HYSTERECTOMY  02/10/2014   Dr Gaetano Net  . CERVICAL BIOPSY  W/ LOOP ELECTRODE EXCISION  2002  . cervical dysplasia  2000   LEEP  . LAPAROSCOPIC ASSISTED VAGINAL HYSTERECTOMY N/A 02/10/2014   Procedure: LAPAROSCOPIC ASSISTED VAGINAL HYSTERECTOMY;  Surgeon: Allena Katz, MD;  Location: [facility];  Service: Gynecology;  Laterality: N/A;  . SALPINGOOPHORECTOMY Bilateral 02/10/2014   Procedure: SALPINGO OOPHORECTOMY;  Surgeon: Allena Katz, MD;  Location: [facility];  Service: Gynecology;  Laterality: Bilateral;    Current Medications: Outpatient Medications Prior to Visit  Medication Sig Dispense Refill  . Fish Oil-Cholecalciferol (FISH OIL + D3 PO) Take 1 capsule by mouth daily.    Marland Kitchen ibuprofen (ADVIL,MOTRIN) 600 MG tablet Take 1 tablet (600 mg total) by mouth every 6 (six) hours as needed (mild pain). 30 tablet 0  . Krill Oil 1000 MG CAPS Take 1 capsule by mouth.    . levothyroxine (SYNTHROID) 100 MCG tablet Take one tablet by mouth daily before breakfast. 90 tablet 1  . magnesium 30 MG tablet Take 60 mg by mouth 2 (two) times daily.     . polyethylene glycol (MIRALAX / GLYCOLAX) packet Take 17 g by mouth daily as needed. Reported on 12/23/2015    . selenium 50 MCG TABS tablet Take 50 mcg by mouth daily.    . Zinc 30 MG CAPS Take 1 capsule by mouth.    Marland Kitchen  cephALEXin (KEFLEX) 500 MG capsule Take 1 capsule (500 mg total) by mouth 4 (four) times daily. 28 capsule 0  . furosemide (LASIX) 20 MG tablet Take 20 mg tab, repeat in 12 hours if not resolved. (Patient not taking: Reported on 04/05/2016) 30 tablet 0   No facility-administered medications prior to visit.      Allergies:   Review of patient's allergies indicates no known allergies.   Social History   Social History  . Marital status: Married    Spouse name: N/A  . Number of children: N/A  . Years of education: N/A    Social History Main Topics  . Smoking status: Former Smoker    Quit date: 08/13/1985  . Smokeless tobacco: Never Used     Comment: age [age], up to 1 ppd  . Alcohol use Yes     Comment: rarely once a month or less  . Drug use: No  . Sexual activity: No   Other Topics Concern  . None   Social History Narrative   Married, "[place]" [place]). 1 adult son [name]).    BA degree, Tree surgeon.    Drinks caffeine occasionally, uses herbal remedies.   Wears her seatbelt, smoke detector in the home.   Firearms in the home, locked cabinet.   Feels safe in her relationships.        Family History:  The patient's family history includes Alzheimer's disease in her paternal grandfather; Asthma in her mother; COPD in her mother; Clotting disorder in her mother; Diabetes in her mother; GI problems in her brother; Heart disease in her mother and paternal uncle; Heart failure in her maternal grandfather and paternal grandmother; Hyperlipidemia in her maternal grandmother; Hypertension in her father; Kidney cancer in her father; Pancreatic cancer in her paternal grandmother; Seasonal affective disorder in her mother and sister; Stroke (age of onset: 72) in her maternal grandmother.   ROS:   Please see the history of present illness.    ROS All other systems reviewed and are negative.   PHYSICAL EXAM:   VS:  BP 131/77   Pulse 65   Ht 5\' 9"  (1.753 m)   Wt 107.8 kg (237 lb 9.6 oz)   LMP 02/06/2012   BMI 35.09 kg/m    GEN: Well nourished, well developed, in no acute distress  HEENT: normal  Neck: no JVD, carotid bruits, or masses Cardiac: RRR; no murmurs, rubs, or gallops,no edema  Respiratory:  clear to auscultation bilaterally, normal work of breathing GI: soft, nontender, nondistended, + BS MS: no deformity or atrophy  Skin: warm and dry, no rash Neuro:  Alert and Oriented x 3, Strength and sensation are intact Psych: euthymic mood, full affect  Wt Readings from Last 3 Encounters:   05/09/16 107.8 kg (237 lb 9.6 oz)  04/09/16 109.3 kg (241 lb)  04/05/16 107.7 kg (237 lb 8 oz)      Studies/Labs Reviewed:   EKG:  EKG is ordered today.  The ekg ordered today demonstrates Normal sinus rhythm, T-wave flattening in leads V3-V6 and T-wave inversion in leads 3 and aVF, QTC 418 ms  Recent Labs: 12/19/2015: ALT 22; BUN 14; Creatinine, Ser 0.73; Potassium 4.1; Sodium 138 03/13/2016: TSH 4.93   Lipid Panel    Component Value Date/Time   CHOL 243 (H) 12/19/2015 1140   TRIG 118.0 12/19/2015 1140   HDL 65.60 12/19/2015 1140   CHOLHDL 4 12/19/2015 1140   VLDL 23.6 12/19/2015 1140   LDLCALC 153 (H) 12/19/2015 1140  LDLDIRECT 139.9 02/17/2008 0852    Additional studies/ records that were reviewed today include:  Notes from primary care provider and her endocrinologist    ASSESSMENT:    1. Heart palpitations   2. Abnormal EKG   3. HYPERLIPIDEMIA   4. Other specified hypothyroidism   5. Prediabetes      PLAN:  In order of problems listed above:  1. Palpitations: She seems to be describing isolated PACs and PVCs, probably related to adjustment in her thyroid hormone dose and emotional stress. The symptoms have improved and specific therapy does not appear to be necessary at this time. She does have risk factors for cardiac illness and I have recommended that she have an echocardiogram to exclude significant structural heart disease. 2. Abnormal ECG: The changes are nonspecific, but do raise the concern for possible coronary disease in this postmenopausal woman with hyperlipidemia, borderline diabetes mellitus and obesity. Recommend a treadmill stress test 3. HLP: Recommend target LDL less than 100 in view of her borderline diabetes mellitus. She wants to see the effects of diet and lifestyle changes before taking medications. She has eliminated sugars and most starches from her diet. Recommended daily exercise. 4. Treated hypothyroidism: Recent thyroid profile shows  parameters close to the desirable range 5. Borderline DM: last hemoglobin A1c 6.3%. Diet and exercise as discussed above.  We'll call her with the results of the echo and treadmill stress test if abnormal. Otherwise will make an appointment to discuss any abnormal findings.  Medication Adjustments/Labs and Tests Ordered: Current medicines are reviewed at length with the patient today.  Concerns regarding medicines are outlined above.  Medication changes, Labs and Tests ordered today are listed in the Patient Instructions below. Patient Instructions  Medication Instructions: Dr Sallyanne Kuster recommends that you continue on your current medications as directed. Please refer to the Current Medication list given to you today.  Labwork: NONE ORDERED  Testing/Procedures: 1. Echocardiogram - Your physician has requested that you have an echocardiogram. Echocardiography is a painless test that uses sound waves to create images of your heart. It provides your doctor with information about the size and shape of your heart and how well your heart's chambers and valves are working. This procedure takes approximately one hour. There are no restrictions for this procedure. This will be performed at our [facility] location - [address].  2. Exercise Tolerance Test (GXT) - Your physician has requested that you have an exercise tolerance test. For further information please visit [email or web address]. Please also follow instruction sheet, as given.  Follow-up: Dr Sallyanne Kuster recommends that you schedule a follow-up appointment in 1 year. You will receive a reminder letter in the mail two months in advance. If you don't receive a letter, please call our office to schedule the follow-up appointment.  If you need a refill on your cardiac medications before your next appointment, please call your pharmacy.    Signed, Sanda Klein, MD  05/09/2016 1:34 PM    [facility] [address], [place], [place]  [address] Phone: [phone]; Fax: [phone]

## 2016-05-17 ENCOUNTER — Other Ambulatory Visit: Payer: Self-pay

## 2016-05-17 MED ORDER — LEVOTHYROXINE SODIUM 100 MCG PO TABS: ORAL_TABLET | ORAL | 1 refills | Status: DC

## 2016-05-23 ENCOUNTER — Other Ambulatory Visit: Payer: Self-pay

## 2016-05-23 MED ORDER — LEVOTHYROXINE SODIUM 100 MCG PO TABS: ORAL_TABLET | ORAL | 1 refills | Status: DC

## 2016-05-24 ENCOUNTER — Other Ambulatory Visit: Payer: Self-pay | Admitting: Internal Medicine

## 2016-05-24 ENCOUNTER — Telehealth: Payer: Self-pay

## 2016-05-24 ENCOUNTER — Telehealth: Payer: Self-pay | Admitting: Internal Medicine

## 2016-05-24 DIAGNOSIS — E038 Other specified hypothyroidism: Secondary | ICD-10-CM

## 2016-05-24 DIAGNOSIS — E063 Autoimmune thyroiditis: Principal | ICD-10-CM

## 2016-05-24 NOTE — Telephone Encounter (Signed)
Called patient and left message advising of add on test for lab work tomorrow. Gave call back number if any questions.

## 2016-05-24 NOTE — Telephone Encounter (Signed)
Yes, no pb! I did.

## 2016-05-24 NOTE — Telephone Encounter (Signed)
Patient stated she has abs tomorrow and ask if Dr Cruzita Lederer would add on an anitbody test to see if the her levels are coming down. (She has hashimoto's disease)  Please advise before he appt tomorrow

## 2016-05-25 ENCOUNTER — Other Ambulatory Visit: Payer: 59

## 2016-05-28 ENCOUNTER — Other Ambulatory Visit (INDEPENDENT_AMBULATORY_CARE_PROVIDER_SITE_OTHER): Payer: 59

## 2016-05-28 ENCOUNTER — Other Ambulatory Visit: Payer: Self-pay

## 2016-05-28 ENCOUNTER — Telehealth: Payer: Self-pay | Admitting: Internal Medicine

## 2016-05-28 DIAGNOSIS — R7303 Prediabetes: Secondary | ICD-10-CM | POA: Diagnosis not present

## 2016-05-28 DIAGNOSIS — E038 Other specified hypothyroidism: Secondary | ICD-10-CM

## 2016-05-28 DIAGNOSIS — R5383 Other fatigue: Secondary | ICD-10-CM | POA: Diagnosis not present

## 2016-05-28 DIAGNOSIS — E063 Autoimmune thyroiditis: Secondary | ICD-10-CM

## 2016-05-28 LAB — VITAMIN B12: VITAMIN B 12: 630 pg/mL (ref 211–911)

## 2016-05-28 LAB — TSH: TSH: 6.17 u[IU]/mL — ABNORMAL HIGH (ref 0.35–4.50)

## 2016-05-28 LAB — VITAMIN D 25 HYDROXY (VIT D DEFICIENCY, FRACTURES): VITD: 25.96 ng/mL — AB (ref 30.00–100.00)

## 2016-05-28 LAB — T4, FREE: FREE T4: 1.02 ng/dL (ref 0.60–1.60)

## 2016-05-28 LAB — HEMOGLOBIN A1C: HEMOGLOBIN A1C: 6 % (ref 4.6–6.5)

## 2016-05-28 MED ORDER — LEVOTHYROXINE SODIUM 100 MCG PO TABS: ORAL_TABLET | ORAL | 0 refills | Status: DC

## 2016-05-28 MED ORDER — LEVOTHYROXINE SODIUM 100 MCG PO TABS: ORAL_TABLET | ORAL | 1 refills | Status: DC

## 2016-05-28 NOTE — Telephone Encounter (Signed)
Resubmitted the information to patient pharmacy, sent into [facility] and into patient local pharmacy for 62month supply.

## 2016-05-28 NOTE — Telephone Encounter (Signed)
Pt called and said that [facility] has not received her prescription for the Levoythroxine.  She asked that we resubmit it to [facility] and also send in a couple of weeks supply to the local pharmacy, [facility] on [address].

## 2016-05-29 LAB — THYROGLOBULIN ANTIBODY: Thyroglobulin Ab: 1 IU/mL (ref <2)

## 2016-05-29 LAB — THYROID PEROXIDASE ANTIBODY: Thyroperoxidase Ab SerPl-aCnc: 652 IU/mL — ABNORMAL HIGH (ref <9)

## 2016-05-31 ENCOUNTER — Other Ambulatory Visit: Payer: Self-pay

## 2016-05-31 ENCOUNTER — Telehealth: Payer: Self-pay | Admitting: Family Medicine

## 2016-05-31 MED ORDER — SYNTHROID 125 MCG PO TABS: 125.0000 ug | ORAL_TABLET | Freq: Every day | ORAL | 2 refills | Status: DC

## 2016-05-31 NOTE — Telephone Encounter (Signed)
Patient changed her brand for her generic levothyroxine to Synthroid and they need the doctor's okay to do that. Please advise.

## 2016-05-31 NOTE — Telephone Encounter (Signed)
Yes, no problem!

## 2016-06-01 ENCOUNTER — Telehealth: Payer: Self-pay | Admitting: Family Medicine

## 2016-06-01 NOTE — Telephone Encounter (Signed)
E R R O R - There was already a note on this problem

## 2016-06-02 ENCOUNTER — Encounter: Payer: Self-pay | Admitting: Internal Medicine

## 2016-06-04 ENCOUNTER — Other Ambulatory Visit: Payer: Self-pay

## 2016-06-06 ENCOUNTER — Ambulatory Visit (HOSPITAL_COMMUNITY): Payer: 59 | Attending: Cardiovascular Disease

## 2016-06-06 ENCOUNTER — Ambulatory Visit (INDEPENDENT_AMBULATORY_CARE_PROVIDER_SITE_OTHER): Payer: 59

## 2016-06-06 ENCOUNTER — Other Ambulatory Visit: Payer: Self-pay

## 2016-06-06 DIAGNOSIS — E785 Hyperlipidemia, unspecified: Secondary | ICD-10-CM | POA: Diagnosis not present

## 2016-06-06 DIAGNOSIS — R9431 Abnormal electrocardiogram [ECG] [EKG]: Secondary | ICD-10-CM

## 2016-06-06 DIAGNOSIS — E669 Obesity, unspecified: Secondary | ICD-10-CM | POA: Diagnosis not present

## 2016-06-06 DIAGNOSIS — Z6835 Body mass index (BMI) 35.0-35.9, adult: Secondary | ICD-10-CM | POA: Diagnosis not present

## 2016-06-06 DIAGNOSIS — R002 Palpitations: Secondary | ICD-10-CM | POA: Insufficient documentation

## 2016-06-06 DIAGNOSIS — I517 Cardiomegaly: Secondary | ICD-10-CM | POA: Diagnosis not present

## 2016-06-06 DIAGNOSIS — Z87891 Personal history of nicotine dependence: Secondary | ICD-10-CM | POA: Diagnosis not present

## 2016-06-06 LAB — ECHOCARDIOGRAM COMPLETE
AOASC: 29 cm
CHL CUP RV SYS PRESS: 22 mmHg
CHL CUP STROKE VOLUME: 44 mL
E/e' ratio: 9.36
EWDT: 278 ms
FS: 37 % (ref 28–44)
IV/PV OW: 1.15
LA diam end sys: 38.66 mm
LA diam index: 1.74 cm/m2
LA vol A4C: 48 ml
LA vol index: 23 mL/m2
LASIZE: 38.66 mm
LAVOL: 51 mL
LV E/e' medial: 9.36
LV dias vol: 73 mL (ref 46–106)
LV e' LATERAL: 12.5 cm/s
LV sys vol index: 13 mL/m2
LVDIAVOLIN: 33 mL/m2
LVEEAVG: 9.36
LVOT VTI: 20.9 cm
LVOT area: 3.14 cm2
LVOT peak grad rest: 4 mmHg
LVOTD: 20 mm
LVOTPV: 97.8 cm/s
LVOTSV: 66 mL
LVSYSVOL: 29 mL (ref 14–42)
MV Dec: 278
MV Peak grad: 5 mmHg
MV pk A vel: 93.3 m/s
MV pk E vel: 117 m/s
PW: 9.64 mm — AB (ref 0.6–1.1)
RV LATERAL S' VELOCITY: 13.3 cm/s
Reg peak vel: 218 cm/s
Simpson's disk: 60
TDI e' lateral: 12.5
TDI e' medial: 8.97
TR max vel: 218 cm/s

## 2016-06-07 LAB — EXERCISE TOLERANCE TEST
CHL CUP STRESS STAGE 1 GRADE: 0 %
CHL CUP STRESS STAGE 1 HR: 93 {beats}/min
CHL CUP STRESS STAGE 1 SBP: 139 mmHg
CHL CUP STRESS STAGE 2 HR: 82 {beats}/min
CHL CUP STRESS STAGE 3 GRADE: 0 %
CHL CUP STRESS STAGE 4 HR: 120 {beats}/min
CHL CUP STRESS STAGE 5 DBP: 71 mmHg
CHL CUP STRESS STAGE 5 SPEED: 2.5 mph
CHL CUP STRESS STAGE 6 GRADE: 12 %
CHL CUP STRESS STAGE 6 HR: 153 {beats}/min
CHL CUP STRESS STAGE 7 DBP: 62 mmHg
CHL CUP STRESS STAGE 7 HR: 129 {beats}/min
CHL CUP STRESS STAGE 7 SBP: 203 mmHg
CSEPPHR: 153 {beats}/min
CSEPPMHR: 91 %
Estimated workload: 7 METS
Exercise duration (min): 6 min
Exercise duration (sec): 0 s
MPHR: 168 {beats}/min
Percent HR: 92 %
RPE: 16
Rest HR: 77 {beats}/min
Stage 1 DBP: 76 mmHg
Stage 1 Speed: 0 mph
Stage 2 Grade: 0 %
Stage 2 Speed: 1 mph
Stage 3 HR: 81 {beats}/min
Stage 3 Speed: 1 mph
Stage 4 DBP: 63 mmHg
Stage 4 Grade: 10 %
Stage 4 SBP: 177 mmHg
Stage 4 Speed: 1.7 mph
Stage 5 Grade: 12 %
Stage 5 HR: 153 {beats}/min
Stage 5 SBP: 211 mmHg
Stage 6 Speed: 2.5 mph
Stage 7 Grade: 0 %
Stage 7 Speed: 0 mph
Stage 8 DBP: 63 mmHg
Stage 8 Grade: 0 %
Stage 8 HR: 89 {beats}/min
Stage 8 SBP: 139 mmHg
Stage 8 Speed: 0 mph

## 2016-06-14 ENCOUNTER — Encounter: Payer: Self-pay | Admitting: Cardiovascular Disease

## 2016-07-20 ENCOUNTER — Telehealth: Payer: Self-pay | Admitting: Internal Medicine

## 2016-07-20 DIAGNOSIS — E038 Other specified hypothyroidism: Secondary | ICD-10-CM

## 2016-07-20 NOTE — Telephone Encounter (Signed)
It is time for the pt to have her labs done she has them done at [facility] please input the labs and call the pt when they are in so she can make an appt with [facility]

## 2016-07-23 DIAGNOSIS — E063 Autoimmune thyroiditis: Secondary | ICD-10-CM | POA: Insufficient documentation

## 2016-07-23 NOTE — Telephone Encounter (Signed)
TSH and free T4 - can you please order as future orders? Thank you, C

## 2016-07-23 NOTE — Telephone Encounter (Signed)
Done

## 2016-07-23 NOTE — Telephone Encounter (Signed)
Dr. Cruzita Lederer will you please confirm which labs should be ordered?  Thanks

## 2016-07-27 ENCOUNTER — Ambulatory Visit (INDEPENDENT_AMBULATORY_CARE_PROVIDER_SITE_OTHER): Payer: 59 | Admitting: Family Medicine

## 2016-07-27 ENCOUNTER — Encounter: Payer: Self-pay | Admitting: Family Medicine

## 2016-07-27 VITALS — BP 117/75 | HR 67 | Temp 97.4°F | Resp 20 | Ht 69.0 in | Wt 239.2 lb

## 2016-07-27 DIAGNOSIS — S79911D Unspecified injury of right hip, subsequent encounter: Secondary | ICD-10-CM | POA: Diagnosis not present

## 2016-07-27 DIAGNOSIS — E038 Other specified hypothyroidism: Secondary | ICD-10-CM

## 2016-07-27 DIAGNOSIS — H04123 Dry eye syndrome of bilateral lacrimal glands: Secondary | ICD-10-CM

## 2016-07-27 DIAGNOSIS — R682 Dry mouth, unspecified: Secondary | ICD-10-CM | POA: Diagnosis not present

## 2016-07-27 LAB — T4, FREE: FREE T4: 1.29 ng/dL (ref 0.60–1.60)

## 2016-07-27 LAB — TSH: TSH: 1.74 u[IU]/mL (ref 0.35–4.50)

## 2016-07-27 NOTE — Patient Instructions (Addendum)
It was a pleasure seeing you today. I hope you have a great holiday.  Make sure to reach out to your eye doctor and let her know your labs are collected here.

## 2016-07-27 NOTE — Progress Notes (Signed)
Allison Compton , [birth year], 50 y.o., female MRN: [id number] Patient Care Team    Relationship Specialty Notifications Start End  Ma Hillock, DO PCP - General Family Medicine  12/22/15   Philemon Kingdom, MD Consulting Physician Internal Medicine  12/23/15    Comment: prediabetes/thyroid    CC: discuss potential autoimmune disorder Subjective: Pt presents for an  OV to discuss her potential autoimmune disorder. She was seen by her ophthalmologist which felt she had dry eyes and degradation of her tear ducts. They gave the patient orders to have Sjogren labs collected. Patient presents today to discuss potential diagnosis and work up completed here, so it can be apart of her record. She states 2 months ago she noticed her mouth was more dry. It had started to worsen over the course of a month and she went to her dentist. Her dentist has her using biotin for her dry mouth. She then had increasing dry eyes and proceeded to have an eye exam which lead to her presenting here today. She is using restasis for her dry eyes. She had hashimoto thyroiditis and eczema. She states she feels like her body is revolting against her and she does not understand why, since she is eating gluten free, very low sugar and exercising routinely. She does endorse increased stress. She also has had a bad sinus infection, which she was treated and improving.   Right hip injury: Patient also states that her right hip is started bothering her again. Initially her right hip became painful mid August after traveling and walking. He was treated as a hip flexor injury. Her x-rays were normal. She initially saw improvement. However the pain is intermittent and returning again. She would like an orthopedic referral today.  No Known Allergies Social History  Substance Use Topics  . Smoking status: Former Smoker    Quit date: 08/13/1985  . Smokeless tobacco: Never Used     Comment: age [age], up to 1 ppd  . Alcohol use Yes   Comment: rarely once a month or less   Past Medical History:  Diagnosis Date  . Anal tear   . 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 Eczema   . Edema of both legs   . Fibroids   . Hashimoto's disease   . Hyperlipidemia   . Pre-diabetes    Pt states she is "borderline" for yrs.  . Seasonal affective disorder ([place])    PMH of   Past Surgical History:  Procedure Laterality Date  . ABDOMINAL HYSTERECTOMY  02/10/2014   Dr Gaetano Net  . CERVICAL BIOPSY  W/ LOOP ELECTRODE EXCISION  2002  . cervical dysplasia  2000   LEEP  . LAPAROSCOPIC ASSISTED VAGINAL HYSTERECTOMY N/A 02/10/2014   Procedure: LAPAROSCOPIC ASSISTED VAGINAL HYSTERECTOMY;  Surgeon: Allena Katz, MD;  Location: [facility];  Service: Gynecology;  Laterality: N/A;  . SALPINGOOPHORECTOMY Bilateral 02/10/2014   Procedure: SALPINGO OOPHORECTOMY;  Surgeon: Allena Katz, MD;  Location: [facility];  Service: Gynecology;  Laterality: Bilateral;   Family History  Problem Relation Age of Onset  . Diabetes Mother   . Asthma Mother   . Seasonal affective disorder Mother   . Clotting disorder Mother     excess platelets  . Heart disease Mother   . COPD Mother   . Kidney cancer Father   . Hypertension Father   . Hyperlipidemia Maternal Grandmother   . Stroke Maternal Grandmother 10  mini strokes  . Pancreatic cancer Paternal Grandmother   . Heart failure Paternal Grandmother   . Heart failure Maternal Grandfather     MI @ 40  . Alzheimer's disease Paternal Grandfather   . Seasonal affective disorder Sister   . GI problems Brother     GI bleed  . Heart disease Paternal Uncle    Allergies as of 07/27/2016   No Known Allergies     Medication List       Accurate as of 07/27/16  8:16 AM. Always use your most recent med list.          amoxicillin 875 MG tablet Commonly known as:  AMOXIL Take by mouth.   cycloSPORINE 0.05 % ophthalmic emulsion Commonly known as:  RESTASIS 1 drop 2 (two) times  daily.   FISH OIL + D3 PO Take 1 capsule by mouth daily.   ibuprofen 600 MG tablet Commonly known as:  ADVIL,MOTRIN Take 1 tablet (600 mg total) by mouth every 6 (six) hours as needed (mild pain).   Krill Oil 1000 MG Caps Take 1 capsule by mouth.   magnesium 30 MG tablet Take 60 mg by mouth 2 (two) times daily.   polyethylene glycol packet Commonly known as:  MIRALAX / GLYCOLAX Take 17 g by mouth daily as needed. Reported on 12/23/2015   selenium 50 MCG Tabs tablet Take 50 mcg by mouth daily.   SYNTHROID 125 MCG tablet Generic drug:  levothyroxine Take 1 tablet (125 mcg total) by mouth daily before breakfast.   Zinc 30 MG Caps Take 1 capsule by mouth.       No results found for this or any previous visit (from the past 24 hour(s)). No results found.   ROS: Negative, with the exception of above mentioned in HPI   Objective:  BP 117/75 (BP Location: Right Arm, Patient Position: Sitting, Cuff Size: Large)   Pulse 67   Temp 97.4 F (36.3 C)   Resp 20   Ht 5\' 9"  (1.753 m)   Wt 239 lb 4 oz (108.5 kg)   LMP 02/06/2012   SpO2 99%   BMI 35.33 kg/m  Body mass index is 35.33 kg/m. Gen: Afebrile. No acute distress. Nontoxic in appearance, well developed, well nourished. Obese caucasian female.  HENT: AT. [place].  Mildly dry MM, no oral lesions. Bilateral nares WNL. Throat without erythema or exudates. No cough or hoarseness.  Eyes:Pupils Equal Round Reactive to light, Extraocular movements intact,  Conjunctiva without redness, discharge or icterus.  CV: RRR Skin: scaly, mildly erythematic rash right calf.  No  purpura or petechiae.  Neuro: Normal gait. PERLA. EOMi. Alert. Oriented x3   Assessment/Plan: Allison Compton is a 50 y.o. female present for  OV for  Chronically dry eyes, bilateral Dry mouth - Sjogren's syndrome discussed with pt In great detail today. I agree to pursue workup for her, but not all the labs her ophthalmologist ordered will be ordered here. She  also will be required to call her ophthalmologist to let them know that her workup will be completed here, so they are not expecting the labs. She isn't really using Restasis and the Biotin. If labs are positive, will send to rheumatology. - Rheumatoid Factor - Cyclic citrul peptide antibody, IgG - ANA, IFA Comprehensive Panel - Patient was provided with resources on Sjogren's syndrome from patient's education through up-to-date. -She'll be called with results once available.  Hip injury, subsequent encounter: -Patient states she still has problems with her right  hip after what was believed to be a hip flexor strain. She had normal x-rays. Initial encounter for that was almost 4 months ago.  She has seen [facility] orthopedics in the past. Placed referral to them to further evaluate her hip pain.    Pt had labs drawn for another provider during this visit, thyroid disease is not covered during this office visit or this provider.   Greater than 40 minutes spent with patient, >50% of time spent face to face    electronically signed by:  Howard Pouch, DO  [place]

## 2016-07-30 ENCOUNTER — Telehealth: Payer: Self-pay | Admitting: Internal Medicine

## 2016-07-30 ENCOUNTER — Telehealth: Payer: Self-pay | Admitting: Family Medicine

## 2016-07-30 LAB — ANA, IFA COMPREHENSIVE PANEL
Anti Nuclear Antibody(ANA): NEGATIVE
ENA SM Ab Ser-aCnc: <1
SM/RNP: NEGATIVE
SSA (RO) (ENA) ANTIBODY, IGG: NEGATIVE
SSB (La) (ENA) Antibody, IgG: <1
Scleroderma (Scl-70) (ENA) Antibody, IgG: <1
ds DNA Ab: <1 IU/mL

## 2016-07-30 LAB — CYCLIC CITRUL PEPTIDE ANTIBODY, IGG: Cyclic Citrullin Peptide Ab: <16 Units

## 2016-07-30 LAB — RHEUMATOID FACTOR: Rhuematoid fact SerPl-aCnc: <14 IU/mL (ref <14)

## 2016-07-30 MED ORDER — SYNTHROID 125 MCG PO TABS: 125.0000 ug | ORAL_TABLET | Freq: Every day | ORAL | 0 refills | Status: DC

## 2016-07-30 NOTE — Telephone Encounter (Signed)
Pt called in and said that she needs her Synthroid 125 MCG sent to [facility], she is almost completely out.

## 2016-07-30 NOTE — Telephone Encounter (Signed)
Please call pt: Her Sjogren labs are negative, she can view them in her Mychart, print them and provide a copy to her ophthalmologist.

## 2016-07-31 NOTE — Telephone Encounter (Signed)
Detailed message left on voice mail, per DPR.

## 2016-08-25 ENCOUNTER — Other Ambulatory Visit: Payer: Self-pay | Admitting: Internal Medicine

## 2016-10-15 ENCOUNTER — Other Ambulatory Visit: Payer: Self-pay | Admitting: Internal Medicine

## 2016-10-15 NOTE — Telephone Encounter (Signed)
Yes, OK 

## 2016-10-15 NOTE — Telephone Encounter (Signed)
Okay to refill? Last seen 12/2015. Thank you!

## 2016-10-17 ENCOUNTER — Encounter: Payer: Self-pay | Admitting: *Deleted

## 2016-10-24 ENCOUNTER — Encounter: Payer: Self-pay | Admitting: Internal Medicine

## 2016-10-26 ENCOUNTER — Other Ambulatory Visit: Payer: Self-pay | Admitting: Internal Medicine

## 2016-10-26 DIAGNOSIS — E038 Other specified hypothyroidism: Secondary | ICD-10-CM

## 2016-10-26 DIAGNOSIS — R7303 Prediabetes: Secondary | ICD-10-CM

## 2016-10-26 DIAGNOSIS — E559 Vitamin D deficiency, unspecified: Secondary | ICD-10-CM

## 2016-10-26 DIAGNOSIS — R5383 Other fatigue: Secondary | ICD-10-CM

## 2016-11-20 ENCOUNTER — Other Ambulatory Visit (INDEPENDENT_AMBULATORY_CARE_PROVIDER_SITE_OTHER): Payer: 59

## 2016-11-20 DIAGNOSIS — E559 Vitamin D deficiency, unspecified: Secondary | ICD-10-CM | POA: Diagnosis not present

## 2016-11-20 DIAGNOSIS — E038 Other specified hypothyroidism: Secondary | ICD-10-CM | POA: Diagnosis not present

## 2016-11-20 DIAGNOSIS — R7303 Prediabetes: Secondary | ICD-10-CM

## 2016-11-20 LAB — VITAMIN D 25 HYDROXY (VIT D DEFICIENCY, FRACTURES): VITD: 48.22 ng/mL (ref 30.00–100.00)

## 2016-11-20 LAB — T4, FREE: FREE T4: 1.28 ng/dL (ref 0.60–1.60)

## 2016-11-20 LAB — TSH: TSH: 0.81 u[IU]/mL (ref 0.35–4.50)

## 2016-11-20 LAB — HEMOGLOBIN A1C: HEMOGLOBIN A1C: 6.2 % (ref 4.6–6.5)

## 2016-11-21 ENCOUNTER — Other Ambulatory Visit (INDEPENDENT_AMBULATORY_CARE_PROVIDER_SITE_OTHER): Payer: 59

## 2016-11-21 DIAGNOSIS — R7303 Prediabetes: Secondary | ICD-10-CM

## 2016-11-21 LAB — LIPID PANEL
Cholesterol: 217 mg/dL — ABNORMAL HIGH (ref 0–200)
HDL: 50.4 mg/dL (ref >39.00)
LDL CALC: 143 mg/dL — AB (ref 0–99)
NONHDL: 166.56
Total CHOL/HDL Ratio: 4
Triglycerides: 119 mg/dL (ref 0.0–149.0)
VLDL: 23.8 mg/dL (ref 0.0–40.0)

## 2016-11-21 LAB — THYROID PEROXIDASE ANTIBODY: Thyroperoxidase Ab SerPl-aCnc: 328 IU/mL — ABNORMAL HIGH (ref <9)

## 2016-12-04 ENCOUNTER — Other Ambulatory Visit: Payer: Self-pay

## 2016-12-04 ENCOUNTER — Other Ambulatory Visit: Payer: Self-pay | Admitting: Internal Medicine

## 2016-12-04 ENCOUNTER — Telehealth: Payer: Self-pay | Admitting: Internal Medicine

## 2016-12-04 MED ORDER — SYNTHROID 125 MCG PO TABS: ORAL_TABLET | ORAL | 0 refills | Status: DC

## 2016-12-04 NOTE — Telephone Encounter (Signed)
Submitted for 30 day supply.

## 2016-12-04 NOTE — Telephone Encounter (Signed)
Pt's mail order pharmacy did not ship out her Synthroid in time and she requests that we send in a month's supply of her Synthroid to the [facility], and preferably asap so they can get it approved with the override.

## 2017-04-16 ENCOUNTER — Telehealth: Payer: Self-pay | Admitting: Internal Medicine

## 2017-04-16 NOTE — Telephone Encounter (Signed)
Patient called to make f/u appointment with Dr. Renne Crigler and needed to have orders put in to do labs prior. Patient made appointment for 06/14/17 @ 9:30am. Please call patient and advise.

## 2017-04-17 NOTE — Telephone Encounter (Signed)
Please advise on what labs need to be put in for this patient

## 2017-04-18 ENCOUNTER — Other Ambulatory Visit: Payer: Self-pay

## 2017-04-18 DIAGNOSIS — R7303 Prediabetes: Secondary | ICD-10-CM

## 2017-04-18 DIAGNOSIS — E038 Other specified hypothyroidism: Secondary | ICD-10-CM

## 2017-04-18 NOTE — Telephone Encounter (Signed)
Orders placed.

## 2017-04-18 NOTE — Telephone Encounter (Signed)
Allison Compton, can you please order: TSH, free t4, vitamin D and HbA1c?

## 2017-04-29 ENCOUNTER — Other Ambulatory Visit: Payer: Self-pay | Admitting: Internal Medicine

## 2017-05-17 ENCOUNTER — Ambulatory Visit (INDEPENDENT_AMBULATORY_CARE_PROVIDER_SITE_OTHER): Payer: 59 | Admitting: Family Medicine

## 2017-05-17 ENCOUNTER — Encounter: Payer: Self-pay | Admitting: Family Medicine

## 2017-05-17 VITALS — BP 122/82 | HR 77 | Temp 98.2°F | Resp 20 | Wt 227.0 lb

## 2017-05-17 DIAGNOSIS — R29898 Other symptoms and signs involving the musculoskeletal system: Secondary | ICD-10-CM | POA: Diagnosis not present

## 2017-05-17 DIAGNOSIS — L659 Nonscarring hair loss, unspecified: Secondary | ICD-10-CM | POA: Diagnosis not present

## 2017-05-17 DIAGNOSIS — R7303 Prediabetes: Secondary | ICD-10-CM

## 2017-05-17 DIAGNOSIS — R5383 Other fatigue: Secondary | ICD-10-CM | POA: Diagnosis not present

## 2017-05-17 DIAGNOSIS — E038 Other specified hypothyroidism: Secondary | ICD-10-CM | POA: Diagnosis not present

## 2017-05-17 DIAGNOSIS — R1013 Epigastric pain: Secondary | ICD-10-CM | POA: Diagnosis not present

## 2017-05-17 DIAGNOSIS — G8220 Paraplegia, unspecified: Secondary | ICD-10-CM | POA: Insufficient documentation

## 2017-05-17 DIAGNOSIS — E063 Autoimmune thyroiditis: Secondary | ICD-10-CM | POA: Diagnosis not present

## 2017-05-17 DIAGNOSIS — E559 Vitamin D deficiency, unspecified: Secondary | ICD-10-CM

## 2017-05-17 DIAGNOSIS — K625 Hemorrhage of anus and rectum: Secondary | ICD-10-CM | POA: Diagnosis not present

## 2017-05-17 DIAGNOSIS — R454 Irritability and anger: Secondary | ICD-10-CM | POA: Diagnosis not present

## 2017-05-17 DIAGNOSIS — R232 Flushing: Secondary | ICD-10-CM

## 2017-05-17 HISTORY — DX: Hemorrhage of anus and rectum: K62.5

## 2017-05-17 HISTORY — DX: Paraplegia, unspecified: G82.20

## 2017-05-17 NOTE — Progress Notes (Signed)
Allison Compton , 20-May-1966, 51 y.o., female MRN: [id number] Patient Care Team    Relationship Specialty Notifications Start End  Ma Hillock, DO PCP - General Family Medicine  12/22/15   Philemon Kingdom, MD Consulting Physician Internal Medicine  12/23/15    Comment: prediabetes/thyroid    Chief Complaint  Patient presents with  . Fatigue    hair loss ,hot flashes  . Leg Pain    x 2 weeks     Subjective: Pt presents for an OV with complaints of Fatigue, hair loss, bilateral leg weakness, right posterior thigh discomfort, increased stress, increased epigastric discomfort sometimes in the middle, sometimes on the left or right side, increased irritability and rectal bleeding.   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 She does report compliance with her Synthroid 125 g. She reports she just does not feel like herself. She took 2 days off work just to sleep, and did not feel rested. She denies chest pain, shortness of breath, lower extremity edema, dyspnea, orthopnea, headache or dizziness.  Depression screen PHQ 2/9 12/22/2015  Decreased Interest 0  Down, Depressed, Hopeless 0  PHQ - 2 Score 0    No Known Allergies Social History  Substance Use Topics  . Smoking status: Former Smoker    Quit date: 08/13/1985  . Smokeless tobacco: Never Used     Comment: age [age], up to 1 ppd  . Alcohol use Yes     Comment: rarely once a month or less   Past Medical History:  Diagnosis Date  . Anal tear   . 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 Eczema   . Edema of both legs   . Fibroids   . Hashimoto's disease   . Hyperlipidemia   . Pre-diabetes    Pt states she is "borderline" for yrs.  . Seasonal affective disorder ([place])    PMH of   Past Surgical History:  Procedure Laterality Date  . ABDOMINAL HYSTERECTOMY  02/10/2014   Dr Gaetano Net  . CERVICAL BIOPSY  W/ LOOP ELECTRODE EXCISION  2002    . cervical dysplasia  2000   LEEP  . LAPAROSCOPIC ASSISTED VAGINAL HYSTERECTOMY N/A 02/10/2014   Procedure: LAPAROSCOPIC ASSISTED VAGINAL HYSTERECTOMY;  Surgeon: Allena Katz, MD;  Location: [facility];  Service: Gynecology;  Laterality: N/A;  . SALPINGOOPHORECTOMY Bilateral 02/10/2014   Procedure: SALPINGO OOPHORECTOMY;  Surgeon: Allena Katz, MD;  Location: [facility];  Service: Gynecology;  Laterality: Bilateral;   Family History  Problem Relation Age of Onset  . Diabetes Mother   . Asthma Mother   . Seasonal affective disorder Mother   . Clotting disorder Mother        excess platelets  . Heart disease Mother   . COPD Mother   . Kidney cancer Father   . Hypertension Father   . Hyperlipidemia Maternal Grandmother   . Stroke Maternal Grandmother 75       mini strokes  . Pancreatic cancer Paternal Grandmother   . Heart failure Paternal Grandmother   . Heart failure Maternal Grandfather        MI @ 81  . Alzheimer's disease Paternal Grandfather   . Seasonal affective disorder Sister   . GI problems Brother        GI bleed  . Heart disease Paternal Uncle    Allergies as of 05/17/2017   No Known  Allergies     Medication List       Accurate as of 05/17/17  3:06 PM. Always use your most recent med list.          FISH OIL + D3 PO Take 1 capsule by mouth daily.   ibuprofen 600 MG tablet Commonly known as:  ADVIL,MOTRIN Take 1 tablet (600 mg total) by mouth every 6 (six) hours as needed (mild pain).   Krill Oil 1000 MG Caps Take 1 capsule by mouth.   magnesium 30 MG tablet Take 60 mg by mouth 2 (two) times daily.   polyethylene glycol packet Commonly known as:  MIRALAX / GLYCOLAX Take 17 g by mouth daily as needed. Reported on 12/23/2015   selenium 50 MCG Tabs tablet Take 50 mcg by mouth daily.   SYNTHROID 125 MCG tablet Generic drug:  levothyroxine TAKE 1 TABLET BY MOUTH  DAILY BEFORE BREAKFAST   Zinc 30 MG Caps Take 1 capsule by mouth.       All past  medical history, surgical history, allergies, family history, immunizations andmedications were updated in the EMR today and reviewed under the history and medication portions of their EMR.     ROS: Negative, with the exception of above mentioned in HPI   Objective:  BP 122/82 (BP Location: Left Arm, Patient Position: Sitting, Cuff Size: Large)   Pulse 77   Temp 98.2 F (36.8 C)   Resp 20   Wt 227 lb (103 kg)   LMP 02/06/2012   SpO2 98%   BMI 33.52 kg/m  Body mass index is 33.52 kg/m. Gen: Afebrile. No acute distress. Nontoxic in appearance, well developed, well nourished.  HENT: AT. [place]. MMM, no oral lesions.  No cough, no hoarseness. Eyes:Pupils Equal Round Reactive to light, Extraocular movements intact,  Conjunctiva without redness, discharge or icterus. Neck/lymp/endocrine: Supple, no lymphadenopathy, no thyromegaly. CV: RRR no murmur, no edema Chest: CTAB, no wheeze or crackles. Good air movement, normal resp effort.  Abd: Soft. Obese. NTND. BS present. No Masses palpated. No rebound or guarding.  Skin: No rashes, purpura or petechiae.  Neuro:  Normal gait. PERLA. EOMi. Alert. Oriented x3  Psych: Normal affect, dress and demeanor. Normal speech. Normal thought content and judgment.  No exam data present No results found. No results found for this or any previous visit (from the past 24 hour(s)).  Assessment/Plan: Allison Compton is a 51 y.o. female present for OV for  Epigastric abdominal pain - Consider anxiety as source. Watch for food triggers. - start over the counter PPI - H. pylori antibody, IgG; Future  Other fatigue/hypothyroidism/bilateral leg weakness/hot flashes/irritable  Prediabetes/hair loss - Discussed this is likely multifactorial in nature. Could also be secondary to increased anxiety.  - HgB A1c - CBC - Comp Met (CMET) - Sedimentation rate - B12 - Iron, TIBC and Ferritin Panel - Follow labs normal, and patient would like to discuss medication  options for anxiety, she is encouraged to make an appointment to discuss further.  Rectal bleeding - Briefly mentioned rectal bleeding 1. Occult cards provided. Colonoscopy completed 2015 was normal, also completed for rectal bleeding. - POC Hemoccult Bld/Stl (3-Cd Home Screen); Future  Vitamin D deficiency - Check levels today, restart vitamin D. - Vitamin D (25 hydroxy)    Reviewed expectations re: course of current medical issues.  Discussed self-management of symptoms.  Outlined signs and symptoms indicating need for more acute intervention.  Patient verbalized understanding and all questions were answered.  Patient received  an Scientific laboratory technician.    No orders of the defined types were placed in this encounter.    Note is dictated utilizing voice recognition software. Although note has been proof read prior to signing, occasional typographical errors still can be missed. If any questions arise, please do not hesitate to call for verification.   electronically signed by:  Howard Pouch, DO  [place]

## 2017-05-17 NOTE — Patient Instructions (Signed)
We will call you with the labs once available.  Complete cards at home and return.   Restart your vitamins.

## 2017-05-18 LAB — CBC
HCT: 42.8 % (ref 35.0–45.0)
Hemoglobin: 14.3 g/dL (ref 11.7–15.5)
MCH: 27.9 pg (ref 27.0–33.0)
MCHC: 33.4 g/dL (ref 32.0–36.0)
MCV: 83.6 fL (ref 80.0–100.0)
MPV: 11.1 fL (ref 7.5–12.5)
PLATELETS: 235 10*3/uL (ref 140–400)
RBC: 5.12 10*6/uL — AB (ref 3.80–5.10)
RDW: 13.2 % (ref 11.0–15.0)
WBC: 9.1 10*3/uL (ref 3.8–10.8)

## 2017-05-18 LAB — IRON,TIBC AND FERRITIN PANEL
%SAT: 11 % (ref 11–50)
Ferritin: 127 ng/mL (ref 10–232)
IRON: 44 ug/dL — AB (ref 45–160)
TIBC: 404 ug/dL (ref 250–450)

## 2017-05-18 LAB — COMPREHENSIVE METABOLIC PANEL
AG Ratio: 1.5 (calc) (ref 1.0–2.5)
ALBUMIN MSPROF: 4.3 g/dL (ref 3.6–5.1)
ALKALINE PHOSPHATASE (APISO): 105 U/L (ref 33–130)
ALT: 19 U/L (ref 6–29)
AST: 17 U/L (ref 10–35)
BILIRUBIN TOTAL: 0.3 mg/dL (ref 0.2–1.2)
BUN: 16 mg/dL (ref 7–25)
CALCIUM: 9.3 mg/dL (ref 8.6–10.4)
CHLORIDE: 103 mmol/L (ref 98–110)
CO2: 25 mmol/L (ref 20–32)
Creat: 0.96 mg/dL (ref 0.50–1.05)
GLOBULIN: 2.8 g/dL (ref 1.9–3.7)
Glucose, Bld: 105 mg/dL — ABNORMAL HIGH (ref 65–99)
Potassium: 4.3 mmol/L (ref 3.5–5.3)
Sodium: 140 mmol/L (ref 135–146)
Total Protein: 7.1 g/dL (ref 6.1–8.1)

## 2017-05-18 LAB — HEMOGLOBIN A1C
Hgb A1c MFr Bld: 5.8 % of total Hgb — ABNORMAL HIGH (ref <5.7)
Mean Plasma Glucose: 120 (calc)
eAG (mmol/L): 6.6 (calc)

## 2017-05-18 LAB — SEDIMENTATION RATE: SED RATE: 33 mm/h — AB (ref 0–30)

## 2017-05-18 LAB — TSH: TSH: 0.9 mIU/L

## 2017-05-18 LAB — VITAMIN D 25 HYDROXY (VIT D DEFICIENCY, FRACTURES): VIT D 25 HYDROXY: 36 ng/mL (ref 30–100)

## 2017-05-18 LAB — VITAMIN B12: VITAMIN B 12: 736 pg/mL (ref 200–1100)

## 2017-05-18 LAB — T4, FREE: FREE T4: 1.5 ng/dL (ref 0.8–1.8)

## 2017-05-20 ENCOUNTER — Other Ambulatory Visit (INDEPENDENT_AMBULATORY_CARE_PROVIDER_SITE_OTHER): Payer: 59

## 2017-05-20 DIAGNOSIS — R1013 Epigastric pain: Secondary | ICD-10-CM | POA: Diagnosis not present

## 2017-05-20 LAB — H. PYLORI ANTIBODY, IGG: H PYLORI IGG: NEGATIVE

## 2017-05-21 ENCOUNTER — Other Ambulatory Visit (INDEPENDENT_AMBULATORY_CARE_PROVIDER_SITE_OTHER): Payer: 59

## 2017-05-21 DIAGNOSIS — E782 Mixed hyperlipidemia: Secondary | ICD-10-CM

## 2017-05-21 LAB — HEMOCCULT SLIDES (X 3 CARDS)
Fecal Occult Blood: NEGATIVE
OCCULT 1: NEGATIVE
OCCULT 2: NEGATIVE
OCCULT 3: NEGATIVE
OCCULT 4: NEGATIVE
OCCULT 5: NEGATIVE

## 2017-05-23 ENCOUNTER — Telehealth: Payer: Self-pay | Admitting: Family Medicine

## 2017-05-23 DIAGNOSIS — K625 Hemorrhage of anus and rectum: Secondary | ICD-10-CM

## 2017-05-23 DIAGNOSIS — E611 Iron deficiency: Secondary | ICD-10-CM

## 2017-05-23 NOTE — Telephone Encounter (Signed)
Please call pt: - I would recommend she take OTC iron supplement twice daily. I would also have her see her GI doctor (Dr. Keenan Bachelor in [facility]) to follow up. Her hemoccult cards were negative, but she did admit to seeing some rectal bleeding and new onset of low iron is typically from a GI cause. She should not need a referral since she saw them 3 years ago, but if she does we can place one for her.

## 2017-05-23 NOTE — Telephone Encounter (Signed)
-----   Message from Ralph Dowdy, CMA sent at 05/22/2017 10:35 AM EDT ----- Patient advised of lab results, expressed understanding.  She states she hasn't taken any iron supplements except prior to hysterectomy and she was not sure name or dosage of iron supplements.  Please advise.

## 2017-05-23 NOTE — Telephone Encounter (Signed)
Spoke with patient reviewed instructions. Patient states here GI Dr is no longer there and she would like a referral to somewhere else. Please advise

## 2017-05-23 NOTE — Telephone Encounter (Signed)
Referral to GI placed

## 2017-06-13 ENCOUNTER — Telehealth: Payer: Self-pay | Admitting: *Deleted

## 2017-06-13 ENCOUNTER — Telehealth: Payer: Self-pay

## 2017-06-13 NOTE — Telephone Encounter (Signed)
Called patient, LVM of MD note to contact PCP or Urgent Care. Left call back number if any questions.

## 2017-06-13 NOTE — Telephone Encounter (Signed)
Patient called in states that she is having issues with hemorrhoids, she states it has turned into an abcess, and believes it has become infected. She would like a referral    To GI to have them look at it. I advised patient to contact PCP, patient states she did but she would have to be seen first. I advised I would let Dr.Gherghe know about this, but it would be best to go through PCP.

## 2017-06-13 NOTE — Telephone Encounter (Signed)
Patient called and states she has an anal abcess that is deep under the skin she is requesting referral somewhere. Scheduled patient to be seen tomorrow for evaluation. Advised patient if symptoms get worse fgo to ED or UC . Patient verbalized understanding.

## 2017-06-13 NOTE — Telephone Encounter (Signed)
Called patient and advised to contact her PCP. Left call back number.

## 2017-06-13 NOTE — Telephone Encounter (Signed)
Unfortunately, this needs to come from PCP or Urgent Care as I do not address primary care issues.

## 2017-06-14 ENCOUNTER — Encounter: Payer: Self-pay | Admitting: Internal Medicine

## 2017-06-14 ENCOUNTER — Ambulatory Visit (INDEPENDENT_AMBULATORY_CARE_PROVIDER_SITE_OTHER): Payer: 59 | Admitting: Family Medicine

## 2017-06-14 ENCOUNTER — Ambulatory Visit (INDEPENDENT_AMBULATORY_CARE_PROVIDER_SITE_OTHER): Payer: 59 | Admitting: Internal Medicine

## 2017-06-14 ENCOUNTER — Encounter: Payer: Self-pay | Admitting: Family Medicine

## 2017-06-14 VITALS — BP 132/84 | HR 70 | Temp 97.6°F | Ht 69.0 in | Wt 226.4 lb

## 2017-06-14 VITALS — BP 101/69 | HR 67 | Temp 97.4°F | Resp 20 | Wt 225.5 lb

## 2017-06-14 DIAGNOSIS — K645 Perianal venous thrombosis: Secondary | ICD-10-CM | POA: Diagnosis not present

## 2017-06-14 DIAGNOSIS — R7303 Prediabetes: Secondary | ICD-10-CM

## 2017-06-14 DIAGNOSIS — E038 Other specified hypothyroidism: Secondary | ICD-10-CM

## 2017-06-14 LAB — POC HEMOCCULT BLD/STL (OFFICE/1-CARD/DIAGNOSTIC): Fecal Occult Blood, POC: NEGATIVE

## 2017-06-14 MED ORDER — AMOXICILLIN-POT CLAVULANATE 875-125 MG PO TABS: 1.0000 | ORAL_TABLET | Freq: Two times a day (BID) | ORAL | 0 refills | Status: DC

## 2017-06-14 NOTE — Patient Instructions (Addendum)
Please have the Antibodies drawn at the next lab appt.  Please continue Synthroid 125 mcg dailt.  Take the thyroid hormone every day, with water, at least 30 minutes before breakfast, separated by at least 4 hours from: - acid reflux medications - calcium - iron - multivitamins  Please come back for a follow-up appointment in 1 year.

## 2017-06-14 NOTE — Progress Notes (Signed)
Patient ID: Allison Compton, female   DOB: 1966/07/12, 51 y.o.   MRN: [id number]   HPI  Allison Compton is a 51 y.o.-year-old female, returning for f/u for Hashimoto's hypothyroidism and prediabetes. Last visit 1 year and 2 mo ago.  Before last visit >> eliminated gluten, dairy, alcohol, processed foods, caffeine from diet >> lost 14 lbs since last visit.  Pt. has been dx with hypothyroidism in 04/2015; on Levothyroxine.  Pt is on Synthroid 125 mcg daily:  - in am - fasting - at least 30 min from b'fast - no Ca, Fe, MVI, PPIs - + B complex   I reviewed pt's thyroid tests: Lab Results  Component Value Date   TSH 0.90 05/17/2017   TSH 0.81 11/20/2016   TSH 1.74 07/27/2016   TSH 6.17 (H) 05/28/2016   TSH 4.93 (H) 03/13/2016   TSH 7.92 (H) 12/19/2015   TSH 16.71 (H) 09/30/2015   TSH 7.90 (H) 05/30/2015   TSH 48.31 (H) 04/19/2015   TSH 3.60 02/26/2014   FREET4 1.5 05/17/2017   FREET4 1.28 11/20/2016   FREET4 1.29 07/27/2016   FREET4 1.02 05/28/2016   FREET4 0.92 03/13/2016   FREET4 0.90 12/19/2015   FREET4 0.88 09/30/2015   FREET4 0.75 05/30/2015   FREET4 0.55 (L) 04/19/2015   FREET4 0.7 11/29/2008    She has Hashimoto's thyroiditis: Component     Latest Ref Rng & Units 05/30/2015 05/28/2016 11/20/2016  Thyroperoxidase Ab SerPl-aCnc     <9 IU/mL 782 (H) 652 (H) 328 (H)  Thyroglobulin Ab     <2 IU/mL 2 (H) 1    Pt denies: - feeling nodules in neck - hoarseness - dysphagia - choking - SOB with lying down  She has + FH of thyroid disorders in: several cousins. No FH of thyroid cancer. No h/o radiation tx to head or neck.  No seaweed or kelp. No recent contrast studies. No herbal supplements. No Biotin use. No recent steroids use.   Prediabetes:  Last HbA1c: Lab Results  Component Value Date   HGBA1C 5.8 (H) 05/17/2017   HGBA1C 6.2 11/20/2016   HGBA1C 6.0 05/28/2016   No CKD: Lab Results  Component Value Date   BUN 16 05/17/2017   Lab Results  Component  Value Date   CREATININE 0.96 05/17/2017   Lab Results  Component Value Date   MICRALBCREAT 21.5 09/29/2008   MICRALBCREAT 2.0 02/17/2008   MICRALBCREAT 3.9 05/29/2007   Not checking sugars at home.  She also has a history of hysterectomy in 2015. She has HL.  Husband with [employer] >> uses a product called Birm.  ROS: Constitutional: + weight loss, + fatigue, no subjective hyperthermia, no subjective hypothermia Eyes: no blurry vision, + xerophthalmia ENT: no sore throat, no nodules palpated in throat, no dysphagia, no odynophagia, no hoarseness Cardiovascular: no CP/no SOB/no palpitations/no leg swelling Respiratory: no cough/no SOB/no wheezing Gastrointestinal: no N/no V/no D/no C/no acid reflux Musculoskeletal: no muscle aches/no joint aches Skin: no rashes, + hair loss Neurological: no tremors/no numbness/no tingling/no dizziness  I reviewed pt's medications, allergies, PMH, social hx, family hx, and changes were documented in the history of present illness. Otherwise, unchanged from my initial visit note.   Past Medical History:  Diagnosis Date  . Anal tear   . 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 Eczema   . Edema of both legs   . Fibroids   . Hashimoto's disease   . Hyperlipidemia   .  Pre-diabetes    Pt states she is "borderline" for yrs.  . Seasonal affective disorder ([place])    PMH of   Past Surgical History:  Procedure Laterality Date  . ABDOMINAL HYSTERECTOMY  02/10/2014   Dr Gaetano Net  . CERVICAL BIOPSY  W/ LOOP ELECTRODE EXCISION  2002  . cervical dysplasia  2000   LEEP  . LAPAROSCOPIC ASSISTED VAGINAL HYSTERECTOMY N/A 02/10/2014   Procedure: LAPAROSCOPIC ASSISTED VAGINAL HYSTERECTOMY;  Surgeon: Allena Katz, MD;  Location: [facility];  Service: Gynecology;  Laterality: N/A;  . SALPINGOOPHORECTOMY Bilateral 02/10/2014   Procedure: SALPINGO OOPHORECTOMY;  Surgeon: Allena Katz, MD;  Location: [facility];  Service: Gynecology;  Laterality:  Bilateral;   Social History   Social History  . Marital Status: Married    Spouse Name: N/A  . Number of Children: 1   Occupational History  . n/a   Social History Main Topics  . Smoking status: Former Smoker    Quit date: 08/13/1985  . Smokeless tobacco: Not on file     Comment: age [age], up to 1 ppd  . Alcohol Use: Yes     Comment: rarely once a month or less  . Drug Use: No   Current Outpatient Prescriptions on File Prior to Visit  Medication Sig Dispense Refill  . Fish Oil-Cholecalciferol (FISH OIL + D3 PO) Take 1 capsule by mouth daily.    Marland Kitchen ibuprofen (ADVIL,MOTRIN) 600 MG tablet Take 1 tablet (600 mg total) by mouth every 6 (six) hours as needed (mild pain). 30 tablet 0  . Krill Oil 1000 MG CAPS Take 1 capsule by mouth.    . magnesium 30 MG tablet Take 60 mg by mouth 2 (two) times daily.     . polyethylene glycol (MIRALAX / GLYCOLAX) packet Take 17 g by mouth daily as needed. Reported on 12/23/2015    . selenium 50 MCG TABS tablet Take 50 mcg by mouth daily.    Marland Kitchen SYNTHROID 125 MCG tablet TAKE 1 TABLET BY MOUTH  DAILY BEFORE BREAKFAST 90 tablet 1  . Zinc 30 MG CAPS Take 1 capsule by mouth.     No current facility-administered medications on file prior to visit.    No Known Allergies Family History  Problem Relation Age of Onset  . Diabetes Mother   . Asthma Mother   . Seasonal affective disorder Mother   . Clotting disorder Mother        excess platelets  . Heart disease Mother   . COPD Mother   . Kidney cancer Father   . Hypertension Father   . Hyperlipidemia Maternal Grandmother   . Stroke Maternal Grandmother 75       mini strokes  . Pancreatic cancer Paternal Grandmother   . Heart failure Paternal Grandmother   . Heart failure Maternal Grandfather        MI @ 78  . Alzheimer's disease Paternal Grandfather   . Seasonal affective disorder Sister   . GI problems Brother        GI bleed  . Heart disease Paternal Uncle    PE: BP 132/84 (BP Location:  Right Arm, Patient Position: Sitting, Cuff Size: Normal)   Pulse 70   Temp 97.6 F (36.4 C) (Oral)   Ht 5\' 9"  (1.753 m)   Wt 226 lb 6.4 oz (102.7 kg)   LMP 02/06/2012   SpO2 98%   BMI 33.43 kg/m  Wt Readings from Last 3 Encounters:  06/14/17 225 lb 8 oz (  102.3 kg)  05/17/17 227 lb (103 kg)  07/27/16 239 lb 4 oz (108.5 kg)   Constitutional: overweight, in NAD Eyes: PERRLA, EOMI, no exophthalmos ENT: moist mucous membranes, no thyromegaly, no cervical lymphadenopathy Cardiovascular: RRR, No MRG Respiratory: CTA B Gastrointestinal: abdomen soft, NT, ND, BS+ Musculoskeletal: no deformities, strength intact in all 4 Skin: moist, warm, no rashes Neurological: no tremor with outstretched hands, DTR normal in all 4  ASSESSMENT: 1. Hypothyroidism - Thyroid U/S (06/13/2015): Thyroid tissue is mildly heterogeneous but no focal nodules.  2. Prediabetes Lab Results  Component Value Date   HGBA1C 5.8 (H) 05/17/2017   3. Fatigue  PLAN:  1. Patient with Hashimoto's hypothyroidism, on levothyroxine therapy.  - latest thyroid labs reviewed with pt >> normal this mo - she continues on Synthroid 125 mcg daily - pt feels good on this dose. - we discussed about taking the thyroid hormone every day, with water, >30 minutes before breakfast, separated by >4 hours from acid reflux medications, calcium, iron, multivitamins. Pt. is taking it correctly - she would want her thyroid Ab's rechecked >> ordered  - to be checked with her next lab draw.  2. Prediabetes - HbA1c levels in the prediabetic range - congratulated her for her weight loss! - reviewed her most recent level - from this mo: 5.8% (better!)  Philemon Kingdom, MD PhD [facility] Endocrinology

## 2017-06-14 NOTE — Progress Notes (Signed)
KHRISTA Compton , 1966/08/09, 51 y.o., female MRN: [id number] Patient Care Team    Relationship Specialty Notifications Start End  Ma Hillock, DO PCP - General Family Medicine  12/22/15   Philemon Kingdom, MD Consulting Physician Internal Medicine  12/23/15    Comment: prediabetes/thyroid    Chief Complaint  Patient presents with  . Rectal Problems     Subjective: Pt presents for an OV with complaints of hemorrhoid pain of 2 weeks duration.  Associated symptoms include worsening over last 2 days. Patient denies fever, chills, nausea. She reports she feels a linear shaped bump near her anus, and she thinks it feels more like a hematoma. Yesterday it started to bleed, and she feels the pain has actually improved a little since it has "released". It is still rather tender. She is had a history of hemorrhoids, but has not experienced anything like this. She has a cream she puts over her hemorrhoids, and has been using.  Depression screen PHQ 2/9 12/22/2015  Decreased Interest 0  Down, Depressed, Hopeless 0  PHQ - 2 Score 0    No Known Allergies Social History  Substance Use Topics  . Smoking status: Former Smoker    Quit date: 08/13/1985  . Smokeless tobacco: Never Used     Comment: age [age], up to 1 ppd  . Alcohol use Yes     Comment: rarely once a month or less   Past Medical History:  Diagnosis Date  . Anal tear   . 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 Eczema   . Edema of both legs   . Fibroids   . Hashimoto's disease   . Hyperlipidemia   . Pre-diabetes    Pt states she is "borderline" for yrs.  . Seasonal affective disorder ([place])    PMH of   Past Surgical History:  Procedure Laterality Date  . ABDOMINAL HYSTERECTOMY  02/10/2014   Dr Gaetano Net  . CERVICAL BIOPSY  W/ LOOP ELECTRODE EXCISION  2002  . cervical dysplasia  2000   LEEP  . LAPAROSCOPIC ASSISTED VAGINAL HYSTERECTOMY N/A 02/10/2014   Procedure: LAPAROSCOPIC ASSISTED VAGINAL  HYSTERECTOMY;  Surgeon: Allena Katz, MD;  Location: [facility];  Service: Gynecology;  Laterality: N/A;  . SALPINGOOPHORECTOMY Bilateral 02/10/2014   Procedure: SALPINGO OOPHORECTOMY;  Surgeon: Allena Katz, MD;  Location: [facility];  Service: Gynecology;  Laterality: Bilateral;   Family History  Problem Relation Age of Onset  . Diabetes Mother   . Asthma Mother   . Seasonal affective disorder Mother   . Clotting disorder Mother        excess platelets  . Heart disease Mother   . COPD Mother   . Kidney cancer Father   . Hypertension Father   . Hyperlipidemia Maternal Grandmother   . Stroke Maternal Grandmother 75       mini strokes  . Pancreatic cancer Paternal Grandmother   . Heart failure Paternal Grandmother   . Heart failure Maternal Grandfather        MI @ 35  . Alzheimer's disease Paternal Grandfather   . Seasonal affective disorder Sister   . GI problems Brother        GI bleed  . Heart disease Paternal Uncle    Allergies as of 06/14/2017   No Known Allergies     Medication List       Accurate as of 06/14/17  8:54 AM. Always use your  most recent med list.          FISH OIL + D3 PO Take 1 capsule by mouth daily.   ibuprofen 600 MG tablet Commonly known as:  ADVIL,MOTRIN Take 1 tablet (600 mg total) by mouth every 6 (six) hours as needed (mild pain).   Krill Oil 1000 MG Caps Take 1 capsule by mouth.   magnesium 30 MG tablet Take 60 mg by mouth 2 (two) times daily.   polyethylene glycol packet Commonly known as:  MIRALAX / GLYCOLAX Take 17 g by mouth daily as needed. Reported on 12/23/2015   selenium 50 MCG Tabs tablet Take 50 mcg by mouth daily.   SYNTHROID 125 MCG tablet Generic drug:  levothyroxine TAKE 1 TABLET BY MOUTH  DAILY BEFORE BREAKFAST   Zinc 30 MG Caps Take 1 capsule by mouth.       All past medical history, surgical history, allergies, family history, immunizations andmedications were updated in the EMR today and reviewed under  the history and medication portions of their EMR.     ROS: Negative, with the exception of above mentioned in HPI   Objective:  BP 101/69 (BP Location: Left Arm, Patient Position: Sitting, Cuff Size: Large)   Pulse 67   Temp (!) 97.4 F (36.3 C)   Resp 20   Wt 225 lb 8 oz (102.3 kg)   LMP 02/06/2012   SpO2 97%   BMI 33.30 kg/m  Body mass index is 33.3 kg/m. Gen: Afebrile. No acute distress. Nontoxic in appearance, well developed, well nourished.  HENT: AT. [place].MMM Eyes:Pupils Equal Round Reactive to light, Extraocular movements intact,  Conjunctiva without redness, discharge or icterus. GU: ~ 2 inch linear raised mass posterior anal sphincter, extension into rectum. Mildly tender to palpation. No drainage appreciated. Negative FOBT. No exam data present No results found. No results found for this or any previous visit (from the past 24 hour(s)).  Assessment/Plan: [name] is a 51 y.o. female present for OV for  1. Thrombosed hemorrhoids - FOBT negative. Exam appears more consistent with a thrombosed hemorrhoid, although odd-shape/ presentation. Does not appear to be an abscess or infected, at least at this time. To be cautious we'll treat with Augmentin and refer her urgently to surgery to have further eval. Patient is aware if symptoms are worsening or signs of infection she is to report to emergency room over the weekend. - Ambulatory referral to General Surgery - POC Hemoccult Bld/Stl (1-Cd Office Dx)   Reviewed expectations re: course of current medical issues.  Discussed self-management of symptoms.  Outlined signs and symptoms indicating need for more acute intervention.  Patient verbalized understanding and all questions were answered.  Patient received an After-Visit Summary.    No orders of the defined types were placed in this encounter.    Note is dictated utilizing voice recognition software. Although note has been proof read prior to signing,  occasional typographical errors still can be missed. If any questions arise, please do not hesitate to call for verification.   electronically signed by:  Howard Pouch, DO  [place]

## 2017-06-14 NOTE — Patient Instructions (Signed)
Start Augmentin today. We will try to get you into surgery next week to be seen. Warm soaks.  If worsening pain or signs of infection (fever, chills, nausea, more pain etc) please go to ED over the weekend)    Hemorrhoids Hemorrhoids are swollen veins in and around the rectum or anus. Hemorrhoids can cause pain, itching, or bleeding. Most of the time, they do not cause serious problems. They usually get better with diet changes, lifestyle changes, and other home treatments. Follow these instructions at home: Eating and drinking  Eat foods that have fiber, such as whole grains, beans, nuts, fruits, and vegetables. Ask your doctor about taking products that have added fiber (fibersupplements).  Drink enough fluid to keep your pee (urine) clear or pale yellow. For Pain and Swelling  Take a warm-water bath (sitz bath) for 20 minutes to ease pain. Do this 3-4 times a day.  If directed, put ice on the painful area. It may be helpful to use ice between your warm baths. ? Put ice in a plastic bag. ? Place a towel between your skin and the bag. ? Leave the ice on for 20 minutes, 2-3 times a day. General instructions  Take over-the-counter and prescription medicines only as told by your doctor. ? Medicated creams and medicines that are inserted into the anus (suppositories) may be used or applied as told.  Exercise often.  Go to the bathroom when you have the urge to poop (to have a bowel movement). Do not wait.  Avoid pushing too hard (straining) when you poop.  Keep the butt area dry and clean. Use wet toilet paper or moist paper towels.  Do not sit on the toilet for a long time. Contact a doctor if:  You have any of these: ? Pain and swelling that do not get better with treatment or medicine. ? Bleeding that will not stop. ? Trouble pooping or you cannot poop. ? Pain or swelling outside the area of the hemorrhoids. This information is not intended to replace advice given to you by  your health care provider. Make sure you discuss any questions you have with your health care provider. Document Released: 05/08/2008 Document Revised: 01/05/2016 Document Reviewed: 04/13/2015 Elsevier Interactive Patient Education  [employer].

## 2017-07-12 ENCOUNTER — Encounter: Payer: Self-pay | Admitting: Family Medicine

## 2017-07-24 ENCOUNTER — Encounter: Payer: Self-pay | Admitting: Family Medicine

## 2017-08-09 ENCOUNTER — Encounter: Payer: Self-pay | Admitting: Family Medicine

## 2017-08-09 ENCOUNTER — Ambulatory Visit: Payer: Self-pay | Admitting: General Surgery

## 2017-08-09 DIAGNOSIS — K605 Anorectal fistula: Secondary | ICD-10-CM | POA: Insufficient documentation

## 2017-08-09 DIAGNOSIS — K603 Anal fistula: Secondary | ICD-10-CM | POA: Insufficient documentation

## 2017-08-09 LAB — HM MAMMOGRAPHY

## 2017-08-09 NOTE — H&P (Signed)
History of Present Illness Leighton Ruff MD; 45/80/9983 12:29 PM) The patient is a 51 year old female who presents with anal pain. 51 year old female who presents to the office for evaluation of anal drainage. She saw me on 06/18/17 and at that time she stated approximately 3 weeks prior she had a bowel movement and felt a "pop" with residual soreness and tenderness to palpation. She states she has one to 2 bowel movements a day - when she has a large bowel movement she notices pink/clear fluid draining from an opening a few centimeters from her anus. Otherwise, the drainage is minimal. She denies any rectal bleeding. Her colonoscopy at age 39 was normal.   Problem List/Past Medical Leighton Ruff, MD; 38/25/0539 12:27 PM) PERIANAL ABSCESS (K61.0) ANAL FISTULA (K60.3)  Past Surgical History Leighton Ruff, MD; 76/73/4193 12:27 PM) Hysterectomy (not due to cancer) - Complete  Diagnostic Studies History Leighton Ruff, MD; 79/09/4095 12:27 PM) Colonoscopy 1-5 years ago Mammogram 1-3 years ago Pap Smear 1-5 years ago  Allergies Leighton Ruff, MD; 35/32/9924 12:27 PM) No Known Drug Allergies [06/18/2017]:  Medication History Benjiman Core, CMA; 08/09/2017 12:06 PM) Selenium (50MCG Tablet, Oral) Active. Ibuprofen (600MG  Tablet, Oral) Active. Synthroid (125MCG Tablet, Oral) Active. Fish Oil (Oral) Specific strength unknown - Active. Krill Oil (1000MG  Capsule, Oral) Active. Magnesium (30MG  Tablet, Oral) Active. Zinc (30MG  Tablet, Oral) Active. Medications Reconciled  Social History Leighton Ruff, MD; 26/83/4196 12:27 PM) Alcohol use Occasional alcohol use. Caffeine use Tea. No drug use Tobacco use Former smoker.  Family History Leighton Ruff, MD; 22/29/7989 12:27 PM) Arthritis Mother. Depression Mother. Diabetes Mellitus Mother. Hypertension Father. Kidney Disease Father.  Pregnancy / Birth History Leighton Ruff, MD; 21/19/4174 12:27 PM) Age  at menarche 34 years. Age of menopause 70-50 Contraceptive History Oral contraceptives. Length (months) of breastfeeding 3-6 Maternal age 54-35  Other Problems Leighton Ruff, MD; 03/26/4817 12:27 PM) Hypercholesterolemia    Vitals (Armen Glenn CMA; 08/09/2017 12:02 PM) 08/09/2017 12:00 PM Weight: 233.31 lb Height: 69in Body Surface Area: 2.21 m Body Mass Index: 34.45 kg/m  Temp.: 98.11F  Pulse: 83 (Regular)  P.OX: 95% (Room air) BP: 126/78 (Sitting, Left Arm, Standard)      Physical Exam Leighton Ruff MD; 56/31/4970 12:26 PM)  General Mental Status-Alert. General Appearance-Not in acute distress. Build & Nutrition-Well nourished. Posture-Normal posture. Gait-Normal.  Head and Neck Head-normocephalic, atraumatic with no lesions or palpable masses. Trachea-midline.  Chest and Lung Exam Chest and lung exam reveals -on auscultation, normal breath sounds, no adventitious sounds and normal vocal resonance.  Cardiovascular Cardiovascular examination reveals -normal heart sounds, regular rate and rhythm with no murmurs and no digital clubbing, cyanosis, edema, increased warmth or tenderness.  Abdomen Inspection Inspection of the abdomen reveals - No Hernias. Palpation/Percussion Palpation and Percussion of the abdomen reveal - Soft, Non Tender, No Rigidity (guarding), No hepatosplenomegaly and No Palpable abdominal masses.  Rectal Anorectal Exam External - Note: Palpable cords noted in the left posterior region of the perianal area. There is a small amount of purulence that is expressed from this area. No fluctuance is noted. Fistula probe inserts into draining sinus.  Neurologic Neurologic evaluation reveals -alert and oriented x 3 with no impairment of recent or remote memory, normal attention span and ability to concentrate, normal sensation and normal coordination.  Musculoskeletal Normal Exam - Bilateral-Upper  Extremity Strength Normal and Lower Extremity Strength Normal.    Assessment & Plan Leighton Ruff MD; 26/37/8588 12:26 PM)  ANAL FISTULA (K60.3) Impression: 51 year old female  who presents for follow-up. She is status post drainage of a perirectal abscess. She continues to have perianal drainage. On exam she has a left posterior perineal external opening. A fistula probe does insert without difficulty. I think she has developed an anal fistula and I recommended an anal exam under anesthesia with fistulotomy versus seton placement. We have discussed this in detail including risk of incontinence with fistulotomy and recurrence with seton and eventual secondary procedure. I believe she understands this and is agreeable to proceed with surgery.

## 2017-08-13 HISTORY — PX: ANAL FISTULOTOMY: SHX6423

## 2017-08-17 DIAGNOSIS — M25551 Pain in right hip: Secondary | ICD-10-CM | POA: Insufficient documentation

## 2017-08-20 DIAGNOSIS — M24159 Other articular cartilage disorders, unspecified hip: Secondary | ICD-10-CM | POA: Insufficient documentation

## 2017-10-05 ENCOUNTER — Other Ambulatory Visit: Payer: Self-pay | Admitting: Internal Medicine

## 2018-01-07 ENCOUNTER — Other Ambulatory Visit: Payer: Self-pay | Admitting: Internal Medicine

## 2018-01-07 ENCOUNTER — Other Ambulatory Visit (INDEPENDENT_AMBULATORY_CARE_PROVIDER_SITE_OTHER): Payer: 59

## 2018-01-07 ENCOUNTER — Telehealth: Payer: Self-pay | Admitting: *Deleted

## 2018-01-07 DIAGNOSIS — E063 Autoimmune thyroiditis: Secondary | ICD-10-CM

## 2018-01-07 DIAGNOSIS — R7303 Prediabetes: Secondary | ICD-10-CM

## 2018-01-07 DIAGNOSIS — E038 Other specified hypothyroidism: Secondary | ICD-10-CM

## 2018-01-07 LAB — T3, FREE: T3, Free: 3.3 pg/mL (ref 2.3–4.2)

## 2018-01-07 LAB — LIPID PANEL
CHOLESTEROL: 215 mg/dL — AB (ref 0–200)
HDL: 65.3 mg/dL (ref >39.00)
LDL CALC: 122 mg/dL — AB (ref 0–99)
NonHDL: 149.86
Total CHOL/HDL Ratio: 3
Triglycerides: 137 mg/dL (ref 0.0–149.0)
VLDL: 27.4 mg/dL (ref 0.0–40.0)

## 2018-01-07 LAB — TSH: TSH: 3.57 u[IU]/mL (ref 0.35–4.50)

## 2018-01-07 LAB — HEMOGLOBIN A1C: Hgb A1c MFr Bld: 6.4 % (ref 4.6–6.5)

## 2018-01-07 LAB — T4, FREE: Free T4: 0.99 ng/dL (ref 0.60–1.60)

## 2018-01-07 NOTE — Telephone Encounter (Signed)
-----   Message from Philemon Kingdom, MD sent at 01/07/2018  9:30 AM EDT ----- Regarding: RE: labs Let's check: HbA1c, TSH, free T4, free T3, Lipid panel (this is in) and thyroid antibodies (these are in as future orders from before). Erline Levine, can you please order the rest? Ty, C ----- Message ----- From: Kaylyn Lim I Sent: 01/07/2018   8:29 AM To: Philemon Kingdom, MD Subject: labs                                           Good morning, can you tell me exactly what labs you want on this patient.  thanks

## 2018-01-07 NOTE — Telephone Encounter (Signed)
Lab orders placed.  

## 2018-01-10 LAB — THYROGLOBULIN ANTIBODY: THYROGLOBULIN AB: 1 [IU]/mL (ref <=1)

## 2018-01-10 LAB — THYROID PEROXIDASE ANTIBODY: Thyroperoxidase Ab SerPl-aCnc: 96 IU/mL — ABNORMAL HIGH (ref <9)

## 2018-03-12 ENCOUNTER — Other Ambulatory Visit: Payer: Self-pay | Admitting: Internal Medicine

## 2018-06-13 ENCOUNTER — Ambulatory Visit (INDEPENDENT_AMBULATORY_CARE_PROVIDER_SITE_OTHER): Payer: 59 | Admitting: Internal Medicine

## 2018-06-13 VITALS — BP 128/70 | HR 82 | Ht 69.0 in | Wt 235.0 lb

## 2018-06-13 DIAGNOSIS — E038 Other specified hypothyroidism: Secondary | ICD-10-CM | POA: Diagnosis not present

## 2018-06-13 DIAGNOSIS — R7303 Prediabetes: Secondary | ICD-10-CM

## 2018-06-13 DIAGNOSIS — E063 Autoimmune thyroiditis: Secondary | ICD-10-CM | POA: Diagnosis not present

## 2018-06-13 DIAGNOSIS — E785 Hyperlipidemia, unspecified: Secondary | ICD-10-CM | POA: Diagnosis not present

## 2018-06-13 LAB — LIPID PANEL
CHOL/HDL RATIO: 4
CHOLESTEROL: 225 mg/dL — AB (ref 0–200)
HDL: 52.3 mg/dL (ref >39.00)
LDL CALC: 148 mg/dL — AB (ref 0–99)
NonHDL: 172.73
TRIGLYCERIDES: 126 mg/dL (ref 0.0–149.0)
VLDL: 25.2 mg/dL (ref 0.0–40.0)

## 2018-06-13 LAB — HEMOGLOBIN A1C: Hgb A1c MFr Bld: 6.3 % (ref 4.6–6.5)

## 2018-06-13 LAB — T4, FREE: FREE T4: 1.04 ng/dL (ref 0.60–1.60)

## 2018-06-13 LAB — TSH: TSH: 2.14 u[IU]/mL (ref 0.35–4.50)

## 2018-06-13 NOTE — Progress Notes (Signed)
Patient ID: Allison Compton, female   DOB: [birth year], 52 y.o.   MRN: [id number]   HPI  Allison Compton is a 52 y.o.-year-old female,-year-old female, returning for f/u for Hashimoto's hypothyroidism and prediabetes. Last visit 1 year ago.  She developed hearing loss since last visit. On diuretics.   Before last visit, she was able to eliminate gluten, dairy, alcohol, processed foods and caffeine from her diet and lost 14 pounds. Since last visit, her weight increased to 245 lbs >> started keto diet >> started to lose weight - 10 lbs in 2 weeks.  Pt. has been dx with hypothyroidism in 04/2015; on Levothyroxine.  Pt is on levothyroxine 125 mcg daily, taken: - in am - fasting - at least 30 min from b'fast - no Ca, Fe, MVI, PPIs - stop Biotin (B complex) - on Mg, Zn, Mg  Reviewed patient's TFTs: Lab Results  Component Value Date   TSH 3.57 01/07/2018   TSH 0.90 05/17/2017   TSH 0.81 11/20/2016   TSH 1.74 07/27/2016   TSH 6.17 (H) 05/28/2016   TSH 4.93 (H) 03/13/2016   TSH 7.92 (H) 12/19/2015   TSH 16.71 (H) 09/30/2015   TSH 7.90 (H) 05/30/2015   TSH 48.31 (H) 04/19/2015   FREET4 0.99 01/07/2018   FREET4 1.5 05/17/2017   FREET4 1.28 11/20/2016   FREET4 1.29 07/27/2016   FREET4 1.02 05/28/2016   FREET4 0.92 03/13/2016   FREET4 0.90 12/19/2015   FREET4 0.88 09/30/2015   FREET4 0.75 05/30/2015   FREET4 0.55 (L) 04/19/2015    She has Hashimoto's thyroiditis: Component     Latest Ref Rng & Units 01/07/2018  Thyroperoxidase Ab SerPl-aCnc     <9 IU/mL 96 (H)  Thyroglobulin Ab     < or = 1 IU/mL 1   Component     Latest Ref Rng & Units 05/30/2015 05/28/2016 11/20/2016  Thyroperoxidase Ab SerPl-aCnc     <9 IU/mL 782 (H) 652 (H) 328 (H)  Thyroglobulin Ab     <2 IU/mL 2 (H) 1   She is on selenium 50 mcg daily.  Pt denies: - feeling nodules in neck - hoarseness - dysphagia - choking - SOB with lying down  She has + FH of thyroid disorders in: several cousins. No FH of thyroid cancer. No  h/o radiation tx to head or neck.  No seaweed or kelp. No recent contrast studies. No herbal supplements. No Biotin use. No recent steroids use.   Prediabetes: -Not on any medications for this  Reviewed HbA1c levels: Lab Results  Component Value Date   HGBA1C 6.4 01/07/2018   HGBA1C 5.8 (H) 05/17/2017   HGBA1C 6.2 11/20/2016   No CKD: Lab Results  Component Value Date   BUN 16 05/17/2017   Lab Results  Component Value Date   CREATININE 0.96 05/17/2017   No MAU: Lab Results  Component Value Date   MICRALBCREAT 21.5 09/29/2008   MICRALBCREAT 2.0 02/17/2008   MICRALBCREAT 3.9 05/29/2007   She is not checking sugars at home  + HL: Lab Results  Component Value Date   CHOL 215 (H) 01/07/2018   HDL 65.30 01/07/2018   LDLCALC 122 (H) 01/07/2018   LDLDIRECT 139.9 02/17/2008   TRIG 137.0 01/07/2018   CHOLHDL 3 01/07/2018  She is not on a statin, but is on fish oil.  She also has a history of hysterectomy in 2015.   Husband with Pr[employer] >> uses a product called Birm.  ROS: Constitutional: no weight gain/no weight  loss, no fatigue, no subjective hyperthermia, no subjective hypothermia Eyes: no blurry vision, no xerophthalmia ENT: no sore throat, + see HPI Cardiovascular: no CP/no SOB/no palpitations/no leg swelling Respiratory: no cough/no SOB/no wheezing Gastrointestinal: no N/no V/no D/no C/no acid reflux Musculoskeletal: no muscle aches/no joint aches Skin: no rashes, no hair loss Neurological: no tremors/no numbness/no tingling/no dizziness  I reviewed pt's medications, allergies, PMH, social hx, family hx, and changes were documented in the history of present illness. Otherwise, unchanged from my initial visit note.  Past Medical History:  Diagnosis Date  . Anal tear   . 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 Eczema   . Edema of both legs   . Fibroids   . Hashimoto's disease   . Hyperlipidemia   . Pre-diabetes    Pt states she is  "borderline" for yrs.  . Seasonal affective disorder ([place])    PMH of   Past Surgical History:  Procedure Laterality Date  . ABDOMINAL HYSTERECTOMY  02/10/2014   Dr Gaetano Net  . CERVICAL BIOPSY  W/ LOOP ELECTRODE EXCISION  2002  . cervical dysplasia  2000   LEEP  . LAPAROSCOPIC ASSISTED VAGINAL HYSTERECTOMY N/A 02/10/2014   Procedure: LAPAROSCOPIC ASSISTED VAGINAL HYSTERECTOMY;  Surgeon: Allena Katz, MD;  Location: [facility];  Service: Gynecology;  Laterality: N/A;  . SALPINGOOPHORECTOMY Bilateral 02/10/2014   Procedure: SALPINGO OOPHORECTOMY;  Surgeon: Allena Katz, MD;  Location: [facility];  Service: Gynecology;  Laterality: Bilateral;   Social History   Social History  . Marital Status: Married    Spouse Name: N/A  . Number of Children: 1   Occupational History  . n/a   Social History Main Topics  . Smoking status: Former Smoker    Quit date: 08/13/1985  . Smokeless tobacco: Not on file     Comment: age [age], up to 1 ppd  . Alcohol Use: Yes     Comment: rarely once a month or less  . Drug Use: No   Current Outpatient Medications on File Prior to Visit  Medication Sig Dispense Refill  . amoxicillin-clavulanate (AUGMENTIN) 875-125 MG tablet Take 1 tablet by mouth 2 (two) times daily. 14 tablet 0  . Fish Oil-Cholecalciferol (FISH OIL + D3 PO) Take 1 capsule by mouth daily.    Marland Kitchen ibuprofen (ADVIL,MOTRIN) 600 MG tablet Take 1 tablet (600 mg total) by mouth every 6 (six) hours as needed (mild pain). 30 tablet 0  . Krill Oil 1000 MG CAPS Take 1 capsule by mouth.    . magnesium 30 MG tablet Take 60 mg by mouth 2 (two) times daily.     Marland Kitchen selenium 50 MCG TABS tablet Take 50 mcg by mouth daily.    Marland Kitchen SYNTHROID 125 MCG tablet TAKE 1 TABLET BY MOUTH  DAILY BEFORE BREAKFAST 90 tablet 1  . Zinc 30 MG CAPS Take 1 capsule by mouth.     No current facility-administered medications on file prior to visit.    No Known Allergies Family History  Problem Relation Age of Onset  .  Diabetes Mother   . Asthma Mother   . Seasonal affective disorder Mother   . Clotting disorder Mother        excess platelets  . Heart disease Mother   . COPD Mother   . Kidney cancer Father   . Hypertension Father   . Hyperlipidemia Maternal Grandmother   . Stroke Maternal Grandmother 35  mini strokes  . Pancreatic cancer Paternal Grandmother   . Heart failure Paternal Grandmother   . Heart failure Maternal Grandfather        MI @ 25  . Alzheimer's disease Paternal Grandfather   . Seasonal affective disorder Sister   . GI problems Brother        GI bleed  . Heart disease Paternal Uncle    PE: BP 128/70   Pulse 82   Ht 5\' 9"  (1.753 m) Comment: measured  Wt 235 lb (106.6 kg)   LMP 02/06/2012   SpO2 98%   BMI 34.70 kg/m  Wt Readings from Last 3 Encounters:  06/13/18 235 lb (106.6 kg)  06/14/17 226 lb 6.4 oz (102.7 kg)  06/14/17 225 lb 8 oz (102.3 kg)   Constitutional: overweight, in NAD Eyes: PERRLA, EOMI, no exophthalmos ENT: moist mucous membranes, no thyromegaly, no cervical lymphadenopathy Cardiovascular: RRR, No MRG Respiratory: CTA B Gastrointestinal: abdomen soft, NT, ND, BS+ Musculoskeletal: no deformities, strength intact in all 4 Skin: moist, warm, no rashes Neurological: no tremor with outstretched hands, DTR normal in all 4  ASSESSMENT: 1. Hypothyroidism - Thyroid U/S (06/13/2015): Thyroid tissue is mildly heterogeneous but no focal nodules.  2. Prediabetes  3.  Hyperlipidemia  PLAN:  1. Patient with Hashimoto's hypothyroidism, on levothyroxine therapy - latest thyroid labs reviewed with pt >> normal at last check 5 months ago - TPO antibodies were still high at last visit, but improved - she continues on LT4 DAW 125 mcg daily - pt feels good on this dose. - we discussed about taking the thyroid hormone every day, with water, >30 minutes before breakfast, separated by >4 hours from acid reflux medications, calcium, iron, multivitamins. Pt. is  taking it correctly. - will check thyroid tests today: TSH and fT4 - If labs are abnormal, she will need to return for repeat TFTs in 1.5 months  2. Prediabetes -Latest HbA1c level was reviewed and this was 6.4%, which is higher than before, but still in the prediabetic range - today, will check an HbA1c - now on a keto diet - refuses flu shot - Return to clinic in 6 mo with sugar log   3.  Hyperlipidemia - Reviewed latest lipid panel from 12/2017: LDL better, but still high Lab Results  Component Value Date   CHOL 215 (H) 01/07/2018   HDL 65.30 01/07/2018   LDLCALC 122 (H) 01/07/2018   LDLDIRECT 139.9 02/17/2008   TRIG 137.0 01/07/2018   CHOLHDL 3 01/07/2018  -She continues fish oil, she is not on a statin.  Office Visit on 06/13/2018  Component Date Value Ref Range Status  . Hgb A1c MFr Bld 06/13/2018 6.3  4.6 - 6.5 % Final   Glycemic Control Guidelines for People with Diabetes:Non Diabetic:  <6%Goal of Therapy: <7%Additional Action Suggested:  >8%   . Cholesterol 06/13/2018 225* 0 - 200 mg/dL Final   ATP III Classification       Desirable:  < 200 mg/dL               Borderline High:  200 - 239 mg/dL          High:  > = 240 mg/dL  . Triglycerides 06/13/2018 126.0  0.0 - 149.0 mg/dL Final   Normal:  <150 mg/dLBorderline High:  150 - 199 mg/dL  . HDL 06/13/2018 52.30  >39.00 mg/dL Final  . VLDL 06/13/2018 25.2  0.0 - 40.0 mg/dL Final  . LDL Cholesterol 06/13/2018 148* 0 - 99  mg/dL Final  . Total CHOL/HDL Ratio 06/13/2018 4   Final                  Men          Women1/2 Average Risk     3.4          3.3Average Risk          5.0          4.42X Average Risk          9.6          7.13X Average Risk          15.0          11.0                      . NonHDL 06/13/2018 172.73   Final   NOTE:  Non-HDL goal should be 30 mg/dL higher than patient's LDL goal (i.e. LDL goal of < 70 mg/dL, would have non-HDL goal of < 100 mg/dL)  . TSH 06/13/2018 2.14  0.35 - 4.50 uIU/mL Final  . Free T4  06/13/2018 1.04  0.60 - 1.60 ng/dL Final   Comment: Specimens from patients who are undergoing biotin therapy and /or ingesting biotin supplements may contain high levels of biotin.  The higher biotin concentration in these specimens interferes with this Free T4 assay.  Specimens that contain high levels  of biotin may cause false high results for this Free T4 assay.  Please interpret results in light of the total clinical presentation of the patient.     TPO and ATA antibodies pending. Lipid panel worse.  She just started the keto diet so the LDL increase may not be related to this.Marland KitchenMarland Kitchen I will suggest a statin, if she agrees to do this.  Philemon Kingdom, MD yPhD [facility] Endocrinology

## 2018-06-13 NOTE — Patient Instructions (Addendum)
Please continue Synthroid 125 mcg daily.  Take the thyroid hormone every day, with water, at least 30 minutes before breakfast, separated by at least 4 hours from: - acid reflux medications - calcium - iron - multivitamins  Please stop at the lab.  Please come back for a follow-up appointment in 1 year.  

## 2018-06-16 ENCOUNTER — Encounter: Payer: Self-pay | Admitting: Internal Medicine

## 2018-06-16 LAB — THYROGLOBULIN ANTIBODY: Thyroglobulin Ab: <1 IU/mL (ref <=1)

## 2018-06-16 LAB — THYROID PEROXIDASE ANTIBODY: THYROID PEROXIDASE ANTIBODY: 139 [IU]/mL — AB (ref <9)

## 2018-07-31 ENCOUNTER — Other Ambulatory Visit: Payer: Self-pay | Admitting: Internal Medicine

## 2018-08-20 ENCOUNTER — Other Ambulatory Visit: Payer: Self-pay

## 2018-08-20 ENCOUNTER — Ambulatory Visit (HOSPITAL_BASED_OUTPATIENT_CLINIC_OR_DEPARTMENT_OTHER)
Admission: RE | Admit: 2018-08-20 | Discharge: 2018-08-20 | Disposition: A | Discharge status: Home / Self Care | Payer: 59 | Source: Ambulatory Visit | Attending: Family Medicine | Admitting: Family Medicine

## 2018-08-20 ENCOUNTER — Encounter: Payer: Self-pay | Admitting: Family Medicine

## 2018-08-20 ENCOUNTER — Ambulatory Visit: Payer: 59 | Admitting: Family Medicine

## 2018-08-20 VITALS — BP 121/82 | HR 69 | Temp 97.8°F | Resp 16 | Ht 69.0 in | Wt 226.0 lb

## 2018-08-20 DIAGNOSIS — M79651 Pain in right thigh: Secondary | ICD-10-CM

## 2018-08-20 DIAGNOSIS — M25551 Pain in right hip: Secondary | ICD-10-CM

## 2018-08-20 MED ORDER — CYCLOBENZAPRINE HCL 5 MG PO TABS: 5.0000 mg | ORAL_TABLET | Freq: Two times a day (BID) | ORAL | 0 refills | Status: DC | PRN

## 2018-08-20 NOTE — Patient Instructions (Signed)
Rest, heat, muscle relaxer. NSAIDS Please have xray completed today at [facility] point.  We will call you with result sone available.  This may be from your hip worsening.    Please help [place] help you:  We are honored you have chosen [facility] for your [facility] home. Below you will find basic instructions that you may need to access in the future. Please help [place] help you by reading the instructions, which cover many of the frequent questions we experience.   Prescription refills and request:  -In order to allow more efficient response time, please call your pharmacy for all refills. They will forward the request electronically to [place]. This allows for the quickest possible response. Request left on a nurse line can take longer to refill, since these are checked as time allows between office patients and other phone calls.  - refill request can take up to 3-5 working days to complete.  - If request is sent electronically and request is appropiate, it is usually completed in 1-2 business days.  - all patients will need to be seen routinely for all chronic medical conditions requiring prescription medications (see follow-up below). If you are overdue for follow up on your condition, you will be asked to make an appointment and we will call in enough medication to cover you until your appointment (up to 30 days).  - all controlled substances will require a face to face visit to request/refill.  - if you desire your prescriptions to go through a new pharmacy, and have an active script at original pharmacy, you will need to call your pharmacy and have scripts transferred to new pharmacy. This is completed between the pharmacy locations and not by your provider.    Results: If any images or labs were ordered, it can take up to 1 week to get results depending on the test ordered and the lab/facility running and resulting the test. - Normal or stable results, which do not need further  discussion, may be released to your mychart immediately with attached note to you. A call may not be generated for normal results. Please make certain to sign up for mychart. If you have questions on how to activate your mychart you can call the front office.  - If your results need further discussion, our office will attempt to contact you via phone, and if unable to reach you after 2 attempts, we will release your abnormal result to your mychart with instructions.  - All results will be automatically released in mychart after 1 week.  - Your provider will provide you with explanation and instruction on all relevant material in your results. Please keep in mind, results and labs may appear confusing or abnormal to the untrained eye, but it does not mean they are actually abnormal for you personally. If you have any questions about your results that are not covered, or you desire more detailed explanation than what was provided, you should make an appointment with your provider to do so.   Our office handles many outgoing and incoming calls daily. If we have not contacted you within 1 week about your results, please check your mychart to see if there is a message first and if not, then contact our office.  In helping with this matter, you help decrease call volume, and therefore allow [place] to be able to respond to patients needs more efficiently.   Acute office visits (sick visit):  An acute visit is intended for a  new problem and are scheduled in shorter time slots to allow schedule openings for patients with new problems. This is the appropriate visit to discuss a new problem. Problems will not be addressed by phone call or Echart message. Appointment is needed if requesting treatment. In order to provide you with excellent quality medical care with proper time for you to explain your problem, have an exam and receive treatment with instructions, these appointments should be limited to one new problem per  visit. If you experience a new problem, in which you desire to be addressed, please make an acute office visit, we save openings on the schedule to accommodate you. Please do not save your new problem for any other type of visit, let us take care of it properly and quickly for you.   Follow up visits:  Depending on your condition(s) your provider will need to see you routinely in order to provide you with quality care and prescribe medication(s). Most chronic conditions (Example: hypertension, Diabetes, depression/anxiety... etc), require visits a couple times a year. Your provider will instruct you on proper follow up for your personal medical conditions and history. Please make certain to make follow up appointments for your condition as instructed. Failing to do so could result in lapse in your medication treatment/refills. If you request a refill, and are overdue to be seen on a condition, we will always provide you with a 30 day script (once) to allow you time to schedule.    Medicare wellness (well visit): - we have a wonderful Nurse Maudie Mercury), that will meet with you and provide you will yearly medicare wellness visits. These visits should occur yearly (can not be scheduled less than 1 calendar year apart) and cover preventive health, immunizations, advance directives and screenings you are entitled to yearly through your medicare benefits. Do not miss out on your entitled benefits, this is when medicare will pay for these benefits to be ordered for you.  These are strongly encouraged by your provider and is the appropriate type of visit to make certain you are up to date with all preventive health benefits. If you have not had your medicare wellness exam in the last 12 months, please make certain to schedule one by calling the office and schedule your medicare wellness with Maudie Mercury as soon as possible.   Yearly physical (well visit):  - Adults are recommended to be seen yearly for physicals. Check with  your insurance and date of your last physical, most insurances require one calendar year between physicals. Physicals include all preventive health topics, screenings, medical exam and labs that are appropriate for gender/age and history. You may have fasting labs needed at this visit. This is a well visit (not a sick visit), new problems should not be covered during this visit (see acute visit).  - Pediatric patients are seen more frequently when they are younger. Your provider will advise you on well child visit timing that is appropriate for your their age. - This is not a medicare wellness visit. Medicare wellness exams do not have an exam portion to the visit. Some medicare companies allow for a physical, some do not allow a yearly physical. If your medicare allows a yearly physical you can schedule the medicare wellness with our nurse Maudie Mercury and have your physical with your provider after, on the same day. Please check with insurance for your full benefits.   Late Policy/No Shows:  - all new patients should arrive 15-30 minutes earlier than appointment to allow  [place] time  to  obtain all personal demographics,  insurance information and for you to complete office paperwork. - All established patients should arrive 10-15 minutes earlier than appointment time to update all information and be checked in .  - In our best efforts to run on time, if you are late for your appointment you will be asked to either reschedule or if able, we will work you back into the schedule. There will be a wait time to work you back in the schedule,  depending on availability.  - If you are unable to make it to your appointment as scheduled, please call 24 hours ahead of time to allow [place] to fill the time slot with someone else who needs to be seen. If you do not cancel your appointment ahead of time, you may be charged a no show fee.

## 2018-08-20 NOTE — Progress Notes (Signed)
Allison Compton , 1966/04/15, 53 y.o., female MRN: [id number] Patient Care Team    Relationship Specialty Notifications Start End  Ma Hillock, DO PCP - General Family Medicine  12/22/15   Philemon Kingdom, MD Consulting Physician Internal Medicine  12/23/15    Comment: prediabetes/thyroid    Chief Complaint  Patient presents with  . Muscle Pain    2 week Right leg above the knee, muscles are contracting and will not release. Stops hurting when sitting, but laying/standing/walking is miserable.      Subjective: Pt presents for an OV with complaints of right thigh pain of 2 weeks duration.  Associated symptoms include she reports "excruciating pain "when laying flat and standing straight.  States it is a intense deep ache in the anterior mid thigh.  Reports when she is lays down, will also noticed twitching and mild cramping in her thigh.  She states when she walks she feels like it is "not working well. "Reports the symptoms came on rather suddenly.  She denies any injury or activity prior to onset.  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  She states she had a injection in her hip joint mid November.  He denies any redness, swelling to her thigh.  She denies any recent travel.  She reports when she put a lidocaine patch or heat to the area it does help.   Depression screen [facility] 2/9 06/14/2017 12/22/2015  Decreased Interest 0 0  Down, Depressed, Hopeless 0 0  PHQ - 2 Score 0 0    No Known Allergies Social History   Tobacco Use  . Smoking status: Former Smoker    Last attempt to quit: 08/13/1985    Years since quitting: 33.0  . Smokeless tobacco: Never Used  . Tobacco comment: age [age], up to 1 ppd  Substance Use Topics  . Alcohol use: Yes    Comment: rarely once a month or less   Past Medical History:  Diagnosis Date  . Anal tear   . 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 Eczema   . Edema of both legs   . Fibroids   . Hashimoto's disease   . Hyperlipidemia   . Pre-diabetes    Pt states she is "borderline" for yrs.  . Seasonal affective disorder ([place])    PMH of   Past Surgical History:  Procedure Laterality Date  . ABDOMINAL HYSTERECTOMY  02/10/2014   Dr Gaetano Net  . CERVICAL BIOPSY  W/ LOOP ELECTRODE EXCISION  2002  . cervical dysplasia  2000   LEEP  . LAPAROSCOPIC ASSISTED VAGINAL HYSTERECTOMY N/A 02/10/2014   Procedure: LAPAROSCOPIC ASSISTED VAGINAL HYSTERECTOMY;  Surgeon: Allena Katz, MD;  Location: [facility];  Service: Gynecology;  Laterality: N/A;  . SALPINGOOPHORECTOMY Bilateral 02/10/2014   Procedure: SALPINGO OOPHORECTOMY;  Surgeon: Allena Katz, MD;  Location: [facility];  Service: Gynecology;  Laterality: Bilateral;   Family History  Problem Relation Age of Onset  . Diabetes Mother   . Asthma Mother   . Seasonal affective disorder Mother   . Clotting disorder Mother        excess platelets  . Heart disease Mother   . COPD Mother   . Kidney cancer Father   . Hypertension Father   . Hyperlipidemia Maternal Grandmother   . Stroke Maternal Grandmother 65  mini strokes  . Pancreatic cancer Paternal Grandmother   . Heart failure Paternal Grandmother   . Heart failure Maternal Grandfather        MI @ 4  . Alzheimer's disease Paternal Grandfather   . Seasonal affective disorder Sister   . GI problems Brother        GI bleed  . Heart disease Paternal Uncle    Allergies as of 08/20/2018   No Known Allergies     Medication List       Accurate as of August 20, 2018 10:44 AM. Always use your most recent med list.        FISH OIL + D3 PO Take 1 capsule by mouth daily.   ibuprofen 600 MG tablet Commonly known as:  ADVIL,MOTRIN Take 1 tablet (600 mg total) by mouth every 6 (six) hours as needed (mild pain).   Krill Oil 1000 MG Caps Take 1 capsule by mouth.   magnesium 30 MG tablet Take  60 mg by mouth 2 (two) times daily.   selenium 50 MCG Tabs tablet Take 50 mcg by mouth daily.   SYNTHROID 125 MCG tablet Generic drug:  levothyroxine TAKE 1 TABLET BY MOUTH  DAILY BEFORE BREAKFAST   triamterene 50 MG capsule Commonly known as:  DYRENIUM Take 50 mg by mouth daily.   Zinc 30 MG Caps Take 1 capsule by mouth.       All past medical history, surgical history, allergies, family history, immunizations andmedications were updated in the EMR today and reviewed under the history and medication portions of their EMR.     ROS: Negative, with the exception of above mentioned in HPI   Objective:  BP 121/82   Pulse 69   Temp 97.8 F (36.6 C) (Oral)   Resp 16   Ht 5\' 9"  (1.753 m)   Wt 226 lb (102.5 kg)   LMP 02/06/2012   SpO2 99%   BMI 33.37 kg/m  Body mass index is 33.37 kg/m. Gen: Afebrile. No acute distress. Nontoxic in appearance, well developed, well nourished.  HENT: AT. [place].  MMM Eyes:Pupils Equal Round Reactive to light, Extraocular movements intact,  Conjunctiva without redness, discharge or icterus. MSK: Right hip and thigh-no erythema, no soft tissue swelling.  Mild tenderness to palpation anterior mid thigh.  No masses palpated.  Muscle strength 5/5 right lower extremity except-4/5 hip flexion.  Neurovascularly intact distally. Skin: no rashes, purpura or petechiae.  Neuro: Normal gait. PERLA. EOMi. Alert. Oriented x3   No exam data present No results found. No results found for this or any previous visit (from the past 24 hour(s)).  Assessment/Plan: Allison Compton is a 53 y.o. female present for OV for  Right hip pain/ Right thigh pain -Rest, heat, NSAIDs.  Rule out any hip or femur fracture.  Suspect either a strain from compensation of her hip pain or referred hip pain.  Prescribed Flexeril to help with muscle relaxation, mostly at night may use twice daily as needed if needed. - DG HIP UNILAT WITH PELVIS 2-3 VIEWS RIGHT; Future - DG FEMUR, MIN 2  VIEWS RIGHT; Future -Encouraged to follow-up with her Ortho.  Reviewed expectations re: course of current medical issues.  Discussed self-management of symptoms.  Outlined signs and symptoms indicating need for more acute intervention.  Patient verbalized understanding and all questions were answered.  Patient received an After-Visit Summary.    No orders of the defined types were placed in this encounter.    Note is  dictated utilizing voice recognition software. Although note has been proof read prior to signing, occasional typographical errors still can be missed. If any questions arise, please do not hesitate to call for verification.   electronically signed by:  Howard Pouch, DO  [place]

## 2018-08-21 ENCOUNTER — Encounter: Payer: Self-pay | Admitting: Family Medicine

## 2018-08-21 ENCOUNTER — Telehealth: Payer: Self-pay | Admitting: Family Medicine

## 2018-08-21 NOTE — Telephone Encounter (Signed)
Pt given results per notes of Dr Raoul Pitch on 08/20/18.Unable to document in result note due to result note not being routed to [facility].

## 2018-09-25 NOTE — Patient Instructions (Signed)
PETREA FREDENBURG  09/25/2018   Your procedure is scheduled on: 10-07-2018   Report to [facility] Main  Entrance     Report to admitting at 10:20AM    Call this number if you have problems the morning of surgery [phone]      Remember: Do not eat food or drink liquids :After Midnight. BRUSH YOUR TEETH MORNING OF SURGERY AND RINSE YOUR MOUTH OUT, NO CHEWING GUM CANDY OR MINTS.     Take these medicines the morning of surgery with A SIP OF WATER: SYNTHROID                                You may not have any metal on your body including hair pins and              piercings  Do not wear jewelry, make-up, lotions, powders or perfumes, deodorant             Do not wear nail polish.  Do not shave  48 hours prior to surgery.             Do not bring valuables to the hospital. [place].  Contacts, dentures or bridgework may not be worn into surgery.  Leave suitcase in the car. After surgery it may be brought to your room.                  Please read over the following fact sheets you were given: _____________________________________________________________________             [facility] - Preparing for Surgery Before surgery, you can play an important role.  Because skin is not sterile, your skin needs to be as free of germs as possible.  You can reduce the number of germs on your skin by washing with CHG (chlorahexidine gluconate) soap before surgery.  CHG is an antiseptic cleaner which kills germs and bonds with the skin to continue killing germs even after washing. Please DO NOT use if you have an allergy to CHG or antibacterial soaps.  If your skin becomes reddened/irritated stop using the CHG and inform your nurse when you arrive at Short Stay. Do not shave (including legs and underarms) for at least 48 hours prior to the first CHG shower.  You may shave your face/neck. Please follow these instructions  carefully:  1.  Shower with CHG Soap the night before surgery and the  morning of Surgery.  2.  If you choose to wash your hair, wash your hair first as usual with your  normal  shampoo.  3.  After you shampoo, rinse your hair and body thoroughly to remove the  shampoo.                           4.  Use CHG as you would any other liquid soap.  You can apply chg directly  to the skin and wash                       Gently with a scrungie or clean washcloth.  5.  Apply the CHG Soap to your body ONLY FROM THE NECK DOWN.   Do  not use on face/ open                           Wound or open sores. Avoid contact with eyes, ears mouth and genitals (private parts).                       Wash face,  Genitals (private parts) with your normal soap.             6.  Wash thoroughly, paying special attention to the area where your surgery  will be performed.  7.  Thoroughly rinse your body with warm water from the neck down.  8.  DO NOT shower/wash with your normal soap after using and rinsing off  the CHG Soap.                9.  Pat yourself dry with a clean towel.            10.  Wear clean pajamas.            11.  Place clean sheets on your bed the night of your first shower and do not  sleep with pets. Day of Surgery : Do not apply any lotions/deodorants the morning of surgery.  Please wear clean clothes to the hospital/surgery center.  FAILURE TO FOLLOW THESE INSTRUCTIONS MAY RESULT IN THE CANCELLATION OF YOUR SURGERY PATIENT SIGNATURE_________________________________  NURSE SIGNATURE__________________________________  ________________________________________________________________________   Adam Phenix  An incentive spirometer is a tool that can help keep your lungs clear and active. This tool measures how well you are filling your lungs with each breath. Taking long deep breaths may help reverse or decrease the chance of developing breathing (pulmonary) problems (especially infection)  following:  A long period of time when you are unable to move or be active. BEFORE THE PROCEDURE   If the spirometer includes an indicator to show your best effort, your nurse or respiratory therapist will set it to a desired goal.  If possible, sit up straight or lean slightly forward. Try not to slouch.  Hold the incentive spirometer in an upright position. INSTRUCTIONS FOR USE  1. Sit on the edge of your bed if possible, or sit up as far as you can in bed or on a chair. 2. Hold the incentive spirometer in an upright position. 3. Breathe out normally. 4. Place the mouthpiece in your mouth and seal your lips tightly around it. 5. Breathe in slowly and as deeply as possible, raising the piston or the ball toward the top of the column. 6. Hold your breath for 3-5 seconds or for as long as possible. Allow the piston or ball to fall to the bottom of the column. 7. Remove the mouthpiece from your mouth and breathe out normally. 8. Rest for a few seconds and repeat Steps 1 through 7 at least 10 times every 1-2 hours when you are awake. Take your time and take a few normal breaths between deep breaths. 9. The spirometer may include an indicator to show your best effort. Use the indicator as a goal to work toward during each repetition. 10. After each set of 10 deep breaths, practice coughing to be sure your lungs are clear. If you have an incision (the cut made at the time of surgery), support your incision when coughing by placing a pillow or rolled up towels firmly against it. Once you are able to get out of bed,  walk around indoors and cough well. You may stop using the incentive spirometer when instructed by your caregiver.  RISKS AND COMPLICATIONS  Take your time so you do not get dizzy or light-headed.  If you are in pain, you may need to take or ask for pain medication before doing incentive spirometry. It is harder to take a deep breath if you are having pain. AFTER USE  Rest and  breathe slowly and easily.  It can be helpful to keep track of a log of your progress. Your caregiver can provide you with a simple table to help with this. If you are using the spirometer at home, follow these instructions: [place] IF:   You are having difficultly using the spirometer.  You have trouble using the spirometer as often as instructed.  Your pain medication is not giving enough relief while using the spirometer.  You develop fever of 100.5 F (38.1 C) or higher. SEEK IMMEDIATE MEDICAL CARE IF:   You cough up bloody sputum that had not been present before.  You develop fever of 102 F (38.9 C) or greater.  You develop worsening pain at or near the incision site. MAKE SURE YOU:   Understand these instructions.  Will watch your condition.  Will get help right away if you are not doing well or get worse. Document Released: 12/10/2006 Document Revised: 10/22/2011 Document Reviewed: 02/10/2007 ExitCare Patient Information 2014 Exit[employer], [place].   ________________________________________________________________________  WHAT IS A BLOOD TRANSFUSION? Blood Transfusion Information  A transfusion is the replacement of blood or some of its parts. Blood is made up of multiple cells which provide different functions.  Red blood cells carry oxygen and are used for blood loss replacement.  White blood cells fight against infection.  Platelets control bleeding.  Plasma helps clot blood.  Other blood products are available for specialized needs, such as hemophilia or other clotting disorders. BEFORE THE TRANSFUSION  Who gives blood for transfusions?   Healthy volunteers who are fully evaluated to make sure their blood is safe. This is blood bank blood. Transfusion therapy is the safest it has ever been in the practice of medicine. Before blood is taken from a donor, a complete history is taken to make sure that person has no history of diseases nor engages in  risky social behavior (examples are intravenous drug use or sexual activity with multiple partners). The donor's travel history is screened to minimize risk of transmitting infections, such as malaria. The donated blood is tested for signs of infectious diseases, such as HIV and hepatitis. The blood is then tested to be sure it is compatible with you in order to minimize the chance of a transfusion reaction. If you or a relative donates blood, this is often done in anticipation of surgery and is not appropriate for emergency situations. It takes many days to process the donated blood. RISKS AND COMPLICATIONS Although transfusion therapy is very safe and saves many lives, the main dangers of transfusion include:   Getting an infectious disease.  Developing a transfusion reaction. This is an allergic reaction to something in the blood you were given. Every precaution is taken to prevent this. The decision to have a blood transfusion has been considered carefully by your caregiver before blood is given. Blood is not given unless the benefits outweigh the risks. AFTER THE TRANSFUSION  Right after receiving a blood transfusion, you will usually feel much better and more energetic. This is especially true if your red blood cells  have gotten low (anemic). The transfusion raises the level of the red blood cells which carry oxygen, and this usually causes an energy increase.  The nurse administering the transfusion will monitor you carefully for complications. HOME CARE INSTRUCTIONS  No special instructions are needed after a transfusion. You may find your energy is better. Speak with your caregiver about any limitations on activity for underlying diseases you may have. SEEK MEDICAL CARE IF:   Your condition is not improving after your transfusion.  You develop redness or irritation at the intravenous (IV) site. SEEK IMMEDIATE MEDICAL CARE IF:  Any of the following symptoms occur over the next 12  hours:  Shaking chills.  You have a temperature by mouth above 102 F (38.9 C), not controlled by medicine.  Chest, back, or muscle pain.  People around you feel you are not acting correctly or are confused.  Shortness of breath or difficulty breathing.  Dizziness and fainting.  You get a rash or develop hives.  You have a decrease in urine output.  Your urine turns a dark color or changes to pink, red, or brown. Any of the following symptoms occur over the next 10 days:  You have a temperature by mouth above 102 F (38.9 C), not controlled by medicine.  Shortness of breath.  Weakness after normal activity.  The white part of the eye turns yellow (jaundice).  You have a decrease in the amount of urine or are urinating less often.  Your urine turns a dark color or changes to pink, red, or brown. Document Released: 07/27/2000 Document Revised: 10/22/2011 Document Reviewed: 03/15/2008 [facility] Patient Information 2014 [place], [place].  _______________________________________________________________________

## 2018-09-26 ENCOUNTER — Encounter (HOSPITAL_COMMUNITY): Payer: Self-pay

## 2018-09-26 ENCOUNTER — Other Ambulatory Visit: Payer: Self-pay

## 2018-09-26 ENCOUNTER — Encounter (HOSPITAL_COMMUNITY)
Admission: RE | Admit: 2018-09-26 | Discharge: 2018-09-26 | Disposition: A | Discharge status: Home / Self Care | Payer: 59 | Source: Ambulatory Visit

## 2018-09-26 ENCOUNTER — Encounter (HOSPITAL_COMMUNITY)
Admission: RE | Admit: 2018-09-26 | Discharge: 2018-09-26 | Disposition: A | Discharge status: Home / Self Care | Payer: 59 | Source: Ambulatory Visit | Attending: Orthopedic Surgery | Admitting: Orthopedic Surgery

## 2018-09-26 DIAGNOSIS — Z01812 Encounter for preprocedural laboratory examination: Secondary | ICD-10-CM | POA: Diagnosis not present

## 2018-09-26 HISTORY — DX: Elevated blood-pressure reading, without diagnosis of hypertension: R03.0

## 2018-09-26 LAB — TYPE AND SCREEN
ABO/RH(D): O NEG
Antibody Screen: NEGATIVE

## 2018-09-26 LAB — CBC
HCT: 42.4 % (ref 36.0–46.0)
Hemoglobin: 13.6 g/dL (ref 12.0–15.0)
MCH: 28 pg (ref 26.0–34.0)
MCHC: 32.1 g/dL (ref 30.0–36.0)
MCV: 87.4 fL (ref 80.0–100.0)
NRBC: 0 % (ref 0.0–0.2)
Platelets: 246 10*3/uL (ref 150–400)
RBC: 4.85 MIL/uL (ref 3.87–5.11)
RDW: 13.3 % (ref 11.5–15.5)
WBC: 7.6 10*3/uL (ref 4.0–10.5)

## 2018-09-26 LAB — BASIC METABOLIC PANEL
Anion gap: 10 (ref 5–15)
BUN: 17 mg/dL (ref 6–20)
CHLORIDE: 105 mmol/L (ref 98–111)
CO2: 26 mmol/L (ref 22–32)
Calcium: 9.5 mg/dL (ref 8.9–10.3)
Creatinine, Ser: 0.77 mg/dL (ref 0.44–1.00)
GFR calc Af Amer: >60 mL/min (ref >60)
GFR calc non Af Amer: >60 mL/min (ref >60)
Glucose, Bld: 108 mg/dL — ABNORMAL HIGH (ref 70–99)
Potassium: 4.1 mmol/L (ref 3.5–5.1)
SODIUM: 141 mmol/L (ref 135–145)

## 2018-09-26 LAB — SURGICAL PCR SCREEN
MRSA, PCR: NEGATIVE
Staphylococcus aureus: NEGATIVE

## 2018-09-26 LAB — HEMOGLOBIN A1C
Hgb A1c MFr Bld: 6 % — ABNORMAL HIGH (ref 4.8–5.6)
Mean Plasma Glucose: 125.5 mg/dL

## 2018-09-26 NOTE — H&P (Signed)
TOTAL HIP ADMISSION H&P  Patient is admitted for right total hip arthroplasty, anterior approach.  Subjective:  Chief Complaint: Right hip primary OA / pain  HPI: Allison Compton, 53 y.o. female, has a history of pain and functional disability in the right hip(s) due to arthritis and patient has failed non-surgical conservative treatments for greater than 12 weeks to include NSAID's and/or analgesics, corticosteriod injections, supervised PT with diminished ADL's post treatment and activity modification.  Onset of symptoms was gradual starting 2.5 years ago with gradually worsening course since that time, significantly increased since Semptember.The patient noted no past surgery on the right hip(s).  Patient currently rates pain in the right hip at 9 out of 10 with activity. Patient has night pain, worsening of pain with activity and weight bearing, trendelenberg gait, pain that interfers with activities of daily living and pain with passive range of motion. Patient has evidence of periarticular osteophytes and joint space narrowing by imaging studies. This condition presents safety issues increasing the risk of falls.  There is no current active infection.  Risks, benefits and expectations were discussed with the patient.  Risks including but not limited to the risk of anesthesia, blood clots, nerve damage, blood vessel damage, failure of the prosthesis, infection and up to and including death.  Patient understand the risks, benefits and expectations and wishes to proceed with surgery.   PCP: Ma Hillock, DO  D/C Plans:       Home  Post-op Meds:       No Rx given   Tranexamic Acid:      To be given - IV   Decadron:      Is to be given  FYI:     ASA  Norco  DME:   Rx given for - RW &  3-n-1  PT:   No PT    Patient Active Problem List   Diagnosis Date Noted  . 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 Irritable 05/17/2017  . Hot flashes 05/17/2017  . Rectal bleeding 05/17/2017  . Bilateral leg  weakness 05/17/2017  . Palpitations 05/09/2016  . Fatigue 04/09/2016  . Bilateral lower extremity edema 12/26/2015  . Weight gain 12/26/2015  . Obesity, Class II, BMI 35-39.9, with comorbidity 12/26/2015  . Prediabetes 10/03/2015  . Hypothyroidism 04/19/2015  . HYPERLIPIDEMIA 06/10/2007  . 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 [place] DISEASE 06/09/2007   Past Medical History:  Diagnosis Date  . Anal tear   . 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 Eczema   . Edema of both legs   . Fibroids   . Hashimoto's disease   . Hyperlipidemia   . Pre-diabetes    Pt states she is "borderline" for yrs.  . Seasonal affective disorder ([place])    PMH of    Past Surgical History:  Procedure Laterality Date  . CERVICAL BIOPSY  W/ LOOP ELECTRODE EXCISION  2002  . LAPAROSCOPIC ASSISTED VAGINAL HYSTERECTOMY N/A 02/10/2014   Procedure: LAPAROSCOPIC ASSISTED VAGINAL HYSTERECTOMY;  Surgeon: Allena Katz, MD;  Location: [facility];  Service: Gynecology;  Laterality: N/A;  . SALPINGOOPHORECTOMY Bilateral 02/10/2014   Procedure: SALPINGO OOPHORECTOMY;  Surgeon: Allena Katz, MD;  Location: [facility];  Service: Gynecology;  Laterality: Bilateral;    No current facility-administered medications for this encounter.    Current Outpatient Medications  Medication Sig Dispense Refill Last Dose  . ibuprofen (ADVIL,MOTRIN) 200 MG tablet Take 400 mg by mouth every 8 (  eight) hours as needed for moderate pain.     Marland Kitchen SYNTHROID 125 MCG tablet TAKE 1 TABLET BY MOUTH  DAILY BEFORE BREAKFAST (Patient taking differently: Take 125 mcg by mouth daily before breakfast. ) 90 tablet 1 Taking  . cyclobenzaprine (FLEXERIL) 5 MG tablet Take 1 tablet (5 mg total) by mouth 2 (two) times daily as needed for muscle spasms. (Patient not taking: Reported on 09/25/2018) 60 tablet 0 Not Taking at Unknown time  . ibuprofen (ADVIL,MOTRIN) 600 MG tablet Take 1 tablet (600 mg total) by mouth every 6 (six) hours as  needed (mild pain). (Patient not taking: Reported on 09/25/2018) 30 tablet 0 Not Taking at Unknown time   Allergies  Allergen Reactions  . Adhesive [Tape] Rash    Sometimes peels skin off  . Latex Rash    Sometimes peels skin off    Social History   Tobacco Use  . Smoking status: Former Smoker    Last attempt to quit: 08/13/1985    Years since quitting: 33.1  . Smokeless tobacco: Never Used  . Tobacco comment: age [age], up to 1 ppd  Substance Use Topics  . Alcohol use: Yes    Comment: rarely once a month or less    Family History  Problem Relation Age of Onset  . Diabetes Mother   . Asthma Mother   . Seasonal affective disorder Mother   . Clotting disorder Mother        excess platelets  . Heart disease Mother   . COPD Mother   . Kidney cancer Father   . Hypertension Father   . Hyperlipidemia Maternal Grandmother   . Stroke Maternal Grandmother 75       mini strokes  . Pancreatic cancer Paternal Grandmother   . Heart failure Paternal Grandmother   . Heart failure Maternal Grandfather        MI @ 74  . Alzheimer's disease Paternal Grandfather   . Seasonal affective disorder Sister   . GI problems Brother        GI bleed  . Heart disease Paternal Uncle      Review of Systems  Constitutional: Negative.   HENT: Negative.   Eyes: Negative.   Respiratory: Negative.   Cardiovascular: Negative.   Gastrointestinal: Negative.   Genitourinary: Negative.   Musculoskeletal: Positive for joint pain.  Skin: Negative.   Neurological: Negative.   Endo/Heme/Allergies: Negative.   Psychiatric/Behavioral: Negative.     Objective:  Physical Exam  Constitutional: She is oriented to person, place, and time. She appears well-developed.  HENT:  Head: Normocephalic.  Eyes: Pupils are equal, round, and reactive to light.  Neck: Neck supple. No JVD present. No tracheal deviation present. No thyromegaly present.  Cardiovascular: Normal rate, regular rhythm and intact distal  pulses.  Respiratory: Effort normal and breath sounds normal. No respiratory distress. She has no wheezes.  GI: Soft. There is no abdominal tenderness. There is no guarding.  Musculoskeletal:     Right hip: She exhibits decreased range of motion, decreased strength, tenderness and bony tenderness. She exhibits no swelling, no deformity and no laceration.  Lymphadenopathy:    She has no cervical adenopathy.  Neurological: She is alert and oriented to person, place, and time. A sensory deficit (rare numbness in the feet/toes) is present.  Skin: Skin is warm and dry.  Psychiatric: She has a normal mood and affect.     Labs:  Estimated body mass index is 33.37 kg/m as calculated from the following:  Height as of 08/20/18: 5\' 9"  (1.753 m).   Weight as of 08/20/18: 102.5 kg.   Imaging Review Plain radiographs demonstrate severe degenerative joint disease of the right hip. The bone quality appears to be good for age and reported activity level.      Assessment/Plan:  End stage arthritis, right hip  The patient history, physical examination, clinical judgement of the provider and imaging studies are consistent with end stage degenerative joint disease of the right hip and total hip arthroplasty is deemed medically necessary. The treatment options including medical management, injection therapy, arthroscopy and arthroplasty were discussed at length. The risks and benefits of total hip arthroplasty were presented and reviewed. The risks due to aseptic loosening, infection, stiffness, dislocation/subluxation,  thromboembolic complications and other imponderables were discussed.  The patient acknowledged the explanation, agreed to proceed with the plan and consent was signed. Patient is being admitted for inpatient treatment for surgery, pain control, PT, OT, prophylactic antibiotics, VTE prophylaxis, progressive ambulation and ADL's and discharge planning.The patient is planning to be discharged  home with home health services    Patient's anticipated LOS is less than 2 midnights, meeting these requirements: - Younger than 37 - Lives within 1 hour of care - Has a competent adult at home to recover with post-op recover - NO history of  - Chronic pain requiring opiods  - Diabetes  - Coronary Artery Disease  - Heart failure  - Heart attack  - Stroke  - DVT/VTE  - Cardiac arrhythmia  - Respiratory Failure/COPD  - Renal failure  - Anemia  - Advanced Liver disease     West Pugh. Chasitee Zenker   PA-C  09/26/2018, 1:39 PM

## 2018-09-26 NOTE — Patient Instructions (Signed)
Allison Compton  09/26/2018   Your procedure is scheduled on: 10-07-2018   Report to [facility] Main  Entrance     Report to admitting at 10:20AM    Call this number if you have problems the morning of surgery [phone]      Remember: Do not eat food or drink liquids :After Midnight. BRUSH YOUR TEETH MORNING OF SURGERY AND RINSE YOUR MOUTH OUT, NO CHEWING GUM CANDY OR MINTS.     Take these medicines the morning of surgery with A SIP OF WATER: SYNTHROID                                 You may not have any metal on your body including hair pins and              piercings  Do not wear jewelry, make-up, lotions, powders or perfumes, deodorant             Do not wear nail polish.  Do not shave  48 hours prior to surgery.                 Do not bring valuables to the hospital. [place].  Contacts, dentures or bridgework may not be worn into surgery.  Leave suitcase in the car. After surgery it may be brought to your room.                  Please read over the following fact sheets you were given: _____________________________________________________________________             [facility] - Preparing for Surgery Before surgery, you can play an important role.  Because skin is not sterile, your skin needs to be as free of germs as possible.  You can reduce the number of germs on your skin by washing with CHG (chlorahexidine gluconate) soap before surgery.  CHG is an antiseptic cleaner which kills germs and bonds with the skin to continue killing germs even after washing. Please DO NOT use if you have an allergy to CHG or antibacterial soaps.  If your skin becomes reddened/irritated stop using the CHG and inform your nurse when you arrive at Short Stay. Do not shave (including legs and underarms) for at least 48 hours prior to the first CHG shower.  You may shave your face/neck. Please follow these  instructions carefully:  1.  Shower with CHG Soap the night before surgery and the  morning of Surgery.  2.  If you choose to wash your hair, wash your hair first as usual with your  normal  shampoo.  3.  After you shampoo, rinse your hair and body thoroughly to remove the  shampoo.                           4.  Use CHG as you would any other liquid soap.  You can apply chg directly  to the skin and wash                       Gently with a scrungie or clean washcloth.  5.  Apply the CHG Soap to your body ONLY FROM THE  NECK DOWN.   Do not use on face/ open                           Wound or open sores. Avoid contact with eyes, ears mouth and genitals (private parts).                       Wash face,  Genitals (private parts) with your normal soap.             6.  Wash thoroughly, paying special attention to the area where your surgery  will be performed.  7.  Thoroughly rinse your body with warm water from the neck down.  8.  DO NOT shower/wash with your normal soap after using and rinsing off  the CHG Soap.                9.  Pat yourself dry with a clean towel.            10.  Wear clean pajamas.            11.  Place clean sheets on your bed the night of your first shower and do not  sleep with pets. Day of Surgery : Do not apply any lotions/deodorants the morning of surgery.  Please wear clean clothes to the hospital/surgery center.  FAILURE TO FOLLOW THESE INSTRUCTIONS MAY RESULT IN THE CANCELLATION OF YOUR SURGERY PATIENT SIGNATURE_________________________________  NURSE SIGNATURE__________________________________  ________________________________________________________________________     Adam Phenix  An incentive spirometer is a tool that can help keep your lungs clear and active. This tool measures how well you are filling your lungs with each breath. Taking long deep breaths may help reverse or decrease the chance of developing breathing (pulmonary) problems  (especially infection) following:  A long period of time when you are unable to move or be active. BEFORE THE PROCEDURE   If the spirometer includes an indicator to show your best effort, your nurse or respiratory therapist will set it to a desired goal.  If possible, sit up straight or lean slightly forward. Try not to slouch.  Hold the incentive spirometer in an upright position. INSTRUCTIONS FOR USE  1. Sit on the edge of your bed if possible, or sit up as far as you can in bed or on a chair. 2. Hold the incentive spirometer in an upright position. 3. Breathe out normally. 4. Place the mouthpiece in your mouth and seal your lips tightly around it. 5. Breathe in slowly and as deeply as possible, raising the piston or the ball toward the top of the column. 6. Hold your breath for 3-5 seconds or for as long as possible. Allow the piston or ball to fall to the bottom of the column. 7. Remove the mouthpiece from your mouth and breathe out normally. 8. Rest for a few seconds and repeat Steps 1 through 7 at least 10 times every 1-2 hours when you are awake. Take your time and take a few normal breaths between deep breaths. 9. The spirometer may include an indicator to show your best effort. Use the indicator as a goal to work toward during each repetition. 10. After each set of 10 deep breaths, practice coughing to be sure your lungs are clear. If you have an incision (the cut made at the time of surgery), support your incision when coughing by placing a pillow or rolled up towels firmly against it. Once you  are able to get out of bed, walk around indoors and cough well. You may stop using the incentive spirometer when instructed by your caregiver.  RISKS AND COMPLICATIONS  Take your time so you do not get dizzy or light-headed.  If you are in pain, you may need to take or ask for pain medication before doing incentive spirometry. It is harder to take a deep breath if you are having  pain. AFTER USE  Rest and breathe slowly and easily.  It can be helpful to keep track of a log of your progress. Your caregiver can provide you with a simple table to help with this. If you are using the spirometer at home, follow these instructions: [place] IF:   You are having difficultly using the spirometer.  You have trouble using the spirometer as often as instructed.  Your pain medication is not giving enough relief while using the spirometer.  You develop fever of 100.5 F (38.1 C) or higher. SEEK IMMEDIATE MEDICAL CARE IF:   You cough up bloody sputum that had not been present before.  You develop fever of 102 F (38.9 C) or greater.  You develop worsening pain at or near the incision site. MAKE SURE YOU:   Understand these instructions.  Will watch your condition.  Will get help right away if you are not doing well or get worse. Document Released: 12/10/2006 Document Revised: 10/22/2011 Document Reviewed: 02/10/2007 ExitCare Patient Information 2014 Exit[employer], [place].   ________________________________________________________________________  WHAT IS A BLOOD TRANSFUSION? Blood Transfusion Information  A transfusion is the replacement of blood or some of its parts. Blood is made up of multiple cells which provide different functions.  Red blood cells carry oxygen and are used for blood loss replacement.  White blood cells fight against infection.  Platelets control bleeding.  Plasma helps clot blood.  Other blood products are available for specialized needs, such as hemophilia or other clotting disorders. BEFORE THE TRANSFUSION  Who gives blood for transfusions?   Healthy volunteers who are fully evaluated to make sure their blood is safe. This is blood bank blood. Transfusion therapy is the safest it has ever been in the practice of medicine. Before blood is taken from a donor, a complete history is taken to make sure that person has no history  of diseases nor engages in risky social behavior (examples are intravenous drug use or sexual activity with multiple partners). The donor's travel history is screened to minimize risk of transmitting infections, such as malaria. The donated blood is tested for signs of infectious diseases, such as HIV and hepatitis. The blood is then tested to be sure it is compatible with you in order to minimize the chance of a transfusion reaction. If you or a relative donates blood, this is often done in anticipation of surgery and is not appropriate for emergency situations. It takes many days to process the donated blood. RISKS AND COMPLICATIONS Although transfusion therapy is very safe and saves many lives, the main dangers of transfusion include:   Getting an infectious disease.  Developing a transfusion reaction. This is an allergic reaction to something in the blood you were given. Every precaution is taken to prevent this. The decision to have a blood transfusion has been considered carefully by your caregiver before blood is given. Blood is not given unless the benefits outweigh the risks. AFTER THE TRANSFUSION  Right after receiving a blood transfusion, you will usually feel much better and more energetic. This is  especially true if your red blood cells have gotten low (anemic). The transfusion raises the level of the red blood cells which carry oxygen, and this usually causes an energy increase.  The nurse administering the transfusion will monitor you carefully for complications. HOME CARE INSTRUCTIONS  No special instructions are needed after a transfusion. You may find your energy is better. Speak with your caregiver about any limitations on activity for underlying diseases you may have. SEEK MEDICAL CARE IF:   Your condition is not improving after your transfusion.  You develop redness or irritation at the intravenous (IV) site. SEEK IMMEDIATE MEDICAL CARE IF:  Any of the following symptoms  occur over the next 12 hours:  Shaking chills.  You have a temperature by mouth above 102 F (38.9 C), not controlled by medicine.  Chest, back, or muscle pain.  People around you feel you are not acting correctly or are confused.  Shortness of breath or difficulty breathing.  Dizziness and fainting.  You get a rash or develop hives.  You have a decrease in urine output.  Your urine turns a dark color or changes to pink, red, or brown. Any of the following symptoms occur over the next 10 days:  You have a temperature by mouth above 102 F (38.9 C), not controlled by medicine.  Shortness of breath.  Weakness after normal activity.  The white part of the eye turns yellow (jaundice).  You have a decrease in the amount of urine or are urinating less often.  Your urine turns a dark color or changes to pink, red, or brown. Document Released: 07/27/2000 Document Revised: 10/22/2011 Document Reviewed: 03/15/2008 [facility] Patient Information 2014 [place], [place].  _______________________________________________________________________

## 2018-09-27 LAB — ABO/RH: ABO/RH(D): O NEG

## 2018-10-02 NOTE — Progress Notes (Signed)
Anesthesia Chart Review   Case:  [id number] Date/Time:  10/07/18 1235   Procedure:  TOTAL HIP ARTHROPLASTY ANTERIOR APPROACH (Right ) - 70 mins   Anesthesia type:  Spinal   Pre-op diagnosis:  Right hip osteoarthritis   Location:  [place] [facility] / [facility]   Surgeon:  Paralee Cancel, MD      DISCUSSION: 53 yo former smoker (quit 08/13/85) with h/o HLD, Hashimoto's, pre-diabetic, right hip OA scheduled for above procedure 10/07/18 with Dr. Paralee Cancel.   Previously seen by cardiology for palpitations, seen by Dr. Sanda Klein 05/10/16.  Negative cardiac workup, advised to follow up prn.   She is followed by endocrinologist Dr. Philemon Kingdom and was last seen 06/13/18, stable at this visit.  Last seen by PCP Dr. Howard Pouch 08/20/18.    Pt can proceed with planned procedure barring acute status change.  VS: BP 122/73 Comment: PER KAY S.   Pulse 81   Temp 36.6 C (Oral)   Resp 18   Ht 5\' 9"  (1.753 m)   Wt 101.2 kg   LMP 02/06/2012   SpO2 100%   BMI 32.96 kg/m   PROVIDERS: Ma Hillock, DO is PCP   Croitoru, Mihai, MD is Cardiologist   Philemon Kingdom, MD is Endocrinologist  LABS: Labs reviewed: Acceptable for surgery. (all labs ordered are listed, but only abnormal results are displayed)  Labs Reviewed  HEMOGLOBIN A1C - Abnormal; Notable for the following components:      Result Value   Hgb A1c MFr Bld 6.0 (*)    All other components within normal limits  BASIC METABOLIC PANEL - Abnormal; Notable for the following components:   Glucose, Bld 108 (*)    All other components within normal limits  SURGICAL PCR SCREEN  CBC  TYPE AND SCREEN  ABO/RH     IMAGES:   EKG: 05/09/16 Rate 65 bpm Normal sinus rhythm  Abnormal QRS-T angle, consider primary T wave abnormality   CV: Echo 06/06/16 Study Conclusions  - Left ventricle: The cavity size was normal. Systolic function was   normal. The estimated ejection fraction was in the range of 60%   to 65%. Wall motion was  normal; there were no regional wall   motion abnormalities. Left ventricular diastolic function   parameters were normal. - Left atrium: The atrium was mildly dilated. - Atrial septum: No defect or patent foramen ovale was identified.  Stress Test 06/06/16  Blood pressure demonstrated a hypertensive response to exercise.  There was 85mm of J point depression with horizontal to upsloping ST segment depression in the inferolateral leads at peak exercise that resolved immediately in recovery  Difficult interpretation due to significant wandering baseline artifact but overall study appears low risk.  Mildly reduced exercise capacity. The patient reached 7 mets. Past Medical History:  Diagnosis Date  . Anal tear   . 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 Eczema   . Edema of both legs   . Elevated blood pressure reading   . Fibroids   . Hashimoto's disease   . Hyperlipidemia   . Pre-diabetes    Pt states she is "borderline" for yrs.  . Seasonal affective disorder ([place])    PMH of    Past Surgical History:  Procedure Laterality Date  . ANAL FISTULOTOMY  2019  . CERVICAL BIOPSY  W/ LOOP ELECTRODE EXCISION  2002  . LAPAROSCOPIC ASSISTED VAGINAL HYSTERECTOMY N/A 02/10/2014   Procedure: LAPAROSCOPIC ASSISTED VAGINAL  HYSTERECTOMY;  Surgeon: Allena Katz, MD;  Location: [facility];  Service: Gynecology;  Laterality: N/A;  . SALPINGOOPHORECTOMY Bilateral 02/10/2014   Procedure: SALPINGO OOPHORECTOMY;  Surgeon: Allena Katz, MD;  Location: [facility];  Service: Gynecology;  Laterality: Bilateral;    MEDICATIONS: . cyclobenzaprine (FLEXERIL) 5 MG tablet  . ibuprofen (ADVIL,MOTRIN) 200 MG tablet  . ibuprofen (ADVIL,MOTRIN) 600 MG tablet  . SYNTHROID 125 MCG tablet   No current facility-administered medications for this encounter.     Maia Plan [facility] Pre-Surgical Testing [phone] 10/02/18 2:12 PM

## 2018-10-02 NOTE — Anesthesia Preprocedure Evaluation (Addendum)
Anesthesia Evaluation  Patient identified by MRN, date of birth, ID band Patient awake    Reviewed: Allergy & Precautions, NPO status , Patient's Chart, lab work & pertinent test results  History of Anesthesia Complications Negative for: history of anesthetic complications  Airway Mallampati: II  TM Distance: >3 FB Neck ROM: Full    Dental  (+) Dental Advisory Given   Pulmonary former smoker,    breath sounds clear to auscultation       Cardiovascular negative cardio ROS   Rhythm:Regular     Neuro/Psych PSYCHIATRIC DISORDERS Depression negative neurological ROS     GI/Hepatic negative GI ROS, Neg liver ROS,   Endo/Other  Hypothyroidism Morbid obesity  Renal/GU negative Renal ROS     Musculoskeletal  (+) Arthritis ,   Abdominal   Peds  Hematology negative hematology ROS (+)   Anesthesia Other Findings Left ventricle: The cavity size was normal. Systolic function was   normal. The estimated ejection fraction was in the range of 60%   to 65%. Wall motion was normal; there were no regional wall   motion abnormalities. Left ventricular diastolic function   parameters were normal. - Left atrium: The atrium was mildly dilated. - Atrial septum: No defect or patent foramen ovale was identified.   Blood pressure demonstrated a hypertensive response to exercise.  There was 55m of J point depression with horizontal to upsloping ST segment depression in the inferolateral leads at peak exercise that resolved immediately in recovery  Difficult interpretation due to significant wandering baseline artifact but overall study appears low risk.  Mildly reduced exercise capacity. The patient reached 7 mets.  Reproductive/Obstetrics                           Anesthesia Physical Anesthesia Plan  ASA: II  Anesthesia Plan: MAC and Spinal   Post-op Pain Management:    Induction:   PONV Risk Score  and Plan: 2 and Treatment may vary due to age or medical condition and Propofol infusion  Airway Management Planned: Nasal Cannula  Additional Equipment: None  Intra-op Plan:   Post-operative Plan:   Informed Consent: I have reviewed the patients History and Physical, chart, labs and discussed the procedure including the risks, benefits and alternatives for the proposed anesthesia with the patient or authorized representative who has indicated his/her understanding and acceptance.     Dental advisory given  Plan Discussed with: CRNA and Surgeon  Anesthesia Plan Comments: (See PAT note 09/26/18, JKonrad Felix PA-C)       Anesthesia Quick Evaluation

## 2018-10-07 ENCOUNTER — Inpatient Hospital Stay (HOSPITAL_COMMUNITY): Payer: 59

## 2018-10-07 ENCOUNTER — Other Ambulatory Visit: Payer: Self-pay

## 2018-10-07 ENCOUNTER — Inpatient Hospital Stay (HOSPITAL_COMMUNITY)
Admission: RE | Admit: 2018-10-07 | Discharge: 2018-10-09 | DRG: 470 | Disposition: A | Discharge status: Home Health | Payer: 59 | Source: Ambulatory Visit | Attending: Orthopedic Surgery | Admitting: Orthopedic Surgery

## 2018-10-07 ENCOUNTER — Inpatient Hospital Stay (HOSPITAL_COMMUNITY): Payer: 59 | Admitting: Physician Assistant

## 2018-10-07 ENCOUNTER — Encounter (HOSPITAL_COMMUNITY): Payer: Self-pay | Admitting: Emergency Medicine

## 2018-10-07 ENCOUNTER — Encounter (HOSPITAL_COMMUNITY)
Admission: RE | Disposition: A | Discharge status: Home Health | Payer: Self-pay | Source: Ambulatory Visit | Attending: Orthopedic Surgery

## 2018-10-07 ENCOUNTER — Inpatient Hospital Stay (HOSPITAL_COMMUNITY): Payer: 59 | Admitting: Certified Registered Nurse Anesthetist

## 2018-10-07 DIAGNOSIS — Z91040 Latex allergy status: Secondary | ICD-10-CM | POA: Diagnosis not present

## 2018-10-07 DIAGNOSIS — Z419 Encounter for procedure for purposes other than remedying health state, unspecified: Secondary | ICD-10-CM

## 2018-10-07 DIAGNOSIS — Z87891 Personal history of nicotine dependence: Secondary | ICD-10-CM | POA: Diagnosis not present

## 2018-10-07 DIAGNOSIS — Z96641 Presence of right artificial hip joint: Secondary | ICD-10-CM

## 2018-10-07 DIAGNOSIS — Z832 Family history of diseases of the blood and blood-forming organs and certain disorders involving the immune mechanism: Secondary | ICD-10-CM

## 2018-10-07 DIAGNOSIS — Z825 Family history of asthma and other chronic lower respiratory diseases: Secondary | ICD-10-CM | POA: Diagnosis not present

## 2018-10-07 DIAGNOSIS — Z820 Family history of epilepsy and other diseases of the nervous system: Secondary | ICD-10-CM

## 2018-10-07 DIAGNOSIS — R7303 Prediabetes: Secondary | ICD-10-CM | POA: Diagnosis present

## 2018-10-07 DIAGNOSIS — M1611 Unilateral primary osteoarthritis, right hip: Principal | ICD-10-CM | POA: Diagnosis present

## 2018-10-07 DIAGNOSIS — Z823 Family history of stroke: Secondary | ICD-10-CM

## 2018-10-07 DIAGNOSIS — Z800 Family history of malignant neoplasm of digestive organs: Secondary | ICD-10-CM | POA: Diagnosis not present

## 2018-10-07 DIAGNOSIS — E039 Hypothyroidism, unspecified: Secondary | ICD-10-CM | POA: Diagnosis present

## 2018-10-07 DIAGNOSIS — Z8249 Family history of ischemic heart disease and other diseases of the circulatory system: Secondary | ICD-10-CM | POA: Diagnosis not present

## 2018-10-07 DIAGNOSIS — Z96649 Presence of unspecified artificial hip joint: Secondary | ICD-10-CM | POA: Insufficient documentation

## 2018-10-07 DIAGNOSIS — M25551 Pain in right hip: Secondary | ICD-10-CM | POA: Diagnosis present

## 2018-10-07 DIAGNOSIS — Z6832 Body mass index (BMI) 32.0-32.9, adult: Secondary | ICD-10-CM | POA: Diagnosis not present

## 2018-10-07 DIAGNOSIS — E785 Hyperlipidemia, unspecified: Secondary | ICD-10-CM | POA: Diagnosis present

## 2018-10-07 DIAGNOSIS — Z833 Family history of diabetes mellitus: Secondary | ICD-10-CM

## 2018-10-07 DIAGNOSIS — Z8349 Family history of other endocrine, nutritional and metabolic diseases: Secondary | ICD-10-CM | POA: Diagnosis not present

## 2018-10-07 DIAGNOSIS — Z91048 Other nonmedicinal substance allergy status: Secondary | ICD-10-CM | POA: Diagnosis not present

## 2018-10-07 DIAGNOSIS — Z79890 Hormone replacement therapy (postmenopausal): Secondary | ICD-10-CM | POA: Diagnosis not present

## 2018-10-07 DIAGNOSIS — Z8051 Family history of malignant neoplasm of kidney: Secondary | ICD-10-CM

## 2018-10-07 HISTORY — PX: TOTAL HIP ARTHROPLASTY: SHX124

## 2018-10-07 LAB — GLUCOSE, CAPILLARY: Glucose-Capillary: 119 mg/dL — ABNORMAL HIGH (ref 70–99)

## 2018-10-07 SURGERY — ARTHROPLASTY, HIP, TOTAL, ANTERIOR APPROACH
Anesthesia: Monitor Anesthesia Care | Site: Hip | Laterality: Right

## 2018-10-07 MED ORDER — BISACODYL 10 MG RE SUPP: 10.0000 mg | Freq: Every day | RECTAL | Status: DC | PRN

## 2018-10-07 MED ORDER — PHENOL 1.4 % MT LIQD: 1.0000 | OROMUCOSAL | Status: DC | PRN

## 2018-10-07 MED ORDER — FERROUS SULFATE 325 (65 FE) MG PO TABS
325.0000 mg | ORAL_TABLET | Freq: Three times a day (TID) | ORAL | Status: DC
  Administered 2018-10-08 – 2018-10-09 (×5): 325 mg via ORAL
  Filled 2018-10-07 (×5): qty 1

## 2018-10-07 MED ORDER — METOCLOPRAMIDE HCL 5 MG PO TABS: 5.0000 mg | ORAL_TABLET | Freq: Three times a day (TID) | ORAL | Status: DC | PRN

## 2018-10-07 MED ORDER — ONDANSETRON HCL 4 MG/2ML IJ SOLN: 4.0000 mg | Freq: Four times a day (QID) | INTRAMUSCULAR | Status: DC | PRN

## 2018-10-07 MED ORDER — DEXAMETHASONE SODIUM PHOSPHATE 10 MG/ML IJ SOLN
10.0000 mg | Freq: Once | INTRAMUSCULAR | Status: AC
  Administered 2018-10-08: 10 mg via INTRAVENOUS
  Filled 2018-10-07: qty 1

## 2018-10-07 MED ORDER — CHLORHEXIDINE GLUCONATE 4 % EX LIQD: 60.0000 mL | Freq: Once | CUTANEOUS | Status: DC

## 2018-10-07 MED ORDER — DOCUSATE SODIUM 100 MG PO CAPS
100.0000 mg | ORAL_CAPSULE | Freq: Two times a day (BID) | ORAL | Status: DC
  Administered 2018-10-07 – 2018-10-09 (×4): 100 mg via ORAL
  Filled 2018-10-07 (×4): qty 1

## 2018-10-07 MED ORDER — LEVOTHYROXINE SODIUM 125 MCG PO TABS
125.0000 ug | ORAL_TABLET | Freq: Every day | ORAL | Status: DC
  Administered 2018-10-08 – 2018-10-09 (×2): 125 ug via ORAL
  Filled 2018-10-07 (×2): qty 1

## 2018-10-07 MED ORDER — DEXAMETHASONE SODIUM PHOSPHATE 10 MG/ML IJ SOLN
10.0000 mg | Freq: Once | INTRAMUSCULAR | Status: AC
  Administered 2018-10-07: 10 mg via INTRAVENOUS

## 2018-10-07 MED ORDER — POLYETHYLENE GLYCOL 3350 17 G PO PACK
17.0000 g | PACK | Freq: Two times a day (BID) | ORAL | Status: DC
  Administered 2018-10-08 – 2018-10-09 (×3): 17 g via ORAL
  Filled 2018-10-07 (×4): qty 1

## 2018-10-07 MED ORDER — ACETAMINOPHEN 325 MG PO TABS: 325.0000 mg | ORAL_TABLET | Freq: Four times a day (QID) | ORAL | Status: DC | PRN

## 2018-10-07 MED ORDER — TRANEXAMIC ACID-NACL 1000-0.7 MG/100ML-% IV SOLN
1000.0000 mg | Freq: Once | INTRAVENOUS | Status: AC
  Administered 2018-10-07: 1000 mg via INTRAVENOUS
  Filled 2018-10-07: qty 100

## 2018-10-07 MED ORDER — ALUM & MAG HYDROXIDE-SIMETH 200-200-20 MG/5ML PO SUSP: 15.0000 mL | ORAL | Status: DC | PRN

## 2018-10-07 MED ORDER — ONDANSETRON HCL 4 MG PO TABS: 4.0000 mg | ORAL_TABLET | Freq: Four times a day (QID) | ORAL | Status: DC | PRN

## 2018-10-07 MED ORDER — SODIUM CHLORIDE 0.9 % IV SOLN
INTRAVENOUS | Status: DC
  Administered 2018-10-07 – 2018-10-08 (×3): via INTRAVENOUS

## 2018-10-07 MED ORDER — DEXAMETHASONE SODIUM PHOSPHATE 10 MG/ML IJ SOLN
INTRAMUSCULAR | Status: AC
  Filled 2018-10-07: qty 1

## 2018-10-07 MED ORDER — METHOCARBAMOL 500 MG IVPB - SIMPLE MED
INTRAVENOUS | Status: AC
  Filled 2018-10-07: qty 50

## 2018-10-07 MED ORDER — PROPOFOL 10 MG/ML IV BOLUS
INTRAVENOUS | Status: AC
  Filled 2018-10-07: qty 20

## 2018-10-07 MED ORDER — MORPHINE SULFATE (PF) 2 MG/ML IV SOLN
0.5000 mg | INTRAVENOUS | Status: DC | PRN
  Administered 2018-10-07 – 2018-10-08 (×2): 1 mg via INTRAVENOUS
  Filled 2018-10-07 (×2): qty 1

## 2018-10-07 MED ORDER — MENTHOL 3 MG MT LOZG: 1.0000 | LOZENGE | OROMUCOSAL | Status: DC | PRN

## 2018-10-07 MED ORDER — FENTANYL CITRATE (PF) 100 MCG/2ML IJ SOLN
INTRAMUSCULAR | Status: AC
  Filled 2018-10-07: qty 2

## 2018-10-07 MED ORDER — OXYCODONE HCL 5 MG PO TABS: 5.0000 mg | ORAL_TABLET | Freq: Once | ORAL | Status: DC | PRN

## 2018-10-07 MED ORDER — LACTATED RINGERS IV SOLN
INTRAVENOUS | Status: DC
  Administered 2018-10-07 (×3): via INTRAVENOUS

## 2018-10-07 MED ORDER — HYDROCODONE-ACETAMINOPHEN 7.5-325 MG PO TABS: 1.0000 | ORAL_TABLET | ORAL | Status: DC | PRN

## 2018-10-07 MED ORDER — DIPHENHYDRAMINE HCL 12.5 MG/5ML PO ELIX: 12.5000 mg | ORAL_SOLUTION | ORAL | Status: DC | PRN

## 2018-10-07 MED ORDER — ASPIRIN 81 MG PO CHEW
81.0000 mg | CHEWABLE_TABLET | Freq: Two times a day (BID) | ORAL | Status: DC
  Administered 2018-10-07 – 2018-10-09 (×4): 81 mg via ORAL
  Filled 2018-10-07 (×4): qty 1

## 2018-10-07 MED ORDER — BUPIVACAINE IN DEXTROSE 0.75-8.25 % IT SOLN
INTRATHECAL | Status: DC | PRN
  Administered 2018-10-07: 1.8 mL via INTRATHECAL

## 2018-10-07 MED ORDER — OXYCODONE HCL 5 MG/5ML PO SOLN: 5.0000 mg | Freq: Once | ORAL | Status: DC | PRN

## 2018-10-07 MED ORDER — METHOCARBAMOL 500 MG IVPB - SIMPLE MED
500.0000 mg | Freq: Four times a day (QID) | INTRAVENOUS | Status: DC | PRN
  Administered 2018-10-07: 500 mg via INTRAVENOUS
  Filled 2018-10-07: qty 50

## 2018-10-07 MED ORDER — CEFAZOLIN SODIUM-DEXTROSE 2-4 GM/100ML-% IV SOLN
2.0000 g | INTRAVENOUS | Status: AC
  Administered 2018-10-07: 2 g via INTRAVENOUS
  Filled 2018-10-07: qty 100

## 2018-10-07 MED ORDER — TRANEXAMIC ACID-NACL 1000-0.7 MG/100ML-% IV SOLN
1000.0000 mg | INTRAVENOUS | Status: AC
  Administered 2018-10-07: 1000 mg via INTRAVENOUS
  Filled 2018-10-07: qty 100

## 2018-10-07 MED ORDER — STERILE WATER FOR IRRIGATION IR SOLN
Status: DC | PRN
  Administered 2018-10-07: 2000 mL

## 2018-10-07 MED ORDER — PROPOFOL 500 MG/50ML IV EMUL
INTRAVENOUS | Status: DC | PRN
  Administered 2018-10-07: 100 ug/kg/min via INTRAVENOUS

## 2018-10-07 MED ORDER — PROPOFOL 10 MG/ML IV BOLUS
INTRAVENOUS | Status: AC
  Filled 2018-10-07: qty 40

## 2018-10-07 MED ORDER — HYDROCODONE-ACETAMINOPHEN 5-325 MG PO TABS
1.0000 | ORAL_TABLET | ORAL | Status: DC | PRN
  Administered 2018-10-07 (×2): 2 via ORAL
  Administered 2018-10-08 (×2): 1 via ORAL
  Administered 2018-10-08: 2 via ORAL
  Administered 2018-10-09 (×3): 1 via ORAL
  Filled 2018-10-07: qty 1
  Filled 2018-10-07: qty 2
  Filled 2018-10-07 (×3): qty 1
  Filled 2018-10-07 (×2): qty 2
  Filled 2018-10-07 (×2): qty 1
  Filled 2018-10-07: qty 2

## 2018-10-07 MED ORDER — ONDANSETRON HCL 4 MG/2ML IJ SOLN
INTRAMUSCULAR | Status: DC | PRN
  Administered 2018-10-07: 4 mg via INTRAVENOUS

## 2018-10-07 MED ORDER — ACETAMINOPHEN 10 MG/ML IV SOLN: 1000.0000 mg | Freq: Once | INTRAVENOUS | Status: DC | PRN

## 2018-10-07 MED ORDER — MIDAZOLAM HCL 5 MG/5ML IJ SOLN
INTRAMUSCULAR | Status: DC | PRN
  Administered 2018-10-07 (×2): 1 mg via INTRAVENOUS

## 2018-10-07 MED ORDER — MIDAZOLAM HCL 2 MG/2ML IJ SOLN
INTRAMUSCULAR | Status: AC
  Filled 2018-10-07: qty 2

## 2018-10-07 MED ORDER — FENTANYL CITRATE (PF) 100 MCG/2ML IJ SOLN
INTRAMUSCULAR | Status: DC | PRN
  Administered 2018-10-07 (×2): 50 ug via INTRAVENOUS

## 2018-10-07 MED ORDER — FENTANYL CITRATE (PF) 100 MCG/2ML IJ SOLN
25.0000 ug | INTRAMUSCULAR | Status: DC | PRN
  Administered 2018-10-07 (×3): 50 ug via INTRAVENOUS

## 2018-10-07 MED ORDER — PHENYLEPHRINE 40 MCG/ML (10ML) SYRINGE FOR IV PUSH (FOR BLOOD PRESSURE SUPPORT)
PREFILLED_SYRINGE | INTRAVENOUS | Status: DC | PRN
  Administered 2018-10-07: 120 ug via INTRAVENOUS
  Administered 2018-10-07 (×2): 80 ug via INTRAVENOUS

## 2018-10-07 MED ORDER — METOCLOPRAMIDE HCL 5 MG/ML IJ SOLN: 5.0000 mg | Freq: Three times a day (TID) | INTRAMUSCULAR | Status: DC | PRN

## 2018-10-07 MED ORDER — ACETAMINOPHEN 160 MG/5ML PO SOLN: 1000.0000 mg | Freq: Once | ORAL | Status: DC | PRN

## 2018-10-07 MED ORDER — CEFAZOLIN SODIUM-DEXTROSE 2-4 GM/100ML-% IV SOLN
2.0000 g | Freq: Four times a day (QID) | INTRAVENOUS | Status: AC
  Administered 2018-10-07 (×2): 2 g via INTRAVENOUS
  Filled 2018-10-07 (×2): qty 100

## 2018-10-07 MED ORDER — ACETAMINOPHEN 500 MG PO TABS: 1000.0000 mg | ORAL_TABLET | Freq: Once | ORAL | Status: DC | PRN

## 2018-10-07 MED ORDER — SODIUM CHLORIDE 0.9 % IR SOLN
Status: DC | PRN
  Administered 2018-10-07: 1000 mL

## 2018-10-07 MED ORDER — METHOCARBAMOL 500 MG PO TABS
500.0000 mg | ORAL_TABLET | Freq: Four times a day (QID) | ORAL | Status: DC | PRN
  Administered 2018-10-07 – 2018-10-08 (×2): 500 mg via ORAL
  Filled 2018-10-07 (×2): qty 1

## 2018-10-07 MED ORDER — ONDANSETRON HCL 4 MG/2ML IJ SOLN
INTRAMUSCULAR | Status: AC
  Filled 2018-10-07: qty 2

## 2018-10-07 MED ORDER — PROPOFOL 10 MG/ML IV BOLUS
INTRAVENOUS | Status: AC
  Filled 2018-10-07: qty 60

## 2018-10-07 MED ORDER — FENTANYL CITRATE (PF) 100 MCG/2ML IJ SOLN
INTRAMUSCULAR | Status: AC
  Filled 2018-10-07: qty 4

## 2018-10-07 MED ORDER — MAGNESIUM CITRATE PO SOLN: 1.0000 | Freq: Once | ORAL | Status: DC | PRN

## 2018-10-07 SURGICAL SUPPLY — 41 items
ADH SKN CLS APL DERMABOND .7 (GAUZE/BANDAGES/DRESSINGS) ×1
BAG SPEC THK2 15X12 ZIP CLS (MISCELLANEOUS) ×1
BAG ZIPLOCK 12X15 (MISCELLANEOUS) ×2 IMPLANT
BLADE SAG 18X100X1.27 (BLADE) ×3 IMPLANT
BLADE SURG SZ10 CARB STEEL (BLADE) ×6 IMPLANT
CHLORAPREP W/TINT 26ML (MISCELLANEOUS) ×2 IMPLANT
COVER PERINEAL POST (MISCELLANEOUS) ×3 IMPLANT
COVER SURGICAL LIGHT HANDLE (MISCELLANEOUS) ×3 IMPLANT
CUP ACETBLR 52 OD PINNACLE (Hips) ×2 IMPLANT
DERMABOND ADVANCED (GAUZE/BANDAGES/DRESSINGS) ×2
DERMABOND ADVANCED .7 DNX12 (GAUZE/BANDAGES/DRESSINGS) ×1 IMPLANT
DRAPE STERI IOBAN 125X83 (DRAPES) ×3 IMPLANT
DRAPE U-SHAPE 47X51 STRL (DRAPES) ×6 IMPLANT
DRESSING AQUACEL AG SP 3.5X10 (GAUZE/BANDAGES/DRESSINGS) ×1 IMPLANT
DRSG AQUACEL AG SP 3.5X10 (GAUZE/BANDAGES/DRESSINGS) ×3
ELECT REM PT RETURN 15FT ADLT (MISCELLANEOUS) ×3 IMPLANT
ELIMINATOR HOLE APEX DEPUY (Hips) ×2 IMPLANT
GLOVE BIOGEL PI IND STRL 6.5 (GLOVE) IMPLANT
GLOVE BIOGEL PI IND STRL 7.0 (GLOVE) IMPLANT
GLOVE BIOGEL PI IND STRL 7.5 (GLOVE) ×1 IMPLANT
GLOVE BIOGEL PI INDICATOR 6.5 (GLOVE) ×2
GLOVE BIOGEL PI INDICATOR 7.0 (GLOVE) ×4
GLOVE BIOGEL PI INDICATOR 7.5 (GLOVE) ×8
GLOVE SURG SS PI 6.0 STRL IVOR (GLOVE) ×2 IMPLANT
GLOVE SURG SS PI 6.5 STRL IVOR (GLOVE) ×2 IMPLANT
GLOVE SURG SS PI 7.5 STRL IVOR (GLOVE) ×6 IMPLANT
GOWN STRL REUS W/TWL LRG LVL3 (GOWN DISPOSABLE) ×7 IMPLANT
GOWN STRL REUS W/TWL XL LVL3 (GOWN DISPOSABLE) ×4 IMPLANT
HEAD CERAMIC DELTA 36 PLUS 1.5 (Hips) ×2 IMPLANT
HOLDER FOLEY CATH W/STRAP (MISCELLANEOUS) ×3 IMPLANT
LINER NEUTRAL 52X36MM PLUS 4 (Liner) ×2 IMPLANT
PACK ANTERIOR HIP CUSTOM (KITS) ×3 IMPLANT
SCREW 6.5MMX25MM (Screw) ×2 IMPLANT
STEM FEMORAL SZ5 HIGH ACTIS (Nail) ×2 IMPLANT
SUT MNCRL AB 4-0 PS2 18 (SUTURE) ×3 IMPLANT
SUT STRATAFIX 0 PDS 27 VIOLET (SUTURE) ×3
SUT VIC AB 1 CT1 36 (SUTURE) ×9 IMPLANT
SUT VIC AB 2-0 CT1 27 (SUTURE) ×9
SUT VIC AB 2-0 CT1 TAPERPNT 27 (SUTURE) ×2 IMPLANT
SUTURE STRATFX 0 PDS 27 VIOLET (SUTURE) ×1 IMPLANT
YANKAUER SUCT BULB TIP 10FT TU (MISCELLANEOUS) ×2 IMPLANT

## 2018-10-07 NOTE — Anesthesia Postprocedure Evaluation (Signed)
Anesthesia Post Note  Patient: Allison Compton  Procedure(s) Performed: TOTAL HIP ARTHROPLASTY ANTERIOR APPROACH (Right Hip)     Patient location during evaluation: PACU Anesthesia Type: MAC and Spinal Level of consciousness: awake and alert Pain management: pain level controlled Vital Signs Assessment: post-procedure vital signs reviewed and stable Respiratory status: spontaneous breathing and respiratory function stable Cardiovascular status: blood pressure returned to baseline and stable Postop Assessment: spinal receding Anesthetic complications: no    Last Vitals:  Vitals:   10/07/18 1600 10/07/18 1617  BP: 111/74 (!) 107/52  Pulse: 62 66  Resp: 20 18  Temp:  (!) 36.3 C  SpO2: 100% 98%    Last Pain:  Vitals:   10/07/18 1617  TempSrc: Oral  PainSc:                  [name]

## 2018-10-07 NOTE — Op Note (Signed)
NAME:  Allison Compton                ACCOUNT NO.: [id number]      MEDICAL RECORD NO.: [id number]      FACILITY:  [facility]      PHYSICIAN:  Mauri Pole  DATE OF BIRTH:  [birth year]     DATE OF PROCEDURE:  10/07/2018                                 OPERATIVE REPORT         PREOPERATIVE DIAGNOSIS: Right  hip osteoarthritis.      POSTOPERATIVE DIAGNOSIS:  Right hip osteoarthritis.      PROCEDURE:  Right total hip replacement through an anterior approach   utilizing DePuy THR system, component size 29mm pinnacle cup, a size 36+4 neutral   Altrex liner, a size 5Hi Actis stem with a 36+1.5 delta ceramic   ball.      SURGEON:  Pietro Cassis. Alvan Dame, M.D.      ASSISTANT:  Griffith Citron, PA-C     ANESTHESIA:  Spinal.      SPECIMENS:  None.      COMPLICATIONS:  None.      BLOOD LOSS:  500 cc     DRAINS:  None.      INDICATION OF THE PROCEDURE:  Allison Compton is a 53 y.o. female who had   presented to office for evaluation of right hip pain.  Radiographs revealed   progressive degenerative changes with bone-on-bone   articulation of the  hip joint, including subchondral cystic changes and osteophytes.  The patient had painful limited range of   motion significantly affecting their overall quality of life and function.  The patient was failing to    respond to conservative measures including medications and/or injections and activity modification and at this point was ready   to proceed with more definitive measures.  Consent was obtained for   benefit of pain relief.  Specific risks of infection, DVT, component   failure, dislocation, neurovascular injury, and need for revision surgery were reviewed in the office as well discussion of   the anterior versus posterior approach were reviewed.     PROCEDURE IN DETAIL:  The patient was brought to operative theater.   Once adequate anesthesia, preoperative antibiotics, 2 gm of Ancef, 1 gm of Tranexamic Acid, and 10 mg  of Decadron were administered, the patient was positioned supine on the [employer] table.  Once the patient was safely positioned with adequate padding of boney prominences we predraped out the hip, and used fluoroscopy to confirm orientation of the pelvis.      The right hip was then prepped and draped from proximal iliac crest to   mid thigh with a shower curtain technique.      Time-out was performed identifying the patient, planned procedure, and the appropriate extremity.     An incision was then made 2 cm lateral to the   anterior superior iliac spine extending over the orientation of the   tensor fascia lata muscle and sharp dissection was carried down to the   fascia of the muscle.      The fascia was then incised.  The muscle belly was identified and swept   laterally and retractor placed along the superior neck.  Following   cauterization of the circumflex vessels and removing some pericapsular  fat, a second cobra retractor was placed on the inferior neck.  A T-capsulotomy was made along the line of the   superior neck to the trochanteric fossa, then extended proximally and   distally.  Tag sutures were placed and the retractors were then placed   intracapsular.  We then identified the trochanteric fossa and   orientation of my neck cut and then made a neck osteotomy with the femur on traction.  The femoral   head was removed without difficulty or complication.  Traction was let   off and retractors were placed posterior and anterior around the   acetabulum.      The labrum and foveal tissue were debrided.  I began reaming with a 44 mm   reamer and reamed up to 51 mm reamer with good bony bed preparation and a 52 mm  cup was chosen.  The final 52 mm Pinnacle cup was then impacted under fluoroscopy to confirm the depth of penetration and orientation with respect to   Abduction and forward flexion.  A screw was placed into the ilium followed by the hole eliminator.  The final    36+4 neutral Altrex liner was impacted with good visualized rim fit.  The cup was positioned anatomically within the acetabular portion of the pelvis.      At this point, the femur was rolled to 100 degrees.  Further capsule was   released off the inferior aspect of the femoral neck.  I then   released the superior capsule proximally.  With the leg in a neutral position the hook was placed laterally   along the femur under the vastus lateralis origin and elevated manually and then held in position using the hook attachment on the bed.  The leg was then extended and adducted with the leg rolled to 100   degrees of external rotation.  Retractors were placed along the medial calcar and posteriorly over the greater trochanter.  Once the proximal femur was fully   exposed, I used a box osteotome to set orientation.  I then began   broaching with the starting chili pepper broach and passed this by hand and then broached up to 5.  With the 5 broach in place I chose a high offset neck and did several trial reductions.  The offset was appropriate, leg lengths   appeared to be equal best matched with the +1.5 head ball trial confirmed radiographically.   Given these findings, I went ahead and dislocated the hip, repositioned all   retractors and positioned the right hip in the extended and abducted position.  The final 5 Hi Actis stem was   chosen and it was impacted down to the level of neck cut.  Based on this   and the trial reductions, a final 36+1.5 delta ceramic ball was chosen and   impacted onto a clean and dry trunnion, and the hip was reduced.  The   hip had been irrigated throughout the case again at this point.  I did   reapproximate the superior capsular leaflet to the anterior leaflet   using #1 Vicryl.  The fascia of the   tensor fascia lata muscle was then reapproximated using #1 Vicryl and #0 Stratafix sutures.  The   remaining wound was closed with 2-0 Vicryl and running 4-0 Monocryl.    The hip was cleaned, dried, and dressed sterilely using Dermabond and   Aquacel dressing.  The patient was then brought   to recovery room in  stable condition tolerating the procedure well.    Griffith Citron, PA-C was present for the entirety of the case involved from   preoperative positioning, perioperative retractor management, general   facilitation of the case, as well as primary wound closure as assistant.            Pietro Cassis Alvan Dame, M.D.        10/07/2018 2:14 PM

## 2018-10-07 NOTE — Anesthesia Procedure Notes (Signed)
Procedure Name: MAC Date/Time: 10/07/2018 12:30 PM Performed by: Maxwell Caul, CRNA Pre-anesthesia Checklist: Patient identified, Emergency Drugs available, Suction available and Patient being monitored Oxygen Delivery Method: Simple face mask

## 2018-10-07 NOTE — Anesthesia Procedure Notes (Signed)
Spinal  End time: 10/07/2018 12:36 PM Staffing Anesthesiologist: Oleta Mouse, MD Resident/CRNA: Maxwell Caul, CRNA Performed: resident/CRNA  Preanesthetic Checklist Completed: patient identified, site marked, surgical consent, pre-op evaluation, timeout performed, IV checked, risks and benefits discussed and monitors and equipment checked Spinal Block Patient position: sitting Prep: DuraPrep Patient monitoring: heart rate, continuous pulse ox and blood pressure Approach: midline Location: L3-4 Injection technique: single-shot Needle Needle type: Pencan  Needle gauge: 24 G Needle length: 10 cm Additional Notes Patient sitting for SAB placement on stretcher. Negative heme/paresthesia. Single shot. Patient tolerated well. Expiration date 06-13-2019. LOT # [id number].

## 2018-10-07 NOTE — Transfer of Care (Signed)
Immediate Anesthesia Transfer of Care Note  Patient: Allison Compton  Procedure(s) Performed: TOTAL HIP ARTHROPLASTY ANTERIOR APPROACH (Right Hip)  Patient Location: PACU  Anesthesia Type:Spinal  Level of Consciousness: awake and alert   Airway & Oxygen Therapy: Patient Spontanous Breathing and Patient connected to face mask oxygen  Post-op Assessment: Report given to RN and Post -op Vital signs reviewed and stable  Post vital signs: Reviewed and stable  Last Vitals:  Vitals Value Taken Time  BP 127/71 10/07/2018  2:53 PM  Temp    Pulse 80 10/07/2018  2:54 PM  Resp 15 10/07/2018  2:54 PM  SpO2 99 % 10/07/2018  2:54 PM  Vitals shown include unvalidated device data.  Last Pain:  Vitals:   10/07/18 1142  TempSrc:   PainSc: 2       Patients Stated Pain Goal: 4 (36/46/80 3212)  Complications: No apparent anesthesia complications

## 2018-10-07 NOTE — Plan of Care (Signed)

## 2018-10-07 NOTE — Evaluation (Signed)
Physical Therapy Evaluation Patient Details Name: Allison Compton MRN: [id number] DOB: 1966-06-05 Today's Date: 10/07/2018   History of Present Illness  53 yo female s/p R DA-THA on 10/07/18. PMH includes bilateral LE weakness and edema, obesity, HLD, DDD, Hashimoto's disease.   Clinical Impression   Pt presents with R hip pain, post-operative RLE weakness, difficulty performing mobility tasks, and decreased activity tolerance.  Pt to benefit from acute PT to address deficits. Pt ambulated short room distance, limited by pain and nausea/pallor/hypotension. Pt educated on ankle pumps (20/hour) to perform this afternoon/evening to increase circulation, to pt's tolerance and limited by pain. PT to progress mobility as tolerated, and will continue to follow acutely.        Follow Up Recommendations Follow surgeon's recommendation for DC plan and follow-up therapies;Supervision for mobility/OOB    Equipment Recommendations  None recommended by PT    Recommendations for Other Services       Precautions / Restrictions        Mobility  Bed Mobility Overal bed mobility: Needs Assistance Bed Mobility: Supine to Sit;Sit to Supine     Supine to sit: Min assist;HOB elevated Sit to supine: Mod assist;HOB elevated   General bed mobility comments: Min assist for supine to sit for RLE lifting and translation to EOB, scoot assist to EOB. Mod assist for sit to supine for lifting RLE into bed, positioning.   Transfers Overall transfer level: Needs assistance Equipment used: Rolling walker (2 wheeled) Transfers: Sit to/from Stand Sit to Stand: Min guard;From elevated surface         General transfer comment: min gaurd for safety. Verbal cuing for hand placement (one hand pushing off of bed, one of RW). Pt with decreased WB on RLE due to pain.   Ambulation/Gait Ambulation/Gait assistance: Min guard Gait Distance (Feet): 10 Feet Assistive device: Rolling walker (2 wheeled) Gait  Pattern/deviations: Step-through pattern;Decreased stride length;Decreased weight shift to right;Decreased stance time - right;Antalgic Gait velocity: decr    General Gait Details: Min guard for safety. Verbal cuing for sequencing, placement in RW. After 5 or so pt, pt reporting feeling dizzy, nauseous and hot. Pt insisted on walking back to bed, and once at bedside pt returned to supine positioning due to suspected vagal response. PT gave pt cool cloth, pt smelled alcohol swab to decrease nausea. BP and HR 107/89 and 65 bpm once pt in supine for ~2 minutes, unable to get vitals immediately.   Stairs            Wheelchair Mobility    Modified Rankin (Stroke Patients Only)       Balance Overall balance assessment: Mild deficits observed, not formally tested                                           Pertinent Vitals/Pain Pain Assessment: 0-10 Pain Score: 6  Pain Location: R hip  Pain Descriptors / Indicators: Sore;Tightness Pain Intervention(s): Limited activity within patient's tolerance;Repositioned;Ice applied;Monitored during session;Premedicated before session    Allison Compton expects to be discharged to:: Private residence Living Arrangements: Spouse/significant other Available Help at Discharge: Available 24 hours/day;Family(Pt's husband has 2 weeks off of work to assist) Type of Home: House Home Access: Stairs to enter Entrance Stairs-Rails: Psychiatric nurse of Steps: [place]: Multi-level;Bed/bath upstairs Home Equipment: Environmental consultant - 2 wheels;Bedside commode      Prior Function  Level of Independence: Independent               Hand Dominance   Dominant Hand: Right    Extremity/Trunk Assessment   Upper Extremity Assessment Upper Extremity Assessment: Overall WFL for tasks assessed    Lower Extremity Assessment Lower Extremity Assessment: Generalized weakness;RLE deficits/detail RLE Deficits /  Details: suspected post-surgical hip weakness; able to perform ankle pumps, quad sets, heel slides, SLR with lift assist RLE Sensation: decreased light touch;WNL(decreased sensation in gluteal region)    Cervical / Trunk Assessment Cervical / Trunk Assessment: Normal  Communication   Communication: No difficulties  Cognition Arousal/Alertness: Awake/alert Behavior During Therapy: WFL for tasks assessed/performed Overall Cognitive Status: Within Functional Limits for tasks assessed                                        General Comments      Exercises     Assessment/Plan    PT Assessment Patient needs continued PT services  PT Problem List Decreased strength;Pain;Decreased range of motion;Decreased activity tolerance;Decreased knowledge of use of DME;Decreased balance;Decreased mobility       PT Treatment Interventions DME instruction;Therapeutic activities;Gait training;Therapeutic exercise;Patient/family education;Balance training;Stair training;Functional mobility training    PT Goals (Current goals can be found in the Care Plan section)  Acute Rehab PT Goals Patient Stated Goal: progress mobility  PT Goal Formulation: With patient Time For Goal Achievement: 10/14/18 Potential to Achieve Goals: Good    Frequency 7X/week   Barriers to discharge        Co-evaluation               AM-PAC PT "6 Clicks" Mobility  Outcome Measure Help needed turning from your back to your side while in a flat bed without using bedrails?: A Little Help needed moving from lying on your back to sitting on the side of a flat bed without using bedrails?: A Little Help needed moving to and from a bed to a chair (including a wheelchair)?: A Little Help needed standing up from a chair using your arms (e.g., wheelchair or bedside chair)?: A Little Help needed to walk in hospital room?: A Little Help needed climbing 3-5 steps with a railing? : A Little 6 Click Score: 18     End of Session Equipment Utilized During Treatment: Gait belt Activity Tolerance: Patient limited by fatigue;Treatment limited secondary to medical complications (Comment) Patient left: in bed;with bed alarm set;with call bell/phone within reach;with nursing/sitter in room(pt on break from SCDs for skin integrity) Nurse Communication: Mobility status PT Visit Diagnosis: Other abnormalities of gait and mobility (R26.89);Difficulty in walking, not elsewhere classified (R26.2)    Time: 1920-1950 PT Time Calculation (min) (ACUTE ONLY): 30 min   Charges:   PT Evaluation $PT Eval Low Complexity: 1 Low PT Treatments $Gait Training: 8-22 mins        Julien Girt, PT Acute Rehabilitation Services Pager [phone]  Office [phone]   Allison Compton 10/07/2018, 8:09 PM

## 2018-10-07 NOTE — Interval H&P Note (Signed)
History and Physical Interval Note:  10/07/2018 11:20 AM  Allison Compton  has presented today for surgery, with the diagnosis of Right hip osteoarthritis  The various methods of treatment have been discussed with the patient and family. After consideration of risks, benefits and other options for treatment, the patient has consented to  Procedure(s) with comments: TOTAL HIP ARTHROPLASTY ANTERIOR APPROACH (Right) - 70 mins as a surgical intervention .  The patient's history has been reviewed, patient examined, no change in status, stable for surgery.  I have reviewed the patient's chart and labs.  Questions were answered to the patient's satisfaction.     Mauri Pole

## 2018-10-08 LAB — BASIC METABOLIC PANEL
Anion gap: 7 (ref 5–15)
BUN: 11 mg/dL (ref 6–20)
CO2: 24 mmol/L (ref 22–32)
Calcium: 8.6 mg/dL — ABNORMAL LOW (ref 8.9–10.3)
Chloride: 105 mmol/L (ref 98–111)
Creatinine, Ser: 0.78 mg/dL (ref 0.44–1.00)
GFR calc Af Amer: >60 mL/min (ref >60)
Glucose, Bld: 172 mg/dL — ABNORMAL HIGH (ref 70–99)
Potassium: 3.9 mmol/L (ref 3.5–5.1)
Sodium: 136 mmol/L (ref 135–145)

## 2018-10-08 LAB — CBC
HCT: 34.6 % — ABNORMAL LOW (ref 36.0–46.0)
Hemoglobin: 11.1 g/dL — ABNORMAL LOW (ref 12.0–15.0)
MCH: 28.6 pg (ref 26.0–34.0)
MCHC: 32.1 g/dL (ref 30.0–36.0)
MCV: 89.2 fL (ref 80.0–100.0)
PLATELETS: 197 10*3/uL (ref 150–400)
RBC: 3.88 MIL/uL (ref 3.87–5.11)
RDW: 13.1 % (ref 11.5–15.5)
WBC: 14.9 10*3/uL — ABNORMAL HIGH (ref 4.0–10.5)
nRBC: 0 % (ref 0.0–0.2)

## 2018-10-08 MED ORDER — SODIUM CHLORIDE 0.9 % IV BOLUS
500.0000 mL | Freq: Once | INTRAVENOUS | Status: AC
  Administered 2018-10-08: 500 mL via INTRAVENOUS

## 2018-10-08 MED ORDER — HYDROCODONE-ACETAMINOPHEN 7.5-325 MG PO TABS: 1.0000 | ORAL_TABLET | ORAL | 0 refills | Status: DC | PRN

## 2018-10-08 MED ORDER — FERROUS SULFATE 325 (65 FE) MG PO TABS: 325.0000 mg | ORAL_TABLET | Freq: Three times a day (TID) | ORAL | 0 refills | Status: DC

## 2018-10-08 MED ORDER — METHOCARBAMOL 500 MG PO TABS: 500.0000 mg | ORAL_TABLET | Freq: Four times a day (QID) | ORAL | 0 refills | Status: DC | PRN

## 2018-10-08 MED ORDER — ASPIRIN 81 MG PO CHEW: 81.0000 mg | CHEWABLE_TABLET | Freq: Two times a day (BID) | ORAL | 0 refills | Status: AC

## 2018-10-08 NOTE — Plan of Care (Signed)
  Problem: Pain Managment: Goal: General experience of comfort will improve Outcome: Progressing   Problem: Elimination: Goal: Will not experience complications related to urinary retention Outcome: Progressing   Problem: Elimination: Goal: Will not experience complications related to bowel motility Outcome: Progressing   Problem: Coping: Goal: Level of anxiety will decrease Outcome: Progressing   Problem: Activity: Goal: Risk for activity intolerance will decrease Outcome: Progressing   Problem: Clinical Measurements: Goal: Respiratory complications will improve Outcome: Progressing   Problem: Clinical Measurements: Goal: Cardiovascular complication will be avoided Outcome: Progressing

## 2018-10-08 NOTE — Progress Notes (Addendum)
After the pt received the bolus, the pt's BP was 96/52 with a pulse of 81. Pt denied is currently asymptomatic. Griffith Citron, PA was notified. I will continue to monitor the pt.   Griffith Citron, [place] advised to continue to monitor the pt. If the pt's BP drops anymore or the pt becomes symptomatic, the PA will be notified. Alvan Dame, MD wants to keep the pt for an additional night. No new orders at this time. I will continue to monitor the pt.

## 2018-10-08 NOTE — Discharge Instructions (Signed)

## 2018-10-08 NOTE — Care Management Note (Signed)
Case Management Note  Patient Details  Name: Allison Compton MRN: [id number] Date of Birth: [birth year]  Subjective/Objective:                  Discharge planning  Action/Plan: DME-has rolling walker and commode at home [facility] Expected Discharge Date:  10/08/18               Expected Discharge Plan:  Home/Self Care  In-House Referral:     Discharge planning Services  CM Consult  Post Acute Care Choice:    Choice offered to:     DME Arranged:    DME Agency:     HH Arranged:    [place] Agency:     Status of Service:  Completed, signed off  If discussed at [employer] of Stay Meetings, dates discussed:    Additional Comments:  Leeroy Cha, RN 10/08/2018, 9:47 AM

## 2018-10-08 NOTE — Progress Notes (Signed)
Physical Therapy Treatment Patient Details Name: Allison Compton MRN: [id number] DOB: March 20, 1966 Today's Date: 10/08/2018    History of Present Illness 53 yo female s/p R DA-THA on 10/07/18. PMH includes bilateral LE weakness and edema, obesity, HLD, DDD, Hashimoto's disease.     PT Comments    POD # 1 am session Assisted OOB to amb.  General transfer comment: 50% VC's on proper hand placement and safety with turns.  required increased assist to perform stand to sit.  Assisted with amb.  General Gait Details: limited distance due to increased c/o dizziness and feeling "woozy".  standing BP was 107/46, HR 89 with pale facial/lip color.  Recliner brought to pt and returned to room.  Reported to RN.   Follow Up Recommendations  Follow surgeon's recommendation for DC plan and follow-up therapies;Supervision for mobility/OOB     Equipment Recommendations  None recommended by PT    Recommendations for Other Services       Precautions / Restrictions Precautions Precautions: None Restrictions Weight Bearing Restrictions: No Other Position/Activity Restrictions: WBAT    Mobility  Bed Mobility Overal bed mobility: Needs Assistance Bed Mobility: Supine to Sit     Supine to sit: Mod assist     General bed mobility comments: required increased assist and time.  Attempted to instruct pt how to use a belt to self assist R LE off bed.  Increased c/o muscle cramping with this activity.    Transfers Overall transfer level: Needs assistance   Transfers: Sit to/from Stand Sit to Stand: Min assist         General transfer comment: 50% VC's on proper hand placement and safety with turns.  required increased assist to perform stand to sit.    Ambulation/Gait Ambulation/Gait assistance: Min assist Gait Distance (Feet): 10 Feet Assistive device: Rolling walker (2 wheeled) Gait Pattern/deviations: Step-through pattern;Decreased stride length;Decreased weight shift to right;Decreased  stance time - right;Antalgic Gait velocity: decreased    General Gait Details: limited distance due to increased c/o dizziness and feeling "woozy".  standing BP was 107/46, HR 89 with pale facial/lip color.  Recliner brought to pt and returned to room.     Stairs             Wheelchair Mobility    Modified Rankin (Stroke Patients Only)       Balance                                            Cognition Arousal/Alertness: Suspect due to medications Behavior During Therapy: WFL for tasks assessed/performed Overall Cognitive Status: Within Functional Limits for tasks assessed                                 General Comments: slow and groggy      Exercises      General Comments        Pertinent Vitals/Pain Pain Assessment: Faces Pain Score: 5  Pain Location: R thigh/anterior "muscle" Pain Descriptors / Indicators: Sore;Tightness;Tender Pain Intervention(s): Monitored during session;Repositioned;Relaxation;Premedicated before session    Home Living                      Prior Function            PT Goals (current goals can now be found in the  care plan section) Progress towards PT goals: Progressing toward goals    Frequency    7X/week      PT Plan Current plan remains appropriate    Co-evaluation              AM-PAC PT "6 Clicks" Mobility   Outcome Measure  Help needed turning from your back to your side while in a flat bed without using bedrails?: A Little Help needed moving from lying on your back to sitting on the side of a flat bed without using bedrails?: A Little Help needed moving to and from a bed to a chair (including a wheelchair)?: A Little Help needed standing up from a chair using your arms (e.g., wheelchair or bedside chair)?: A Little Help needed to walk in hospital room?: A Little Help needed climbing 3-5 steps with a railing? : A Little 6 Click Score: 18    End of Session  Equipment Utilized During Treatment: Gait belt Activity Tolerance: Patient limited by fatigue;Treatment limited secondary to medical complications (Comment)(hypotensive) Patient left: in chair;with call bell/phone within reach Nurse Communication: Mobility status PT Visit Diagnosis: Other abnormalities of gait and mobility (R26.89);Difficulty in walking, not elsewhere classified (R26.2)     Time: 8016-5537 PT Time Calculation (min) (ACUTE ONLY): 28 min  Charges:  $Gait Training: 8-22 mins $Therapeutic Activity: 8-22 mins                     Rica Koyanagi  PTA Acute  Rehabilitation Services Pager      [phone] Office      [phone]

## 2018-10-08 NOTE — Progress Notes (Signed)
PHYSICAL THERAPY  Pt stated she got OOB with NT to bathroom and got "dizzy".  Pt in recliner on arrival.  Assisted with amb pt 10 feet when pt c/o increased dizziness and feeling "bad".  Standing BP was taken 107/46, HR 88.  Recliner brought to pt and returned to room.  Reported to RN.  Rica Koyanagi  PTA Acute  Rehabilitation Services Pager      [phone] Office      [phone]

## 2018-10-08 NOTE — Progress Notes (Signed)
   Subjective: 1 Day Post-Op Procedure(s) (LRB): TOTAL HIP ARTHROPLASTY ANTERIOR APPROACH (Right) Patient reports pain as moderate.   Patient seen in rounds with Dr. Alvan Dame. Patient is well, and has had no acute complaints or problems other than pain/muscle spasm in the right hip. No acute events overnight. Foley catheter removed, positive flatus. Denies CP, SHOB, calf pain.  We will continue therapy today.   Objective: Vital signs in last 24 hours: Temp:  [96.8 F (36 C)-98.3 F (36.8 C)] 98.3 F (36.8 C) (02/26 0408) Pulse Rate:  [56-78] 78 (02/26 0408) Resp:  [10-20] 16 (02/26 0408) BP: (107-138)/(52-88) 110/56 (02/26 0408) SpO2:  [95 %-100 %] 95 % (02/26 0408) Weight:  [101.2 kg] 101.2 kg (02/25 1142)  Intake/Output from previous day:  Intake/Output Summary (Last 24 hours) at 10/08/2018 0756 Last data filed at 10/08/2018 0600 Gross per 24 hour  Intake 5726.72 ml  Output 3900 ml  Net 1826.72 ml     Intake/Output this shift: No intake/output data recorded.  Labs: Recent Labs    10/08/18 0542  HGB 11.1*   Recent Labs    10/08/18 0542  WBC 14.9*  RBC 3.88  HCT 34.6*  PLT 197   Recent Labs    10/08/18 0542  NA 136  K 3.9  CL 105  CO2 24  BUN 11  CREATININE 0.78  GLUCOSE 172*  CALCIUM 8.6*   No results for input(s): LABPT, INR in the last 72 hours.  Exam: General - Patient is Alert and Oriented Extremity - Neurologically intact Sensation intact distally Intact pulses distally Dorsiflexion/Plantar flexion intact Dressing - dressing C/D/I Motor Function - intact, moving foot and toes well on exam.   Past Medical History:  Diagnosis Date  . Anal tear   . 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 Eczema   . Edema of both legs   . Elevated blood pressure reading   . Fibroids   . Hashimoto's disease   . Hyperlipidemia   . Pre-diabetes    Pt states she is "borderline" for yrs.  . Seasonal affective disorder (HCC)    PMH of     Assessment/Plan: 1 Day Post-Op Procedure(s) (LRB): TOTAL HIP ARTHROPLASTY ANTERIOR APPROACH (Right) Principal Problem:   S/p Right THA, AA Active Problems:   Status post total hip replacement, right  Estimated body mass index is 32.96 kg/m as calculated from the following:   Height as of this encounter: 5\' 9"  (1.753 m).   Weight as of this encounter: 101.2 kg. Advance diet Up with therapy D/C IV fluids  DVT Prophylaxis - Aspirin Weight bearing as tolerated. D/C O2 and pulse ox and try on room air.  Plan is to go Home after hospital stay with HEP. Possible discharge today as long as she is meeting goals with therapy and has adequate pain control. Follow up in the office with Dr. Alvan Dame in 2 weeks.   Griffith Citron, PA-C Orthopedic Surgery 10/08/2018, 7:56 AM

## 2018-10-08 NOTE — Progress Notes (Signed)
Physical Therapy Treatment Patient Details Name: Allison Compton MRN: [id number] DOB: Dec 15, 1965 Today's Date: 10/08/2018    History of Present Illness 53 yo female s/p R DA-THA on 10/07/18. PMH includes bilateral LE weakness and edema, obesity, HLD, DDD, Hashimoto's disease.     PT Comments    POD # 1 pm session Pt receiving IV fluids.  Assisted OOB to amb to bathroom,  General transfer comment: 50% VC's on proper hand placement and safety with turns.  required increased assist to perform stand to sit.  Also assisted with toilet transfer. General Gait Details: assisted with amb 10 feet to bathroom with increased and difficulty advancing R LE.  Assisted with amb out of bathroom back to bed due to fatigue.  No c/o dizziness but did c/o fatigue.   Unable to attempt stairs and unable to amb in hallway.      Follow Up Recommendations  Follow surgeon's recommendation for DC plan and follow-up therapies;Supervision for mobility/OOB     Equipment Recommendations  None recommended by PT    Recommendations for Other Services       Precautions / Restrictions Precautions Precautions: None Restrictions Weight Bearing Restrictions: No Other Position/Activity Restrictions: WBAT    Mobility  Bed Mobility Overal bed mobility: Needs Assistance Bed Mobility: Supine to Sit;Sit to Supine     Supine to sit: Mod assist Sit to supine: Mod assist;HOB elevated   General bed mobility comments: assisted OOB and back to bed Mod Assist B LE at same time to decrease pain  Transfers Overall transfer level: Needs assistance   Transfers: Sit to/from Stand Sit to Stand: Min assist         General transfer comment: 50% VC's on proper hand placement and safety with turns.  required increased assist to perform stand to sit.  Also assisted with toilet transfer.    Ambulation/Gait Ambulation/Gait assistance: Min assist Gait Distance (Feet): 20 Feet(10 feet x 2 to and from bathroom) Assistive device:  Rolling walker (2 wheeled) Gait Pattern/deviations: Step-through pattern;Decreased stride length;Decreased weight shift to right;Decreased stance time - right;Antalgic Gait velocity: decreased    General Gait Details: assisted with amb 10 feet to bathroom with increased and difficulty advancing R LE.  Assisted with amb out of bathroom back to bed due to fatigue.  No c/o dizziness but did c/o fatigue.     Stairs             Wheelchair Mobility    Modified Rankin (Stroke Patients Only)       Balance                                            Cognition Arousal/Alertness: Suspect due to medications Behavior During Therapy: WFL for tasks assessed/performed Overall Cognitive Status: Within Functional Limits for tasks assessed                                 General Comments: slow and groggy      Exercises  10 reps LAQ's  10 reps HS using belt     General Comments        Pertinent Vitals/Pain Pain Assessment: Faces Pain Score: 5  Pain Location: R thigh/anterior "muscle" Pain Descriptors / Indicators: Sore;Tightness;Tender Pain Intervention(s): Monitored during session;Repositioned;Relaxation;Premedicated before session    Home Living  Prior Function            PT Goals (current goals can now be found in the care plan section) Progress towards PT goals: Progressing toward goals    Frequency    7X/week      PT Plan Current plan remains appropriate    Co-evaluation              AM-PAC PT "6 Clicks" Mobility   Outcome Measure  Help needed turning from your back to your side while in a flat bed without using bedrails?: A Little Help needed moving from lying on your back to sitting on the side of a flat bed without using bedrails?: A Little Help needed moving to and from a bed to a chair (including a wheelchair)?: A Little Help needed standing up from a chair using your arms (e.g.,  wheelchair or bedside chair)?: A Little Help needed to walk in hospital room?: A Little Help needed climbing 3-5 steps with a railing? : A Little 6 Click Score: 18    End of Session Equipment Utilized During Treatment: Gait belt Activity Tolerance: Patient limited by fatigue;Treatment limited secondary to medical complications (Comment) Patient left: in bed;with bed alarm set;with call bell/phone within reach Nurse Communication: Mobility status PT Visit Diagnosis: Other abnormalities of gait and mobility (R26.89);Difficulty in walking, not elsewhere classified (R26.2)     Time: 5396-7289 PT Time Calculation (min) (ACUTE ONLY): 32 min  Charges:  $Gait Training: 8-22 mins $Therapeutic Exercise: 8-22 mins                      Rica Koyanagi  PTA Acute  Rehabilitation Services Pager      [phone] Office      [phone]

## 2018-10-08 NOTE — Progress Notes (Signed)
At 1115, Elmyra Ricks, NT reported that the pt c/o dizziness while ambulating back to bed from the bathroom. Upon assessment, the pt reported that the dizziness lasted for a few seconds and she is currently asymptomatic. The pt's vitals were WNL. The pt was provided additional education regarding safety. I will continue to monitor the pt.

## 2018-10-08 NOTE — Progress Notes (Addendum)
At 1147, Cecille Rubin, the PTA reported that the pt became dizzy during ambulation. BP is still trending low. Adrian Prince, PA was notified at 1147 via page. I will continue to monitor the pt.  Babish ordered a 500 ml bolus.

## 2018-10-09 ENCOUNTER — Encounter (HOSPITAL_COMMUNITY): Payer: Self-pay | Admitting: Orthopedic Surgery

## 2018-10-09 LAB — BASIC METABOLIC PANEL
Anion gap: 9 (ref 5–15)
BUN: 11 mg/dL (ref 6–20)
CO2: 22 mmol/L (ref 22–32)
CREATININE: 0.67 mg/dL (ref 0.44–1.00)
Calcium: 8.4 mg/dL — ABNORMAL LOW (ref 8.9–10.3)
Chloride: 108 mmol/L (ref 98–111)
GFR calc Af Amer: >60 mL/min (ref >60)
GFR calc non Af Amer: >60 mL/min (ref >60)
Glucose, Bld: 182 mg/dL — ABNORMAL HIGH (ref 70–99)
Potassium: 3.7 mmol/L (ref 3.5–5.1)
Sodium: 139 mmol/L (ref 135–145)

## 2018-10-09 LAB — CBC
HCT: 32.5 % — ABNORMAL LOW (ref 36.0–46.0)
Hemoglobin: 10.5 g/dL — ABNORMAL LOW (ref 12.0–15.0)
MCH: 28.8 pg (ref 26.0–34.0)
MCHC: 32.3 g/dL (ref 30.0–36.0)
MCV: 89.3 fL (ref 80.0–100.0)
Platelets: 172 10*3/uL (ref 150–400)
RBC: 3.64 MIL/uL — ABNORMAL LOW (ref 3.87–5.11)
RDW: 13.6 % (ref 11.5–15.5)
WBC: 15.3 10*3/uL — AB (ref 4.0–10.5)
nRBC: 0 % (ref 0.0–0.2)

## 2018-10-09 NOTE — Progress Notes (Signed)
Physical Therapy Treatment Patient Details Name: Allison Compton MRN: [id number] DOB: Aug 26, 1965 Today's Date: 10/09/2018    History of Present Illness 53 yo female s/p R DA-THA on 10/07/18. PMH includes bilateral LE weakness and edema, obesity, HLD, DDD, Hashimoto's disease.     PT Comments    Progressing with mobility. Will plan to have a 2nd session prior to d/c home later today.    Follow Up Recommendations  Follow surgeon's recommendation for DC plan and follow-up therapies;Supervision for mobility/OOB     Equipment Recommendations  None recommended by PT    Recommendations for Other Services       Precautions / Restrictions Precautions Precautions: None Restrictions Weight Bearing Restrictions: No Other Position/Activity Restrictions: WBAT    Mobility  Bed Mobility Overal bed mobility: Needs Assistance Bed Mobility: Supine to Sit;Sit to Supine     Supine to sit: Min assist;HOB elevated Sit to supine: Mod assist;HOB elevated   General bed mobility comments: Assist for R LE for supine to sit. Assist for bil LEs for sit to supine-at pt's request  Transfers Overall transfer level: Needs assistance Equipment used: Rolling walker (2 wheeled) Transfers: Sit to/from Stand Sit to Stand: Min guard;From elevated surface         General transfer comment: Close guard for safety. VCs safety, technique, hand/LE placement.   Ambulation/Gait Ambulation/Gait assistance: Min guard Gait Distance (Feet): 75 Feet(x2) Assistive device: Rolling walker (2 wheeled) Gait Pattern/deviations: Step-through pattern     General Gait Details: close guard for safety. Pt denies dizziness/lightheadedness. She did c/o fatigue. 1 standing rest break needed.   Stairs Stairs: Yes Stairs assistance: Min assist;Min guard Stair Management: Step to pattern;Forwards;With crutches;One rail Right Number of Stairs: 9 General stair comments: Practiced x 3-once with 2 hands on 1 rail, once with 1  rail + cane, once with 1 rail + crutch. VCs safety, technique, sequence. Pt tolerated task better when using the crutch and rail.    Wheelchair Mobility    Modified Rankin (Stroke Patients Only)       Balance Overall balance assessment: Mild deficits observed, not formally tested                                          Cognition Arousal/Alertness: Awake/alert Behavior During Therapy: WFL for tasks assessed/performed Overall Cognitive Status: Within Functional Limits for tasks assessed                                        Exercises Total Joint Exercises Ankle Circles/Pumps: AROM;Both;10 reps;Supine Quad Sets: AROM;Both;10 reps;Supine Heel Slides: AAROM;Right;10 reps;Supine Hip ABduction/ADduction: AAROM;Right;10 reps;Supine    General Comments        Pertinent Vitals/Pain Pain Assessment: 0-10 Pain Score: 7  Pain Location: R thigh/hip with activity Pain Descriptors / Indicators: Sore;Tightness;Tender Pain Intervention(s): Repositioned    Home Living                      Prior Function            PT Goals (current goals can now be found in the care plan section) Progress towards PT goals: Progressing toward goals    Frequency    7X/week      PT Plan Current plan remains appropriate    Co-evaluation  AM-PAC PT "6 Clicks" Mobility   Outcome Measure  Help needed turning from your back to your side while in a flat bed without using bedrails?: A Little Help needed moving from lying on your back to sitting on the side of a flat bed without using bedrails?: A Little Help needed moving to and from a bed to a chair (including a wheelchair)?: A Little Help needed standing up from a chair using your arms (e.g., wheelchair or bedside chair)?: A Little Help needed to walk in hospital room?: A Little Help needed climbing 3-5 steps with a railing? : A Little 6 Click Score: 18    End of Session    Activity Tolerance: Patient tolerated treatment well Patient left: in bed;with call bell/phone within reach   PT Visit Diagnosis: Other abnormalities of gait and mobility (R26.89);Difficulty in walking, not elsewhere classified (R26.2)     Time: 0511-0211 PT Time Calculation (min) (ACUTE ONLY): 46 min  Charges:  $Gait Training: 23-37 mins $Therapeutic Exercise: 8-22 mins                        Weston Anna, PT Acute Rehabilitation Services Pager: [phone] Office: [phone]

## 2018-10-09 NOTE — Progress Notes (Signed)
Physical Therapy Treatment Patient Details Name: Allison Compton MRN: [id number] DOB: 02/23/1966 Today's Date: 10/09/2018    History of Present Illness 53 yo female s/p R DA-THA on 10/07/18. PMH includes bilateral LE weakness and edema, obesity, HLD, DDD, Hashimoto's disease.     PT Comments    Progressing well with mobility. Reviewed exercises/HEP, gait training and stair training. Issued HEP for pt to perform 2x/day until she follow up with surgeon. All education completed-made RN aware   Follow Up Recommendations  Follow surgeon's recommendation for DC plan and follow-up therapies;Supervision for mobility/OOB     Equipment Recommendations  None recommended by PT    Recommendations for Other Services       Precautions / Restrictions Precautions Precautions: Fall Restrictions Weight Bearing Restrictions: No Other Position/Activity Restrictions: WBAT    Mobility  Bed Mobility Overal bed mobility: Needs Assistance Bed Mobility: Supine to Sit;Sit to Supine     Supine to sit: Min guard;HOB elevated Sit to supine: Min guard;HOB elevated   General bed mobility comments: Pt used gait belt strap to assist R LE on/off bed. Increased time. Close guard for safety.  Transfers Overall transfer level: Needs assistance Equipment used: Rolling walker (2 wheeled) Transfers: Sit to/from Stand Sit to Stand: Min guard;From elevated surface         General transfer comment: Close guard for safety. VCs safety, technique, hand/LE placement.   Ambulation/Gait Ambulation/Gait assistance: Min guard Gait Distance (Feet): 75 Feet(x2) Assistive device: Rolling walker (2 wheeled) Gait Pattern/deviations: Step-through pattern     General Gait Details: close guard for safety. Pt denies dizziness/lightheadedness. Pt c/o some pulling at incision site during gait-cued to decrease stride length a bit   Stairs Stairs: Yes Stairs assistance: Min guard Stair Management: Step to  pattern;Forwards;With crutches;One rail Right Number of Stairs: 3 General stair comments: Practiced stair negotiation again this afternoon so pt would feel comfortable with task.   Wheelchair Mobility    Modified Rankin (Stroke Patients Only)       Balance Overall balance assessment: Mild deficits observed, not formally tested                                          Cognition Arousal/Alertness: Awake/alert Behavior During Therapy: WFL for tasks assessed/performed Overall Cognitive Status: Within Functional Limits for tasks assessed                                        Exercises Total Joint Exercises Ankle Circles/Pumps: AROM;Both;10 reps;Supine Quad Sets: AROM;Both;10 reps;Supine Heel Slides: AAROM;Right;10 reps;Supine Hip ABduction/ADduction: AAROM;Right;10 reps;Supine    General Comments        Pertinent Vitals/Pain Pain Assessment: 0-10 Pain Score: 6  Pain Location: R thigh/hip with activity Pain Descriptors / Indicators: Sore;Tightness;Tender Pain Intervention(s): Monitored during session;Repositioned;Ice applied    Home Living                      Prior Function            PT Goals (current goals can now be found in the care plan section) Progress towards PT goals: Progressing toward goals    Frequency    7X/week      PT Plan Current plan remains appropriate    Co-evaluation  AM-PAC PT "6 Clicks" Mobility   Outcome Measure  Help needed turning from your back to your side while in a flat bed without using bedrails?: A Little Help needed moving from lying on your back to sitting on the side of a flat bed without using bedrails?: A Little Help needed moving to and from a bed to a chair (including a wheelchair)?: A Little Help needed standing up from a chair using your arms (e.g., wheelchair or bedside chair)?: A Little Help needed to walk in hospital room?: A Little Help needed  climbing 3-5 steps with a railing? : A Little 6 Click Score: 18    End of Session   Activity Tolerance: Patient tolerated treatment well Patient left: in bed;with call bell/phone within reach   PT Visit Diagnosis: Other abnormalities of gait and mobility (R26.89);Difficulty in walking, not elsewhere classified (R26.2)     Time: 3016-0109 PT Time Calculation (min) (ACUTE ONLY): 27 min  Charges:  $Gait Training: 23-37 mins $Therapeutic Exercise: 8-22 mins                        Weston Anna, PT Acute Rehabilitation Services Pager: [phone] Office: [phone]

## 2018-10-09 NOTE — Progress Notes (Signed)
At 1657, the pt and pt's Husband was provided with d/c instructions. After discussing the pt's plan of care, the pt and pt's husband reported no further questions or concerns.

## 2018-10-09 NOTE — Progress Notes (Signed)
   Subjective: 2 Days Post-Op Procedure(s) (LRB): TOTAL HIP ARTHROPLASTY ANTERIOR APPROACH (Right) Patient reports pain as moderate.   Patient seen in rounds with Dr. Alvan Dame. Patient is well, and has had no acute complaints or problems other than pain/muscle spasm in the right hip. No acute events overnight. Urinating without difficulty, positive flatus. Denies CP, SHOB, calf pain.  We will continue therapy today.   Objective: Vital signs in last 24 hours: Temp:  [97.7 F (36.5 C)-98.7 F (37.1 C)] 97.7 F (36.5 C) (02/27 0616) Pulse Rate:  [70-88] 71 (02/27 0616) Resp:  [14-18] 14 (02/27 0616) BP: (96-125)/(46-68) 125/68 (02/27 0616) SpO2:  [95 %-100 %] 95 % (02/27 0616)  Intake/Output from previous day:  Intake/Output Summary (Last 24 hours) at 10/09/2018 0739 Last data filed at 10/09/2018 0616 Gross per 24 hour  Intake 3511.8 ml  Output 4250 ml  Net -738.2 ml     Intake/Output this shift: No intake/output data recorded.  Labs: Recent Labs    10/08/18 0542 10/09/18 0455  HGB 11.1* 10.5*   Recent Labs    10/08/18 0542 10/09/18 0455  WBC 14.9* 15.3*  RBC 3.88 3.64*  HCT 34.6* 32.5*  PLT 197 172   Recent Labs    10/08/18 0542 10/09/18 0455  NA 136 139  K 3.9 3.7  CL 105 108  CO2 24 22  BUN 11 11  CREATININE 0.78 0.67  GLUCOSE 172* 182*  CALCIUM 8.6* 8.4*   No results for input(s): LABPT, INR in the last 72 hours.  Exam: General - Patient is Alert and Oriented Extremity - Neurologically intact Sensation intact distally Intact pulses distally Dorsiflexion/Plantar flexion intact Dressing - dressing C/D/I Motor Function - intact, moving foot and toes well on exam.   Past Medical History:  Diagnosis Date  . Anal tear   . 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 Eczema   . Edema of both legs   . Elevated blood pressure reading   . Fibroids   . Hashimoto's disease   . Hyperlipidemia   . Pre-diabetes    Pt states she is  "borderline" for yrs.  . Seasonal affective disorder (HCC)    PMH of    Assessment/Plan: 2 Days Post-Op Procedure(s) (LRB): TOTAL HIP ARTHROPLASTY ANTERIOR APPROACH (Right) Principal Problem:   S/p Right THA, AA Active Problems:   Status post total hip replacement, right  Estimated body mass index is 32.96 kg/m as calculated from the following:   Height as of this encounter: 5\' 9"  (1.753 m).   Weight as of this encounter: 101.2 kg. Advance diet Up with therapy D/C IV fluids  DVT Prophylaxis - Aspirin Weight bearing as tolerated.   Patient had two episodes of dizziness yesterday while working with PT, with blood pressure of 107/46. Symptoms improved with 500 mL NS bolus, and BP today is improved.  Plan is to go Home after hospital stay. Plan for discharge today as long as she continues to meet goals with therapy. She is anxious about stairs at her home, and will work with PT today on this.   Griffith Citron, PA-C Orthopedic Surgery 10/09/2018, 7:39 AM

## 2018-10-10 NOTE — Discharge Summary (Signed)
Physician Discharge Summary   Patient ID: Allison Compton MRN: [id number] DOB/AGE: 03/15/1966 53 y.o.  Admit date: 10/07/2018 Discharge date: 10/09/2018  Primary Diagnosis:  Right hip osteoarthritis  Admission Diagnoses:  Past Medical History:  Diagnosis Date  . Anal tear   . 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 Eczema   . Edema of both legs   . Elevated blood pressure reading   . Fibroids   . Hashimoto's disease   . Hyperlipidemia   . Pre-diabetes    Pt states she is "borderline" for yrs.  . Seasonal affective disorder ([place])    PMH of   Discharge Diagnoses:   Principal Problem:   S/p Right THA, AA Active Problems:   Status post total hip replacement, right  Estimated body mass index is 32.96 kg/m as calculated from the following:   Height as of this encounter: 5\' 9"  (1.753 m).   Weight as of this encounter: 101.2 kg.  Procedure:  Procedure(s) (LRB): TOTAL HIP ARTHROPLASTY ANTERIOR APPROACH (Right)   Consults: None  HPI: Allison Compton, 53 y.o. female, has a history of pain and functional disability in the right hip(s) due to arthritis and patient has failed non-surgical conservative treatments for greater than 12 weeks to include NSAID's and/or analgesics, corticosteriod injections, supervised PT with diminished ADL's post treatment and activity modification.  Onset of symptoms was gradual starting 2.5 years ago with gradually worsening course since that time, significantly increased since Semptember.The patient noted no past surgery on the right hip(s).  Patient currently rates pain in the right hip at 9 out of 10 with activity. Patient has night pain, worsening of pain with activity and weight bearing, trendelenberg gait, pain that interfers with activities of daily living and pain with passive range of motion. Patient has evidence of periarticular osteophytes and joint space narrowing by imaging studies. This condition presents safety issues increasing  the risk of falls.  There is no current active infection.  Risks, benefits and expectations were discussed with the patient.  Risks including but not limited to the risk of anesthesia, blood clots, nerve damage, blood vessel damage, failure of the prosthesis, infection and up to and including death.  Patient understand the risks, benefits and expectations and wishes to proceed with surgery.   Laboratory Data: Admission on 10/07/2018, Discharged on 10/09/2018  Component Date Value Ref Range Status  . Glucose-Capillary 10/07/2018 119* 70 - 99 mg/dL Final  . WBC 10/08/2018 14.9* 4.0 - 10.5 K/uL Final  . RBC 10/08/2018 3.88  3.87 - 5.11 MIL/uL Final  . Hemoglobin 10/08/2018 11.1* 12.0 - 15.0 g/dL Final  . HCT 10/08/2018 34.6* 36.0 - 46.0 % Final  . MCV 10/08/2018 89.2  80.0 - 100.0 fL Final  . MCH 10/08/2018 28.6  26.0 - 34.0 pg Final  . MCHC 10/08/2018 32.1  30.0 - 36.0 g/dL Final  . RDW 10/08/2018 13.1  11.5 - 15.5 % Final  . Platelets 10/08/2018 197  150 - 400 K/uL Final  . nRBC 10/08/2018 0.0  0.0 - 0.2 % Final   Performed at [facility], [place] [address], [place], [place] [address]  . Sodium 10/08/2018 136  135 - 145 mmol/L Final  . Potassium 10/08/2018 3.9  3.5 - 5.1 mmol/L Final  . Chloride 10/08/2018 105  98 - 111 mmol/L Final  . CO2 10/08/2018 24  22 - 32 mmol/L Final  . Glucose, Bld 10/08/2018 172* 70 - 99 mg/dL Final  .  BUN 10/08/2018 11  6 - 20 mg/dL Final  . Creatinine, Ser 10/08/2018 0.78  0.44 - 1.00 mg/dL Final  . Calcium 10/08/2018 8.6* 8.9 - 10.3 mg/dL Final  . GFR calc non Af Amer 10/08/2018 >60  >60 mL/min Final  . GFR calc Af Amer 10/08/2018 >60  >60 mL/min Final  . Anion gap 10/08/2018 7  5 - 15 Final   Performed at [facility], [place] [address], [place], [place] [address]  . WBC 10/09/2018 15.3* 4.0 - 10.5 K/uL Final  . RBC 10/09/2018 3.64* 3.87 - 5.11 MIL/uL Final  . Hemoglobin 10/09/2018 10.5* 12.0 - 15.0 g/dL Final  . HCT  10/09/2018 32.5* 36.0 - 46.0 % Final  . MCV 10/09/2018 89.3  80.0 - 100.0 fL Final  . MCH 10/09/2018 28.8  26.0 - 34.0 pg Final  . MCHC 10/09/2018 32.3  30.0 - 36.0 g/dL Final  . RDW 10/09/2018 13.6  11.5 - 15.5 % Final  . Platelets 10/09/2018 172  150 - 400 K/uL Final  . nRBC 10/09/2018 0.0  0.0 - 0.2 % Final   Performed at [facility], [place] [address], [place], [place] [address]  . Sodium 10/09/2018 139  135 - 145 mmol/L Final  . Potassium 10/09/2018 3.7  3.5 - 5.1 mmol/L Final  . Chloride 10/09/2018 108  98 - 111 mmol/L Final  . CO2 10/09/2018 22  22 - 32 mmol/L Final  . Glucose, Bld 10/09/2018 182* 70 - 99 mg/dL Final  . BUN 10/09/2018 11  6 - 20 mg/dL Final  . Creatinine, Ser 10/09/2018 0.67  0.44 - 1.00 mg/dL Final  . Calcium 10/09/2018 8.4* 8.9 - 10.3 mg/dL Final  . GFR calc non Af Amer 10/09/2018 >60  >60 mL/min Final  . GFR calc Af Amer 10/09/2018 >60  >60 mL/min Final  . Anion gap 10/09/2018 9  5 - 15 Final   Performed at [facility], [place] [address], [place], [place] [address]  Hospital Outpatient Visit on 09/26/2018  Component Date Value Ref Range Status  . Hgb A1c MFr Bld 09/26/2018 6.0* 4.8 - 5.6 % Final   Comment: (NOTE) Pre diabetes:          5.7%-6.4% Diabetes:              >6.4% Glycemic control for   <7.0% adults with diabetes   . Mean Plasma Glucose 09/26/2018 125.5  mg/dL Final   Performed at [place] [address] [place], [place] [address]  . Sodium 09/26/2018 141  135 - 145 mmol/L Final  . Potassium 09/26/2018 4.1  3.5 - 5.1 mmol/L Final  . Chloride 09/26/2018 105  98 - 111 mmol/L Final  . CO2 09/26/2018 26  22 - 32 mmol/L Final  . Glucose, Bld 09/26/2018 108* 70 - 99 mg/dL Final  . BUN 09/26/2018 17  6 - 20 mg/dL Final  . Creatinine, Ser 09/26/2018 0.77  0.44 - 1.00 mg/dL Final  . Calcium 09/26/2018 9.5  8.9 - 10.3 mg/dL Final  . GFR calc non Af Amer 09/26/2018 >60  >60 mL/min Final  . GFR calc Af Amer  09/26/2018 >60  >60 mL/min Final  . Anion gap 09/26/2018 10  5 - 15 Final   Performed at [facility], [place] [address], [place], [place] [address]  . WBC 09/26/2018 7.6  4.0 - 10.5 K/uL Final  . RBC 09/26/2018 4.85  3.87 - 5.11 MIL/uL Final  .  Hemoglobin 09/26/2018 13.6  12.0 - 15.0 g/dL Final  . HCT 09/26/2018 42.4  36.0 - 46.0 % Final  . MCV 09/26/2018 87.4  80.0 - 100.0 fL Final  . MCH 09/26/2018 28.0  26.0 - 34.0 pg Final  . MCHC 09/26/2018 32.1  30.0 - 36.0 g/dL Final  . RDW 09/26/2018 13.3  11.5 - 15.5 % Final  . Platelets 09/26/2018 246  150 - 400 K/uL Final  . nRBC 09/26/2018 0.0  0.0 - 0.2 % Final   Performed at [facility], [place] [address], [place], [place] [address]  . ABO/RH(D) 09/26/2018 O NEG   Final  . Antibody Screen 09/26/2018 NEG   Final  . Sample Expiration 09/26/2018 10/10/2018   Final  . Extend sample reason 09/26/2018    Final                   Value:NO TRANSFUSIONS OR PREGNANCY IN THE PAST 3 MONTHS Performed at [facility], [place] [address], [place], [place] [address]   . MRSA, PCR 09/26/2018 NEGATIVE  NEGATIVE Final  . Staphylococcus aureus 09/26/2018 NEGATIVE  NEGATIVE Final   Comment: (NOTE) The Xpert SA Assay (FDA approved for NASAL specimens in patients 76 years of age and older), is one component of a comprehensive surveillance program. It is not intended to diagnose infection nor to guide or monitor treatment. Performed at [facility], [place] [address] [place], [place] [address]   . ABO/RH(D) 09/26/2018    Final                   Value:O NEG Performed at [facility], [place] [address] [place], [place] [address]   Abstract on 08/20/2018  Component Date Value Ref Range Status  . HM Mammogram 08/09/2017 0-4 Bi-Rad  0-4 Bi-Rad, Self Reported Normal Final     X-Rays:Dg Pelvis Portable  Result Date: 10/07/2018 CLINICAL DATA:  Initial evaluation status post  right total hip replacement. EXAM: PORTABLE PELVIS 1-2 VIEWS COMPARISON:  Prior study from earlier the same day. FINDINGS: A right total hip arthroplasty in place. Acetabular and femoral components appear well seated and articulate normally with 1 another. No complication or adverse features. Postoperative swelling with emphysema present about the right hip. IMPRESSION: Sequelae of interval right total hip arthroplasty without complication. Electronically Signed   By: Jeannine Boga M.D.   On: 10/07/2018 15:36   Dg C-arm 1-60 Min-no Report  Result Date: 10/07/2018 Fluoroscopy was utilized by the requesting physician.  No radiographic interpretation.   Dg Hip Operative Unilat W Or W/o Pelvis Right  Result Date: 10/07/2018 CLINICAL DATA:  54 year old female for right hip replacement. Subsequent encounter. EXAM: OPERATIVE right hip HIP (WITH PELVIS IF PERFORMED) 4 VIEWS TECHNIQUE: Fluoroscopic spot image(s) were submitted for interpretation post-operatively. Fluoroscopic time: 14 seconds. COMPARISON:  08/20/2018 FINDINGS: Four intraoperative C-arm views submitted for review after surgery. This reveals placement right hip prosthesis. The femoral head component is not yet placed. Portions visualized appear in satisfactory position on frontal imaging. Total prosthesis can be evaluated on follow-up imaging. IMPRESSION: Post right hip replacement as noted above. Electronically Signed   By: Genia Del M.D.   On: 10/07/2018 14:49    EKG: Orders placed or performed in visit on 05/09/16  . EKG 12-Lead     Hospital Course: Allison Compton is a 53 y.o. who was admitted to [facility]. They were brought to the operating room on 10/07/2018 and  underwent Procedure(s): TOTAL HIP ARTHROPLASTY ANTERIOR APPROACH.  Patient tolerated the procedure well and was later transferred to the recovery room and then to the orthopaedic floor for postoperative care. They were given PO and IV analgesics for pain  control following their surgery. They were given 24 hours of postoperative antibiotics of  Anti-infectives (From admission, onward)   Start     Dose/Rate Route Frequency Ordered Stop   10/07/18 1830  ceFAZolin (ANCEF) IVPB 2g/100 mL premix     2 g 200 mL/hr over 30 Minutes Intravenous Every 6 hours 10/07/18 1606 10/08/18 0023   10/07/18 1115  ceFAZolin (ANCEF) IVPB 2g/100 mL premix     2 g 200 mL/hr over 30 Minutes Intravenous On call to O.R. 10/07/18 1107 10/07/18 1253     and started on DVT prophylaxis in the form of Aspirin.   PT and OT were ordered for total joint protocol. Discharge planning consulted to help with postop disposition and equipment needs. Patient had an uneventful night on the evening of surgery. They started to get up OOB with therapy on POD #0. Continued to work with therapy into POD #1, with slow progression. Nursing reported episodes of dizziness with ambulation, and she had a BP of 107/46. She was given a NS bolus, and her symptoms improved. Pt was seen during rounds on day two and was ready for discharge pending progress with physical therapy. Dressing was C/DI. Patient  Pt worked with therapy for 2 additional sessions and was meeting their goals. She was discharged to home later that day in stable condition.  Diet: Regular diet Activity: WBAT Follow-up: in 2 weeks Disposition: Home Discharged Condition: good  Griffith Citron, PA-C Orthopedic Surgery [facility]      Discharge Instructions    Call MD / Call 911   Complete by:  As directed    If you experience chest pain or shortness of breath, CALL 911 and be transported to the hospital emergency room.  If you develope a fever above 101 F, pus (white drainage) or increased drainage or redness at the wound, or calf pain, call your surgeon's office.   Change dressing   Complete by:  As directed    Maintain surgical dressing until follow up in the clinic. If the edges start to pull up, may reinforce  with tape. If the dressing is no longer working, may remove and cover with gauze and tape, but must keep the area dry and clean.  Call with any questions or concerns.   Change dressing   Complete by:  As directed    Maintain surgical dressing until follow up in the clinic. If the edges start to pull up, may reinforce with tape. If the dressing is no longer working, may remove and cover with gauze and tape, but must keep the area dry and clean.  Call with any questions or concerns.   Constipation Prevention   Complete by:  As directed    Drink plenty of fluids.  Prune juice may be helpful.  You may use a stool softener, such as Colace (over the counter) 100 mg twice a day.  Use MiraLax (over the counter) for constipation as needed.   Diet - low sodium heart healthy   Complete by:  As directed    Discharge instructions   Complete by:  As directed    Maintain surgical dressing until follow up in the clinic. If the edges start to pull up, may reinforce with tape. If the dressing is  no longer working, may remove and cover with gauze and tape, but must keep the area dry and clean.  [facility] 2 [facility] at [facility]. Call with any questions or concerns.   Increase activity slowly as tolerated   Complete by:  As directed    Weight bearing as tolerated with assist device (walker, cane, etc) as directed, use it as long as suggested by your surgeon or therapist, typically at least 4-6 weeks.   TED hose   Complete by:  As directed    Use stockings (TED hose) for 2 weeks on both leg(s).  You may remove them at night for sleeping.   TED hose   Complete by:  As directed    Use stockings (TED hose) for 2 weeks on both leg(s).  You may remove them at night for sleeping.     Allergies as of 10/09/2018      Reactions   Adhesive [tape] Rash   Sometimes peels skin off   Latex Rash   Sometimes peels skin off   Nickel Rash      Medication List    STOP taking these medications     cyclobenzaprine 5 MG tablet Commonly known as:  FLEXERIL   ibuprofen 200 MG tablet Commonly known as:  ADVIL,MOTRIN   ibuprofen 600 MG tablet Commonly known as:  ADVIL,MOTRIN     TAKE these medications   aspirin 81 MG chewable tablet Chew 1 tablet (81 mg total) by mouth 2 (two) times daily for 28 days.   ferrous sulfate 325 (65 FE) MG tablet Take 1 tablet (325 mg total) by mouth 3 (three) times daily after meals for 28 days.   FISH OIL PO Take by mouth.   HYDROcodone-acetaminophen 7.5-325 MG tablet Commonly known as:  NORCO Take 1-2 tablets by mouth every 4 (four) hours as needed for severe pain (pain score 7-10).   methocarbamol 500 MG tablet Commonly known as:  ROBAXIN Take 1 tablet (500 mg total) by mouth every 6 (six) hours as needed for muscle spasms.   SELENIUM PO Take by mouth.   SYNTHROID 125 MCG tablet Generic drug:  levothyroxine TAKE 1 TABLET BY MOUTH  DAILY BEFORE BREAKFAST What changed:  See the new instructions.   VITAMIN D PO Take by mouth.   ZINC PO Take by mouth.            Discharge Care Instructions  (From admission, onward)         Start     Ordered   10/08/18 0000  Change dressing    Comments:  Maintain surgical dressing until follow up in the clinic. If the edges start to pull up, may reinforce with tape. If the dressing is no longer working, may remove and cover with gauze and tape, but must keep the area dry and clean.  Call with any questions or concerns.   10/08/18 0803   10/08/18 0000  Change dressing    Comments:  Maintain surgical dressing until follow up in the clinic. If the edges start to pull up, may reinforce with tape. If the dressing is no longer working, may remove and cover with gauze and tape, but must keep the area dry and clean.  Call with any questions or concerns.   10/08/18 0803         Follow-up Information    Paralee Cancel, MD. Schedule an appointment as soon as possible for a visit in 2 week(s).    Specialty:  Orthopedic Surgery  Contact information: [address] [place] [address] [phone]           Signed: Griffith Citron, PA-C Orthopedic Surgery 10/10/2018, 3:20 PM

## 2018-12-01 ENCOUNTER — Telehealth: Payer: Self-pay

## 2018-12-01 NOTE — Telephone Encounter (Signed)
Opened encounter in error  

## 2019-02-02 ENCOUNTER — Other Ambulatory Visit: Payer: Self-pay | Admitting: Internal Medicine

## 2019-02-27 ENCOUNTER — Other Ambulatory Visit: Payer: Self-pay | Admitting: Internal Medicine

## 2019-02-27 MED ORDER — SYNTHROID 125 MCG PO TABS: ORAL_TABLET | ORAL | 0 refills | Status: DC

## 2019-02-27 NOTE — Telephone Encounter (Signed)
Patient is in [place], [place] and forgot to pack

## 2019-02-27 NOTE — Telephone Encounter (Signed)
I can not find the [facility]  Please contact patient and have her supply the full pharmacy address and phone number. Thank you!

## 2019-02-27 NOTE — Telephone Encounter (Signed)
[facility] [address] [place] [address]

## 2019-02-27 NOTE — Telephone Encounter (Signed)
RX sent

## 2019-02-27 NOTE — Telephone Encounter (Signed)
Patient is in [place], [place] and forgot to pack her Synthroid. Patient requests a RX for 5 tablets of [facility]throid [facility] in  [place], [place] asap.

## 2019-03-24 ENCOUNTER — Telehealth: Payer: Self-pay | Admitting: Cardiovascular Disease

## 2019-03-24 NOTE — Telephone Encounter (Signed)
Recall -  lmtcb 03-24-19 °

## 2019-03-31 ENCOUNTER — Telehealth: Payer: Self-pay | Admitting: Internal Medicine

## 2019-03-31 NOTE — Telephone Encounter (Signed)
Patient stated that she received a letter from her insurance stating that SYNTHROID 125 MCG tablet is no longer cover. Patient stated that the alternative is levothyroxine and that did not work for her. She stated she would like an appeal to be giving to her insurance to cover the synthroid.   Please Advise, Thanks

## 2019-03-31 NOTE — Telephone Encounter (Signed)
[facility] fax # [phone]

## 2019-03-31 NOTE — Telephone Encounter (Signed)
Patient is requesting lab orders to be put in also.

## 2019-04-02 NOTE — Telephone Encounter (Signed)
PA request sent.

## 2019-04-03 NOTE — Telephone Encounter (Signed)
Please advise her to look into [facility] mail order pharmacy the out-of-pocket cost is not very high.  I have patients that need to pay $65 for 3 months.  If she agrees with this, we can send the prescription there.

## 2019-04-03 NOTE — Telephone Encounter (Signed)
Please advise on ordering labs at this time.

## 2019-04-06 ENCOUNTER — Telehealth: Payer: Self-pay

## 2019-04-06 NOTE — Telephone Encounter (Signed)
PA was sent for Synthroid 125 mcg, insurance canceled request because this is a covered medication.

## 2019-06-02 ENCOUNTER — Other Ambulatory Visit: Payer: Self-pay | Admitting: Internal Medicine

## 2019-07-14 ENCOUNTER — Telehealth: Payer: Self-pay

## 2019-07-14 DIAGNOSIS — E063 Autoimmune thyroiditis: Secondary | ICD-10-CM

## 2019-07-14 DIAGNOSIS — R7303 Prediabetes: Secondary | ICD-10-CM

## 2019-07-14 DIAGNOSIS — E785 Hyperlipidemia, unspecified: Secondary | ICD-10-CM

## 2019-07-14 NOTE — Telephone Encounter (Signed)
Noted  

## 2019-07-14 NOTE — Telephone Encounter (Signed)
Patient called wanting to [facility] if she could get labs added her thyroid, A1C, Cholesterol,    Please call and advise

## 2019-07-14 NOTE — Telephone Encounter (Signed)
Yes, will discuss at her visit in 3 days.

## 2019-07-15 ENCOUNTER — Other Ambulatory Visit: Payer: Self-pay

## 2019-07-15 NOTE — Telephone Encounter (Signed)
Please advise 

## 2019-07-15 NOTE — Telephone Encounter (Signed)
Patient is requesting her lab work e done before Friday so that she is able to discuss with Dr Cruzita Lederer either in person or by DOXY.  Patient states that she normally does her labs at [place].  Please call patient to advise at [phone]

## 2019-07-15 NOTE — Telephone Encounter (Signed)
Labs ordered.  Allison Brightly, do we need to fax these or make any change?

## 2019-07-15 NOTE — Telephone Encounter (Signed)
I usually prefer to talk to the patients before ordering labs, but if she absolutely wants to have this before the visit, can you please order a TSH, free T4, lipid panel and HbA1c?

## 2019-07-17 ENCOUNTER — Ambulatory Visit: Payer: 59 | Admitting: Internal Medicine

## 2019-07-17 ENCOUNTER — Other Ambulatory Visit: Payer: Self-pay

## 2019-07-17 ENCOUNTER — Encounter: Payer: Self-pay | Admitting: Internal Medicine

## 2019-07-17 VITALS — BP 130/80 | HR 70 | Ht 69.0 in | Wt 239.0 lb

## 2019-07-17 DIAGNOSIS — E785 Hyperlipidemia, unspecified: Secondary | ICD-10-CM

## 2019-07-17 DIAGNOSIS — E038 Other specified hypothyroidism: Secondary | ICD-10-CM | POA: Diagnosis not present

## 2019-07-17 DIAGNOSIS — Z23 Encounter for immunization: Secondary | ICD-10-CM | POA: Diagnosis not present

## 2019-07-17 DIAGNOSIS — E559 Vitamin D deficiency, unspecified: Secondary | ICD-10-CM | POA: Diagnosis not present

## 2019-07-17 DIAGNOSIS — R7303 Prediabetes: Secondary | ICD-10-CM | POA: Diagnosis not present

## 2019-07-17 DIAGNOSIS — E063 Autoimmune thyroiditis: Secondary | ICD-10-CM

## 2019-07-17 LAB — MICROALBUMIN / CREATININE URINE RATIO
Creatinine,U: 52.1 mg/dL
Microalb Creat Ratio: 1.3 mg/g (ref 0.0–30.0)
Microalb, Ur: <0.7 mg/dL (ref 0.0–1.9)

## 2019-07-17 LAB — HEMOGLOBIN A1C: Hgb A1c MFr Bld: 6.4 % (ref 4.6–6.5)

## 2019-07-17 LAB — VITAMIN D 25 HYDROXY (VIT D DEFICIENCY, FRACTURES): VITD: 27.13 ng/mL — ABNORMAL LOW (ref 30.00–100.00)

## 2019-07-17 LAB — TSH: TSH: 3.93 u[IU]/mL (ref 0.35–4.50)

## 2019-07-17 LAB — T4, FREE: Free T4: 0.99 ng/dL (ref 0.60–1.60)

## 2019-07-17 NOTE — Patient Instructions (Signed)
Please continue Synthroid 125 mcg daily.  Take the thyroid hormone every day, with water, at least 30 minutes before breakfast, separated by at least 4 hours from: - acid reflux medications - calcium - iron - multivitamins  Please stop at the lab.  Please come back for a follow-up appointment in 1 year.  

## 2019-07-17 NOTE — Progress Notes (Signed)
Patient ID: Allison Compton, female   DOB: [birth year], 53 y.o.   MRN: [id number]  This visit occurred during the SARS-CoV-2 public health emergency.  Safety protocols were in place, including screening questions prior to the visit, additional usage of staff PPE, and extensive cleaning of exam room while observing appropriate contact time as indicated for disinfecting solutions.   HPI  Allison Compton is a 53 y.o.-year-old female, returning for f/u for Hashimoto's hypothyroidism and prediabetes. Last visit 1 year and 1 month ago.  She had THR in 09/2018 >> feels much better.  She is working from home during the coronavirus pandemic.  She gained several pounds since last visit.  She is planning to start a diet.  Pt. was dx with hypothyroidism in 04/2015.  Pt is on Synthroid 125 mcg daily, taken: - missed 1-2 doses - in am - fasting - at least 30 min from b'fast - no Ca, Fe, MVI, PPIs - She is on biotin (B complex), but stopped before this visit - on magnesium, zinc  Reviewed her TFTs: Lab Results  Component Value Date   TSH 2.14 06/13/2018   TSH 3.57 01/07/2018   TSH 0.90 05/17/2017   TSH 0.81 11/20/2016   TSH 1.74 07/27/2016   TSH 6.17 (H) 05/28/2016   TSH 4.93 (H) 03/13/2016   TSH 7.92 (H) 12/19/2015   TSH 16.71 (H) 09/30/2015   TSH 7.90 (H) 05/30/2015   FREET4 1.04 06/13/2018   FREET4 0.99 01/07/2018   FREET4 1.5 05/17/2017   FREET4 1.28 11/20/2016   FREET4 1.29 07/27/2016   FREET4 1.02 05/28/2016   FREET4 0.92 03/13/2016   FREET4 0.90 12/19/2015   FREET4 0.88 09/30/2015   FREET4 0.75 05/30/2015    She has Hashimoto's thyroiditis: Component     Latest Ref Rng & Units 01/07/2018 06/13/2018  Thyroperoxidase Ab SerPl-aCnc     <9 IU/mL 96 (H) 139 (H)  Thyroglobulin Ab     < or = 1 IU/mL 1 <1   Previously: Component     Latest Ref Rng & Units 05/30/2015 05/28/2016 11/20/2016  Thyroperoxidase Ab SerPl-aCnc     <9 IU/mL 782 (H) 652 (H) 328 (H)  Thyroglobulin Ab     <2  IU/mL 2 (H) 1   She was on selenium 50 mcg grams daily >> stopped.  Pt denies: - feeling nodules in neck - hoarseness - dysphagia - choking - SOB with lying down  She has + FH of thyroid disorders in: several cousins. No FH of thyroid cancer. No h/o radiation tx to head or neck.  No herbal supplements. No recent steroids use. Took ABx for 1 day after a dental procedure.  Prediabetes: She is not on medicines for this  Reviewed HbA1c levels: Lab Results  Component Value Date   HGBA1C 6.0 (H) 09/26/2018   HGBA1C 6.3 06/13/2018   HGBA1C 6.4 01/07/2018   No CKD: Lab Results  Component Value Date   BUN 11 10/09/2018   Lab Results  Component Value Date   CREATININE 0.67 10/09/2018   No MAU: Lab Results  Component Value Date   MICRALBCREAT 21.5 09/29/2008   MICRALBCREAT 2.0 02/17/2008   MICRALBCREAT 3.9 05/29/2007   + HL: Lab Results  Component Value Date   CHOL 225 (H) 06/13/2018   HDL 52.30 06/13/2018   LDLCALC 148 (H) 06/13/2018   LDLDIRECT 139.9 02/17/2008   TRIG 126.0 06/13/2018   CHOLHDL 4 06/13/2018  She is not on a statin but is on fish oil.  At last visit I suggested a statin but she did not start it yet.  In the past, she was on a gluten and dairy free diet and her weight and TPO antibodies improved.  However, at last visit, she was on a keto diet.  We discussed that this may elevate her LDL.  Also, her TPO antibodies worsened.  She also has a history of hysterectomy in 2015.   She has a h/o vit D def.: Lab Results  Component Value Date   VD25OH 36 05/17/2017   VD25OH 48.22 11/20/2016   VD25OH 25.96 (L) 05/28/2016   She feels better after her vit D normalized.  She wanted to recheck her vitamin D level today.  Husband with [employer] >> uses a product called Birm.  ROS: Constitutional: + weight gain/no weight loss, no fatigue, no subjective hyperthermia, no subjective hypothermia Eyes: no blurry vision, no xerophthalmia ENT: no sore throat, + see  HPI Cardiovascular: no CP/no SOB/no palpitations/no leg swelling Respiratory: no cough/no SOB/no wheezing Gastrointestinal: no N/no V/no D/no C/no acid reflux Musculoskeletal: no muscle aches/no joint aches Skin: no rashes, no hair loss Neurological: no tremors/no numbness/no tingling/no dizziness  I reviewed pt's medications, allergies, PMH, social hx, family hx, and changes were documented in the history of present illness. Otherwise, unchanged from my initial visit note.  Past Medical History:  Diagnosis Date  . Anal tear   . 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 Eczema   . Edema of both legs   . Elevated blood pressure reading   . Fibroids   . Hashimoto's disease   . Hyperlipidemia   . Pre-diabetes    Pt states she is "borderline" for yrs.  . Seasonal affective disorder ([place])    PMH of   Past Surgical History:  Procedure Laterality Date  . ANAL FISTULOTOMY  2019  . CERVICAL BIOPSY  W/ LOOP ELECTRODE EXCISION  2002  . LAPAROSCOPIC ASSISTED VAGINAL HYSTERECTOMY N/A 02/10/2014   Procedure: LAPAROSCOPIC ASSISTED VAGINAL HYSTERECTOMY;  Surgeon: Allena Katz, MD;  Location: [facility];  Service: Gynecology;  Laterality: N/A;  . SALPINGOOPHORECTOMY Bilateral 02/10/2014   Procedure: SALPINGO OOPHORECTOMY;  Surgeon: Allena Katz, MD;  Location: [facility];  Service: Gynecology;  Laterality: Bilateral;  . TOTAL HIP ARTHROPLASTY Right 10/07/2018   Procedure: TOTAL HIP ARTHROPLASTY ANTERIOR APPROACH;  Surgeon: Paralee Cancel, MD;  Location: [facility];  Service: Orthopedics;  Laterality: Right;  70 mins   Social History   Social History  . Marital Status: Married    Spouse Name: N/A  . Number of Children: 1   Occupational History  . n/a   Social History Main Topics  . Smoking status: Former Smoker    Quit date: 08/13/1985  . Smokeless tobacco: Not on file     Comment: age [age], up to 1 ppd  . Alcohol Use: Yes     Comment: rarely once a month or less  . Drug  Use: No   Current Outpatient Medications on File Prior to Visit  Medication Sig Dispense Refill  . ferrous sulfate 325 (65 FE) MG tablet Take 1 tablet (325 mg total) by mouth 3 (three) times daily after meals for 28 days. 84 tablet 0  . HYDROcodone-acetaminophen (NORCO) 7.5-325 MG tablet Take 1-2 tablets by mouth every 4 (four) hours as needed for severe pain (pain score 7-10). 60 tablet 0  . methocarbamol (ROBAXIN) 500 MG tablet Take 1 tablet (500 mg total) by  mouth every 6 (six) hours as needed for muscle spasms. 40 tablet 0  . Multiple Vitamins-Minerals (ZINC PO) Take by mouth.    . Omega-3 Fatty Acids (FISH OIL PO) Take by mouth.    . SELENIUM PO Take by mouth.    . SYNTHROID 125 MCG tablet TAKE 1 TABLET BY MOUTH  DAILY BEFORE BREAKFAST 90 tablet 3  . VITAMIN D PO Take by mouth.     No current facility-administered medications on file prior to visit.    Allergies  Allergen Reactions  . Adhesive [Tape] Rash    Sometimes peels skin off  . Latex Rash    Sometimes peels skin off  . Nickel Rash   Family History  Problem Relation Age of Onset  . Diabetes Mother   . Asthma Mother   . Seasonal affective disorder Mother   . Clotting disorder Mother        excess platelets  . Heart disease Mother   . COPD Mother   . Kidney cancer Father   . Hypertension Father   . Hyperlipidemia Maternal Grandmother   . Stroke Maternal Grandmother 75       mini strokes  . Pancreatic cancer Paternal Grandmother   . Heart failure Paternal Grandmother   . Heart failure Maternal Grandfather        MI @ 99  . Alzheimer's disease Paternal Grandfather   . Seasonal affective disorder Sister   . GI problems Brother        GI bleed  . Heart disease Paternal Uncle    PE: BP 130/80   Pulse 70   Ht 5\' 9"  (1.753 m)   Wt 239 lb (108.4 kg)   LMP 02/06/2012   SpO2 96%   BMI 35.29 kg/m  Wt Readings from Last 3 Encounters:  07/17/19 239 lb (108.4 kg)  10/07/18 223 lb 3 oz (101.2 kg)  09/26/18 223 lb  3 oz (101.2 kg)   Constitutional: overweight, in NAD Eyes: PERRLA, EOMI, no exophthalmos ENT: moist mucous membranes, no thyromegaly, no cervical lymphadenopathy Cardiovascular: RRR, No MRG Respiratory: CTA B Gastrointestinal: abdomen soft, NT, ND, BS+ Musculoskeletal: no deformities, strength intact in all 4 Skin: moist, warm, no rashes Neurological: no tremor with outstretched hands, DTR normal in all 4  ASSESSMENT: 1. Hypothyroidism - Thyroid U/S (06/13/2015): Thyroid tissue is mildly heterogeneous but no focal nodules.  2. Prediabetes  3.  Hyperlipidemia  4. Vit D def.  PLAN:  1. Patient with Hashimoto's hypothyroidism on brand name Synthroid therapy - latest thyroid labs reviewed with pt >> normal: Lab Results  Component Value Date   TSH 2.14 06/13/2018   - she continues on Synthroid 125 mcg daily - pt feels good on this dose. - we discussed about taking the thyroid hormone every day, with water, >30 minutes before breakfast, separated by >4 hours from acid reflux medications, calcium, iron, multivitamins. Pt. is taking it correctly. - will check thyroid tests today: TSH and fT4 - If labs are abnormal, she will need to return for repeat TFTs in 1.5 months -Otherwise, I will see her back in a year  2. Prediabetes -Latest HbA1c level was reviewed and this was 6.0%, improved, but still in the prediabetic range -We will check another HbA1c today -She gained 16 pounds since last visit and this can worsen her insulin resistance and her HbA1c.  She is planning to start the diet.  I encouraged her to do that.  3.  Hyperlipidemia -Reviewed latest lipid panel  from 06/2018: LDL high, the rest of the fractions at goal: Lab Results  Component Value Date   CHOL 225 (H) 06/13/2018   HDL 52.30 06/13/2018   LDLCALC 148 (H) 06/13/2018   LDLDIRECT 139.9 02/17/2008   TRIG 126.0 06/13/2018   CHOLHDL 4 06/13/2018  -We will check a lipid panel today -She continues fish oil.  She is  not on a statin, but she agrees to start on if the LDL is still elevated.  4. Vit D def. - she restarted this last week - unclear what dose she is taking >> will look at home and let me know - will recheck today  She would prefer to have labs before the visit.  Component     Latest Ref Rng & Units 07/17/2019  Glucose     65 - 99 mg/dL 122 (H)  BUN     7 - 25 mg/dL 16  Creatinine     0.50 - 1.05 mg/dL 0.88  GFR, Est Non African American     > OR = 60 mL/min/1.4m2 74  GFR, Est African American     > OR = 60 mL/min/1.57m2 86  BUN/Creatinine Ratio     6 - 22 (calc) NOT APPLICABLE  Sodium     [id number] - 146 mmol/L 138  Potassium     3.5 - 5.3 mmol/L 4.3  Chloride     98 - 110 mmol/L 101  CO2     20 - 32 mmol/L 26  Calcium     8.6 - 10.4 mg/dL 9.3  Total Protein     6.1 - 8.1 g/dL 6.9  Albumin MSPROF     3.6 - 5.1 g/dL 4.3  Globulin     1.9 - 3.7 g/dL (calc) 2.6  AG Ratio     1.0 - 2.5 (calc) 1.7  Total Bilirubin     0.2 - 1.2 mg/dL 0.5  Alkaline phosphatase (APISO)     37 - 153 U/L 84  AST     10 - 35 U/L 22  ALT     6 - 29 U/L 32 (H)  Cholesterol, Total     100 - 199 mg/dL 263 (H)  Triglycerides     0 - 149 mg/dL 130  HDL Cholesterol     >39 mg/dL 58  VLDL Cholesterol Cal     5 - 40 mg/dL 23  LDL Chol Calc (NIH)     0 - 99 mg/dL 182 (H)  LDL/HDL Ratio     0.0 - 3.2 ratio 3.1  Microalb, Ur     0.0 - 1.9 mg/dL <0.7  Creatinine,U     mg/dL 52.1  MICROALB/CREAT RATIO     0.0 - 30.0 mg/g 1.3  TSH     0.35 - 4.50 uIU/mL 3.93  Hemoglobin A1C     4.6 - 6.5 % 6.4  T4,Free(Direct)     0.60 - 1.60 ng/dL 0.99  VITD     30.00 - 100.00 ng/mL 27.13 (L)   HbA1c is higher.  She will need to start checking sugars and make positive changes in her diet.  I will offer to refer her to nutrition. Her TSH level is normal. Vitamin D is still low.  Will need an increase in dose.  She will need to let me know what dose she is using now. She has a high LDL, at 182.  She will  need a statin. Her ALT is slightly high.  Possibly fatty liver.  I  will advise her to discuss with PCP.  Philemon Kingdom, MD yPhD [facility]

## 2019-07-18 LAB — COMPLETE METABOLIC PANEL WITH GFR
AG Ratio: 1.7 (calc) (ref 1.0–2.5)
ALT: 32 U/L — ABNORMAL HIGH (ref 6–29)
AST: 22 U/L (ref 10–35)
Albumin: 4.3 g/dL (ref 3.6–5.1)
Alkaline phosphatase (APISO): 84 U/L (ref 37–153)
BUN: 16 mg/dL (ref 7–25)
CO2: 26 mmol/L (ref 20–32)
Calcium: 9.3 mg/dL (ref 8.6–10.4)
Chloride: 101 mmol/L (ref 98–110)
Creat: 0.88 mg/dL (ref 0.50–1.05)
GFR, Est African American: 86 mL/min/{1.73_m2} (ref >=60)
GFR, Est Non African American: 74 mL/min/{1.73_m2} (ref >=60)
Globulin: 2.6 g/dL (calc) (ref 1.9–3.7)
Glucose, Bld: 122 mg/dL — ABNORMAL HIGH (ref 65–99)
Potassium: 4.3 mmol/L (ref 3.5–5.3)
Sodium: 138 mmol/L (ref 135–146)
Total Bilirubin: 0.5 mg/dL (ref 0.2–1.2)
Total Protein: 6.9 g/dL (ref 6.1–8.1)

## 2019-07-18 LAB — LIPID PANEL WITH LDL/HDL RATIO
Cholesterol, Total: 263 mg/dL — ABNORMAL HIGH (ref 100–199)
HDL: 58 mg/dL (ref >39)
LDL Chol Calc (NIH): 182 mg/dL — ABNORMAL HIGH (ref 0–99)
LDL/HDL Ratio: 3.1 ratio (ref 0.0–3.2)
Triglycerides: 130 mg/dL (ref 0–149)
VLDL Cholesterol Cal: 23 mg/dL (ref 5–40)

## 2019-07-18 LAB — SPECIMEN STATUS REPORT

## 2019-07-20 MED ORDER — ATORVASTATIN CALCIUM 20 MG PO TABS: 20.0000 mg | ORAL_TABLET | Freq: Every day | ORAL | 3 refills | Status: DC

## 2019-07-23 ENCOUNTER — Encounter: Payer: Self-pay | Admitting: Internal Medicine

## 2019-07-24 ENCOUNTER — Other Ambulatory Visit: Payer: Self-pay | Admitting: Internal Medicine

## 2019-07-24 DIAGNOSIS — R7303 Prediabetes: Secondary | ICD-10-CM

## 2019-07-24 DIAGNOSIS — E785 Hyperlipidemia, unspecified: Secondary | ICD-10-CM

## 2019-07-24 MED ORDER — ATORVASTATIN CALCIUM 20 MG PO TABS: 20.0000 mg | ORAL_TABLET | Freq: Every day | ORAL | 3 refills | Status: DC

## 2019-08-17 DIAGNOSIS — R87619 Unspecified abnormal cytological findings in specimens from cervix uteri: Secondary | ICD-10-CM | POA: Insufficient documentation

## 2019-08-17 DIAGNOSIS — K602 Anal fissure, unspecified: Secondary | ICD-10-CM | POA: Insufficient documentation

## 2019-08-17 DIAGNOSIS — N390 Urinary tract infection, site not specified: Secondary | ICD-10-CM | POA: Insufficient documentation

## 2019-09-02 ENCOUNTER — Encounter: Payer: Self-pay | Admitting: Registered"

## 2019-09-02 ENCOUNTER — Encounter: Payer: 59 | Attending: Internal Medicine | Admitting: Registered"

## 2019-09-02 ENCOUNTER — Other Ambulatory Visit: Payer: Self-pay

## 2019-09-02 DIAGNOSIS — R7303 Prediabetes: Secondary | ICD-10-CM | POA: Insufficient documentation

## 2019-09-02 NOTE — Patient Instructions (Addendum)
Instructions/Goals:  Make sure to get in three meals per day. Try to have balanced meals like the My Plate example (see handout). Include lean proteins, vegetables, fruits, and whole grains at meals.   -Recommend trying a nut milk with pea protein and one with ~30% calcium  -Include heart healthy unsaturated fats and limit those high in saturated fats (See handout)   -Recommend including gluten free grains and starches you feel are easier to have in moderation (See handout)  -Recommend asking your doctor about glucometer prescription to cover strips and lancets. Recommend checking fasting and may check 1 time 1-2 hours after a meal. Can call if you would like to discuss how to use your meter, etc.  -Will contact you about Diabetes Prevention Program starting next month.    Practice Mindful Eating  At meal and snack times, put away electronics (TV, phone, tablet, etc.) and try to eat seated at a table so you can better focus on eating your meal/snack and promote listening to your body's fullness and hunger signals.  If you feel that you are wanting to snack because your are bored or due to emotions and not because you are hungry, try to do a fun activity (read a book, take a walk, talk with a friend, etc.) for at least 20 minutes.   Make physical activity a part of your week. Try to include at least 30 minutes of physical activity 5 days each week or at least 150 minutes per week. Regular physical activity promotes overall health-including helping to reduce risk for heart disease and diabetes, promoting mental health, and helping [place] sleep better.

## 2019-09-02 NOTE — Progress Notes (Signed)
Medical Nutrition Therapy:  Appt start time: [id number] end time:  0955.  Assessment:  Primary concerns today: Pt referred for weight management, prediabetes. Pt present for appointment alone. Pt reports she has hashimoto's disease and has done a lot of research about autoimmune disorders. Reports years ago she did an elimination diet and found corn, gluten (as well as most grains) were not tolerated well. Reports since then she has been gluten free. Reports she was following an autoimmune diet similar to [employer] per pt and reports she was doing well until more recently over past year or so. Report this past year has been very difficult and when stressed food is her "go to." Reports working from home makes it more challenging as food is always near by. Reports she got an exercise bike for Christmas, but hasn't started it yet. Reports struggling with motivation. Reports she has a "horrific" diet. Reports she has started trying to eliminate things she does need in the home and mail order her food so it is already prepped. Reports she has been purchasing keto meals. Reports having a bad habit of snacking instead of eating real meals if she has to prepare her own meals. Reports she has continued gluten free diet well. Reports she felt the best when she was completely grain free in the past. Reports she doesn't do artificial sugars, sodas or cow's milk. Drinks water or tea and coconut milk. Reports only dairy she includes is butter when cooking and cheese. Uses olive, grapeseed, or avocado oils-no vegetable or canola, no corn syrup. Reports rice is tolerated but reports concerns about weight gain when including carbohydrates so she tries to avoid them. Pt reports she is an "all or nothing" person and it is easier for her to completely cut foods out of her diet.   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 Reports often sitting at her desk most of the time from 8 AM to 10 PM when she goes to sleep.   Pt  is interested in the Diabetes Prevention Program offered via grant with [employer] virtually. Pt was added to waitlist for group starting next month. Pt reports her doctor instructed her to start checking blood sugar. Pt has questions about when to check and blood sugar goals. She has not yet purchased a meter.   Food Allergies/Intolerances: Reports having symptoms of increased inflammation with gluten per pt report. Inflammation in joints, fatigue. Reports it is almost completley eliminated since going gluten-free.  GI Concerns: None reported.   Pertinent Lab Values: 07/17/2019 Vitamin D: 27.13 HgbA1c: 6.4 LDL Cholesterol: 182  Weight Hx: See chart.   Preferred Learning Style:   No preference indicated   Learning Readiness:   Ready  MEDICATIONS: Reviewed. See list.    DIETARY INTAKE:  Usual eating pattern includes 2 meals and a lot of snacking on and off. Reports she is never hungry at breakfast time. Reports usually doesn't get hungry until 11 or 12. Reports snacks most mid-day right after lunch and not a night eater.   Common foods: brussels sprouts, chicken.  Avoided foods: gluten containing grains and most all grains, artificial sweeteners, cow's milk, liver, soy, legumes (reports reducing due to concerns about autoimmune diet). Reports she doesn't eat fruit that often.   Typical Snacks: gluten free chips (corn or rice), cheese, nuts (almonds, pecans).   Typical Beverages: seltzer water, water, unsweetened decaf tea with coconut milk.  Location of Meals: at desk at home.  Electronics Present at [employer]: Yes:  computer-pt eats while working  24-hr recall:  B ( AM): None reported.  Snk ( AM): couple handfuls of coco dusted almonds  L (11 AM): buttered chicken, cauliflower rice, another vegetable unsure what it was,  Snk ( PM): gouda cheese  D ( PM): sausage with sauerkraut Snk ( PM): None reported. Reports she is not a night eater.  Beverages: 2 cups tea with coconut milk,  water x 3 bottles (larger than 16 oz) 2 bottles of seltzer water   Usual physical activity: walks dog 1-2 times daily   Minutes/Week: 15-20 minutes typically x 1-2 times daily  Progress Towards Goal(s):  In progress.   Nutritional Diagnosis:  NB-1.1 Food and nutrition-related knowledge deficit As related to heart healthy nutrition .  As evidenced by no prior heart healthy nutrition education by a dietitian reported; pt includes high fat coconut milk regularly in diet . NI-5.11.1 Predicted suboptimal nutrient intake As related to complex carbohydrates .  As evidenced by pt reports following keto diet.    Intervention:  Nutrition counseling provided. Dietitian discussed pt's labs. Dietitian provided education regarding relationship between prediabetes and dietary intake and heart healthy nutrition. Counseled regarding importance of balanced meals and need for all food groups. Provided education on gluten free whole grains. Discussed including brown rice and quinoa as main grains to start as they are more filling and nutritious as pt reports concern about wanting too much carbohydrates when she includes them in her diet. Counseled on mindful eating-discussed importance of eating at regular intervals to help prevent overeating later and cravings and discussed making list of non-food activities to do when bored or upset and wanting to snack. Discussed high saturated fat content of coconut milk and recommended pt try nut milk with added pea protein such as Silk Plus Protein which will provide more calcium and a much healthier fat profile. May recommend a calcium supplement in the future if needed. Discussed goals for checking fasting blood sugar and let pt know she can call or schedule time to go over using her meter if needed once she purchases one. Pt appeared agreeable to information/goals discussed.   Instructions/Goals:  Make sure to get in three meals per day. Try to have balanced meals like the My  Plate example (see handout). Include lean proteins, vegetables, fruits, and whole grains at meals.   -Recommend trying a nut milk with pea protein and one with ~30% calcium  -Include heart healthy unsaturated fats and limit those high in saturated fats (See handout)   -Recommend including gluten free grains and starches you feel are easier to have in moderation (See handout)  -Recommend asking your doctor about glucometer prescription to cover strips and lancets. Recommend checking fasting and may check 1 time 1-2 hours after a meal. Can call if you would like to discuss how to use your meter, etc.  -Will contact you about Diabetes Prevention Program starting next month.    Practice Mindful Eating  At meal and snack times, put away electronics (TV, phone, tablet, etc.) and try to eat seated at a table so you can better focus on eating your meal/snack and promote listening to your body's fullness and hunger signals.  If you feel that you are wanting to snack because your are bored or due to emotions and not because you are hungry, try to do a fun activity (read a book, take a walk, talk with a friend, etc.) for at least 20 minutes.   Make physical activity a part  of your week. Try to include at least 30 minutes of physical activity 5 days each week or at least 150 minutes per week. Regular physical activity promotes overall health-including helping to reduce risk for heart disease and diabetes, promoting mental health, and helping [place] sleep better.    Teaching Method Utilized:  Visual Auditory  Handouts given during visit include:  Balanced plate and food list.   Heart Healthy Nutrition   Barriers to learning/adherence to lifestyle change:   Demonstrated degree of understanding via:  Teach Back   Monitoring/Evaluation:  Dietary intake, exercise, and body weight in 3 month(s).

## 2019-10-12 ENCOUNTER — Other Ambulatory Visit: Payer: Self-pay

## 2019-10-12 DIAGNOSIS — E785 Hyperlipidemia, unspecified: Secondary | ICD-10-CM

## 2019-10-12 MED ORDER — ATORVASTATIN CALCIUM 20 MG PO TABS: 20.0000 mg | ORAL_TABLET | Freq: Every day | ORAL | 3 refills | Status: DC

## 2019-12-02 ENCOUNTER — Ambulatory Visit: Payer: 59 | Admitting: Registered"

## 2019-12-28 ENCOUNTER — Telehealth: Payer: Self-pay | Admitting: Internal Medicine

## 2019-12-28 MED ORDER — SYNTHROID 125 MCG PO TABS: 125.0000 ug | ORAL_TABLET | Freq: Every day | ORAL | 3 refills | Status: DC

## 2019-12-28 NOTE — Telephone Encounter (Signed)
Medication Refill Request  Did you call your pharmacy and request this refill first? Yes - pharmacy and insurance company told patient this would need authorization - she says it needs to be "urgent" - and says it needs to be over the phone. I did explain the process of a PA to the patient and said I did not see anything in her chart yet initiating this request.   . If patient has not contacted pharmacy first, instruct them to do so for future refills.  . Remind them that contacting the pharmacy for their refill is the quickest method to get the refill.  . Refill policy also stated that it will take anywhere between 24-72 hours to receive the refill.    Name of medication? synthroid  Is this a 90 day supply? yes  Name and location of pharmacy?  [facility]  [address], [place] [address]  Phone:  [phone] Fax:  [phone]

## 2019-12-28 NOTE — Telephone Encounter (Signed)
Please refer to phone note from 04/06/2019 when we were asked to submit a PA for her thyroid medication.  We took the time to send it in only for her insurance to turn right around and CANCEL the PA because her medication was covered and DID NOT require a PA.  This medication was last RX's October 2020. I will send in a refill.

## 2019-12-29 ENCOUNTER — Telehealth: Payer: Self-pay

## 2019-12-29 NOTE — Telephone Encounter (Signed)
Dr. Cruzita Lederer please advise,  Last year patient's insurance covered Synthroid DAW but this year they do require a PA.  PA was done yesterday and has been denied.  Per denial letter:  The requested medication and/or diagnosis are not a covered benefit and are excluded from coverage in accordance with the terms and conditions of your benefit plan.  This denial is based on your Synthroid drug coverage policy.

## 2019-12-29 NOTE — Telephone Encounter (Signed)
M, please let her know about the [facility] mail order pharmacy in [place], they offer a lower price Synthroid.  I have several patients that get it from there.  I believe, the price is around $75 for 3 months.  This is out-of-pocket.  The prescription needs to be sent with d.a.w.-1.

## 2019-12-31 MED ORDER — SYNTHROID 125 MCG PO TABS: 125.0000 ug | ORAL_TABLET | Freq: Every day | ORAL | 0 refills | Status: DC

## 2019-12-31 MED ORDER — SYNTHROID 125 MCG PO TABS: 125.0000 ug | ORAL_TABLET | Freq: Every day | ORAL | 3 refills | Status: DC

## 2019-12-31 NOTE — Telephone Encounter (Signed)
Patient called to ask about Synthroid - I read her the MyChart message regarding [facility].  Patient has requested that RX be sent there for her. Has also requested phone calls instead of My Chart message because she does use the app that frequently

## 2019-12-31 NOTE — Telephone Encounter (Signed)
Patient also requested a refill be sent locally to last her until [facility] can get her medication to her

## 2019-12-31 NOTE — Telephone Encounter (Signed)
RX sent to [facility] order and a 30 day to patient local pharmacy.  Patient notified.

## 2020-01-13 DIAGNOSIS — M7661 Achilles tendinitis, right leg: Secondary | ICD-10-CM | POA: Insufficient documentation

## 2020-03-07 ENCOUNTER — Telehealth: Payer: Self-pay | Admitting: Internal Medicine

## 2020-03-07 DIAGNOSIS — E559 Vitamin D deficiency, unspecified: Secondary | ICD-10-CM

## 2020-03-07 DIAGNOSIS — E063 Autoimmune thyroiditis: Secondary | ICD-10-CM

## 2020-03-07 DIAGNOSIS — E785 Hyperlipidemia, unspecified: Secondary | ICD-10-CM

## 2020-03-07 DIAGNOSIS — R7303 Prediabetes: Secondary | ICD-10-CM

## 2020-03-07 MED ORDER — ATORVASTATIN CALCIUM 20 MG PO TABS: 20.0000 mg | ORAL_TABLET | Freq: Every day | ORAL | 3 refills | Status: DC

## 2020-03-07 NOTE — Telephone Encounter (Signed)
Labs and refill have been ordered.

## 2020-03-07 NOTE — Telephone Encounter (Signed)
Patient called asking if Dr Cruzita Lederer could order some labs for her to have them done at [facility] to complete them. Patient requests to test her vitamin D, cholesterol, thyroid, and her A1C.  [facility] [place], [place] [address]  Patient also requests Rx refill to be called in for Atorvastatin for just a few weeks supply until she gets hers from [place] in the mail.  [id number] - [place], [place] - [address] AT [address] Phone:  [phone]  Fax:  [phone]

## 2020-03-11 ENCOUNTER — Other Ambulatory Visit: Payer: Self-pay

## 2020-03-11 ENCOUNTER — Other Ambulatory Visit: Payer: Self-pay | Admitting: Internal Medicine

## 2020-03-11 ENCOUNTER — Telehealth: Payer: Self-pay | Admitting: Internal Medicine

## 2020-03-11 DIAGNOSIS — E785 Hyperlipidemia, unspecified: Secondary | ICD-10-CM

## 2020-03-11 MED ORDER — ATORVASTATIN CALCIUM 20 MG PO TABS: 20.0000 mg | ORAL_TABLET | Freq: Every day | ORAL | 0 refills | Status: DC

## 2020-03-11 NOTE — Telephone Encounter (Signed)
Outpatient Medication Detail   Disp Refills Start End   atorvastatin (LIPITOR) 20 MG tablet 30 tablet 0 03/11/2020    Sig - Route: Take 1 tablet (20 mg total) by mouth daily. - Oral   Sent to pharmacy as: atorvastatin (LIPITOR) 20 MG tablet   E-Prescribing Status: Receipt confirmed by pharmacy (03/11/2020 10:09 AM EDT)

## 2020-03-11 NOTE — Telephone Encounter (Signed)
Patient has been unable to speak with [facility] re: RX for medication listed [place] and needs a RX for 30 day supply sent to the [facility] listed below: Medication Refill Request  Did you call your pharmacy and request this refill first? Unable . If patient has not contacted pharmacy first, instruct them to do so for future refills.  . Remind them that contacting the pharmacy for their refill is the quickest method to get the refill.  . Refill policy also stated that it will take anywhere between 24-72 hours to receive the refill.    Name of medication?  atorvastatin (LIPITOR) 20 MG tablet  Is this a 90 day supply? No-30 day  Name and location of pharmacy?  [facility] #[id number] - [place], [place] - [address] AT [address] Phone:  [phone]  Fax:  [phone]       . Is the request for diabetes test strips? No . If yes, what brand? N/A

## 2020-03-14 ENCOUNTER — Encounter: Payer: Self-pay | Admitting: Internal Medicine

## 2020-03-14 DIAGNOSIS — M6701 Short Achilles tendon (acquired), right ankle: Secondary | ICD-10-CM | POA: Insufficient documentation

## 2020-03-14 DIAGNOSIS — M7730 Calcaneal spur, unspecified foot: Secondary | ICD-10-CM | POA: Insufficient documentation

## 2020-03-24 ENCOUNTER — Telehealth: Payer: Self-pay

## 2020-03-24 NOTE — Telephone Encounter (Signed)
Received notes from [facility] placed in doctors office for review

## 2020-06-03 ENCOUNTER — Other Ambulatory Visit: Payer: Self-pay

## 2020-06-03 ENCOUNTER — Ambulatory Visit (INDEPENDENT_AMBULATORY_CARE_PROVIDER_SITE_OTHER): Payer: 59

## 2020-06-03 ENCOUNTER — Ambulatory Visit: Payer: 59

## 2020-06-03 DIAGNOSIS — R7303 Prediabetes: Secondary | ICD-10-CM | POA: Diagnosis not present

## 2020-06-03 DIAGNOSIS — E559 Vitamin D deficiency, unspecified: Secondary | ICD-10-CM

## 2020-06-03 DIAGNOSIS — E785 Hyperlipidemia, unspecified: Secondary | ICD-10-CM

## 2020-06-03 DIAGNOSIS — E063 Autoimmune thyroiditis: Secondary | ICD-10-CM

## 2020-06-03 DIAGNOSIS — E038 Other specified hypothyroidism: Secondary | ICD-10-CM

## 2020-06-03 LAB — TSH: TSH: 1.53 u[IU]/mL (ref 0.35–4.50)

## 2020-06-03 LAB — LIPID PANEL
Cholesterol: 237 mg/dL — ABNORMAL HIGH (ref 0–200)
HDL: 51.4 mg/dL (ref >39.00)
LDL Cholesterol: 160 mg/dL — ABNORMAL HIGH (ref 0–99)
NonHDL: 185.66
Total CHOL/HDL Ratio: 5
Triglycerides: 130 mg/dL (ref 0.0–149.0)
VLDL: 26 mg/dL (ref 0.0–40.0)

## 2020-06-03 LAB — VITAMIN D 25 HYDROXY (VIT D DEFICIENCY, FRACTURES): VITD: 26.99 ng/mL — ABNORMAL LOW (ref 30.00–100.00)

## 2020-06-03 LAB — T4, FREE: Free T4: 1.03 ng/dL (ref 0.60–1.60)

## 2020-06-03 LAB — HEMOGLOBIN A1C: Hgb A1c MFr Bld: 6.8 % — ABNORMAL HIGH (ref 4.6–6.5)

## 2020-06-13 ENCOUNTER — Telehealth: Payer: Self-pay | Admitting: Family Medicine

## 2020-06-13 NOTE — Telephone Encounter (Signed)
Got her 3rd covid vaccine and had a bad 'reaction' to it - now she has a 2-3 inch raised nodule above her left side of her collar bone. Is concerned and thinks she may need to have it looked at and not wait until her Thursday appt.

## 2020-06-13 NOTE — Telephone Encounter (Signed)
LVM for pt to CB for possible earlier appt.   Note: Pt can be seen at 345 today VV

## 2020-06-14 ENCOUNTER — Ambulatory Visit: Payer: 59 | Admitting: Family Medicine

## 2020-06-14 ENCOUNTER — Encounter: Payer: Self-pay | Admitting: Family Medicine

## 2020-06-14 ENCOUNTER — Other Ambulatory Visit: Payer: Self-pay

## 2020-06-14 VITALS — BP 106/72 | HR 75 | Temp 98.3°F | Ht 69.0 in | Wt 238.0 lb

## 2020-06-14 DIAGNOSIS — T50Z95A Adverse effect of other vaccines and biological substances, initial encounter: Secondary | ICD-10-CM

## 2020-06-14 DIAGNOSIS — L03114 Cellulitis of left upper limb: Secondary | ICD-10-CM

## 2020-06-14 MED ORDER — NAPROXEN 500 MG PO TABS: 500.0000 mg | ORAL_TABLET | Freq: Two times a day (BID) | ORAL | 0 refills | Status: DC

## 2020-06-14 NOTE — Progress Notes (Signed)
This visit occurred during the SARS-CoV-2 public health emergency.  Safety protocols were in place, including screening questions prior to the visit, additional usage of staff PPE, and extensive cleaning of exam room while observing appropriate contact time as indicated for disinfecting solutions.    Allison Compton , 09/14/1965, 54 y.o., female MRN: [id number] Patient Care Team    Relationship Specialty Notifications Start End  Ma Hillock, DO PCP - General Family Medicine  12/22/15   Philemon Kingdom, MD Consulting Physician Internal Medicine  12/23/15    Comment: prediabetes/thyroid    Chief Complaint  Patient presents with  . Cellulitis    pt c/o left arm swelling x 1 day; pt rec'd COVID vaccine 3 days ago; associated sx are swollen lymph nodes, warm, itching, and redness.      Subjective: Pt presents for an OV with complaints of reaction to moderna covid #3 shot > she received a full dose at [facility]. She is not considered immunocompromised by CDC criteria. She reports fever, fatigue the day of immunization and then redness and swelling at site and swelling left side of neck.    Depression screen [facility] 2/9 09/02/2019 06/14/2017 12/22/2015  Decreased Interest 0 0 0  Down, Depressed, Hopeless 0 0 0  PHQ - 2 Score 0 0 0    Allergies  Allergen Reactions  . Adhesive [Tape] Rash    Sometimes peels skin off  . Latex Rash    Sometimes peels skin off  . Nickel Rash   Social History   Social History Narrative   Married, "Allison" Johns Compton). 1 adult son Allison Compton).    BA degree, Tree surgeon.    Drinks caffeine occasionally, uses herbal remedies.   Wears her seatbelt, smoke detector in the home.   Firearms in the home, locked cabinet.   Feels safe in her relationships.         Epworth Sleepiness Scale = 6 (as of 05/09/2016)   Past Medical History:  Diagnosis Date  . Anal tear   . 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 Eczema   . Edema of  both legs   . Elevated blood pressure reading   . Fibroids   . Hashimoto's disease   . Hyperlipidemia   . Pre-diabetes    Pt states she is "borderline" for yrs.  . Seasonal affective disorder ([place])    PMH of   Past Surgical History:  Procedure Laterality Date  . ANAL FISTULOTOMY  2019  . CERVICAL BIOPSY  W/ LOOP ELECTRODE EXCISION  2002  . LAPAROSCOPIC ASSISTED VAGINAL HYSTERECTOMY N/A 02/10/2014   Procedure: LAPAROSCOPIC ASSISTED VAGINAL HYSTERECTOMY;  Surgeon: Allena Katz, MD;  Location: [facility];  Service: Gynecology;  Laterality: N/A;  . SALPINGOOPHORECTOMY Bilateral 02/10/2014   Procedure: SALPINGO OOPHORECTOMY;  Surgeon: Allena Katz, MD;  Location: [facility];  Service: Gynecology;  Laterality: Bilateral;  . TOTAL HIP ARTHROPLASTY Right 10/07/2018   Procedure: TOTAL HIP ARTHROPLASTY ANTERIOR APPROACH;  Surgeon: Paralee Cancel, MD;  Location: [facility];  Service: Orthopedics;  Laterality: Right;  70 mins   Family History  Problem Relation Age of Onset  . Diabetes Mother   . Asthma Mother   . Seasonal affective disorder Mother   . Clotting disorder Mother        excess platelets  . Heart disease Mother   . COPD Mother   . Cancer Mother   . Kidney cancer Father   .  Hypertension Father   . Cancer Father   . Hyperlipidemia Maternal Grandmother   . Stroke Maternal Grandmother 75       mini strokes  . Pancreatic cancer Paternal Grandmother   . Heart failure Paternal Grandmother   . Heart failure Maternal Grandfather        MI @ 21  . Alzheimer's disease Paternal Grandfather   . Seasonal affective disorder Sister   . GI problems Brother        GI bleed  . Heart disease Paternal Uncle    Allergies as of 06/14/2020      Reactions   Adhesive [tape] Rash   Sometimes peels skin off   Latex Rash   Sometimes peels skin off   Nickel Rash      Medication List       Accurate as of June 14, 2020 12:41 PM. If you have any questions, ask your nurse or doctor.          atorvastatin 20 MG tablet Commonly known as: LIPITOR TAKE 1 TABLET(20 MG) BY MOUTH DAILY   FISH OIL PO Take by mouth.   KRILL OIL PO Take by mouth.   naproxen 500 MG tablet Commonly known as: Naprosyn Take 1 tablet (500 mg total) by mouth 2 (two) times daily with a meal. Started by: Howard Pouch, DO   nystatin ointment Commonly known as: MYCOSTATIN Apply topically 2 (two) times daily.   Synthroid 125 MCG tablet Generic drug: levothyroxine Take 1 tablet (125 mcg total) by mouth daily before breakfast.   VITAMIN D PO Take by mouth.       All past medical history, surgical history, allergies, family history, immunizations andmedications were updated in the EMR today and reviewed under the history and medication portions of their EMR.     ROS: Negative, with the exception of above mentioned in HPI   Objective:  BP 106/72   Pulse 75   Temp 98.3 F (36.8 C) (Oral)   Ht 5\' 9"  (1.753 m)   Wt 238 lb (108 kg)   LMP 02/06/2012   SpO2 97%   BMI 35.15 kg/m  Body mass index is 35.15 kg/m. Gen: Afebrile. No acute distress. Nontoxic in appearance, well developed, well nourished.  HENT: AT. [place]. Bilateral TM visualized WNL. MMM Eyes:Pupils Equal Round Reactive to light, Extraocular movements intact,  Conjunctiva without redness, discharge or icterus. Neck/lymp/endocrine: Supple, NO axillary LAD. LAD left ant cervical. Swelling above left collar bone. Redness and swelling left deltoid at injection site. CV: RRR Chest: CTAB Skin: no rashes, purpura or petechiae.  Neuro:Normal gait. PERLA. EOMi. Alert. Oriented x3 Psych: Normal affect, dress and demeanor. Normal speech. Normal thought content and judgment.  No exam data present No results found. No results found for this or any previous visit (from the past 24 hour(s)).  Assessment/Plan: Allison Compton is a 54 y.o. female present for OV for  Vaccine reaction, initial encounter [name] received a Moderna full dose booster just  over 6 months from her completed moderna series (1/2 dose recommended for [facility]). She does not meet criteria of immunocompromised and is likely the cause she had a reaction to the immunization.  Ice. Naproxen BID with food for 3-5 days.  LAD can take up to 4-8 weeks to completely resolve. Continue to monitor and if does not continue to improve, or if worsens recommend her to be seen immediately.    Reviewed expectations re: course of current medical issues.  Discussed self-management of symptoms.  Outlined signs and symptoms indicating need for more acute intervention.  Patient verbalized understanding and all questions were answered.  Patient received an After-Visit Summary.    No orders of the defined types were placed in this encounter.  Meds ordered this encounter  Medications  . naproxen (NAPROSYN) 500 MG tablet    Sig: Take 1 tablet (500 mg total) by mouth 2 (two) times daily with a meal.    Dispense:  30 tablet    Refill:  0   Referral Orders  No referral(s) requested today     Note is dictated utilizing voice recognition software. Although note has been proof read prior to signing, occasional typographical errors still can be missed. If any questions arise, please do not hesitate to call for verification.   electronically signed by:  Howard Pouch, DO  [place]

## 2020-06-14 NOTE — Patient Instructions (Addendum)
Naproxen every 12 hours with food for 5 days.   How to Treat Normal Immunization Reactions Local Reactions at Injection Site:  . Cold Pack: For initial pain or swelling at the injection site, apply a cold pack to the area for 20  minutes each as needed. After 48 hours, if pain still present switch to warm compress or heating pad.   . Pain Medicine: acetaminophen (e.g., Tylenol) or ibuprofen (e.g., Advil) as long as patient is not allergic to the above or has been told to avoid one of the above secondary to liver or kidney disease. . Localized Hives: Apply 1% hydrocortisone cream (OTC) once or twice.  Fever: . For fevers above 102 F (39 C), acetaminophen or ibuprofen. If younger than 6 months, avoid  ibuprofen. Always give ibuprofen with food.  . For all fevers: extra fluids.  General Reactions:  Marland Kitchen Usually due to a sore injection site.  - Decreased appetite and activity level can also occur, as well as feeling fatigued . These symptoms do not need any treatment and will usually resolve in 24 to 72 hours.

## 2020-06-16 ENCOUNTER — Ambulatory Visit (INDEPENDENT_AMBULATORY_CARE_PROVIDER_SITE_OTHER): Payer: 59 | Admitting: Family Medicine

## 2020-06-16 ENCOUNTER — Encounter: Payer: Self-pay | Admitting: Family Medicine

## 2020-06-16 ENCOUNTER — Other Ambulatory Visit: Payer: Self-pay

## 2020-06-16 VITALS — BP 102/66 | HR 72 | Temp 97.9°F | Ht 69.0 in | Wt 241.0 lb

## 2020-06-16 DIAGNOSIS — R0789 Other chest pain: Secondary | ICD-10-CM | POA: Insufficient documentation

## 2020-06-16 DIAGNOSIS — Z8249 Family history of ischemic heart disease and other diseases of the circulatory system: Secondary | ICD-10-CM | POA: Diagnosis not present

## 2020-06-16 DIAGNOSIS — F321 Major depressive disorder, single episode, moderate: Secondary | ICD-10-CM | POA: Diagnosis not present

## 2020-06-16 MED ORDER — BUPROPION HCL ER (XL) 150 MG PO TB24: 150.0000 mg | ORAL_TABLET | Freq: Every day | ORAL | 1 refills | Status: DC

## 2020-06-16 NOTE — Patient Instructions (Addendum)
I have referred you to counseling they will call you with to schedule Start Wellbutrin daily. Follow up in 2.5 months, sooner if needed.   I will refer you to cardiology for evaluation on chest complaints.     Major Depressive Disorder, Adult Major depressive disorder (MDD) is a mental health condition. MDD often makes you feel sad, hopeless, or helpless. MDD can also cause symptoms in your body. MDD can affect your:  Work.  School.  Relationships.  Other normal activities. MDD can range from mild to very bad. It may occur once (single episode MDD). It can also occur many times (recurrent MDD). The main symptoms of MDD often include:  Feeling sad, depressed, or irritable most of the time.  Loss of interest. MDD symptoms also include:  Sleeping too much or too little.  Eating too much or too little.  A change in your weight.  Feeling tired (fatigue) or having low energy.  Feeling worthless.  Feeling guilty.  Trouble making decisions.  Trouble thinking clearly.  Thoughts of suicide or harming others.  Feeling weak.  Feeling agitated.  Keeping yourself from being around other people (isolation). Follow these instructions at home: Activity  Do these things as told by your doctor: ? Go back to your normal activities. ? Exercise regularly. ? Spend time outdoors. Alcohol  Talk with your doctor about how alcohol can affect your antidepressant medicines.  Do not drink alcohol. Or, limit how much alcohol you drink. ? This means no more than 1 drink a day for nonpregnant women and 2 drinks a day for men. One drink equals one of these:  12 oz of beer.  5 oz of wine.  1 oz of hard liquor. General instructions  Take over-the-counter and prescription medicines only as told by your doctor.  Eat a healthy diet.  Get plenty of sleep.  Find activities that you enjoy. Make time to do them.  Think about joining a support group. Your doctor may be able to  suggest a group for you.  Keep all follow-up visits as told by your doctor. This is important. Where to find more information:  [employer] on Mental Illness: ? www.nami.White: ? https://carter.com/  National Suicide Prevention Lifeline: ? 812-278-2502. This is free, 24-hour help. Contact a doctor if:  Your symptoms get worse.  You have new symptoms. Get help right away if:  You self-harm.  You see, hear, taste, smell, or feel things that are not present (hallucinate). If you ever feel like you may hurt yourself or others, or have thoughts about taking your own life, get help right away. You can go to your nearest emergency department or call:  Your local emergency services (911 in the [place]  A suicide crisis helpline, such as the National Suicide Prevention Lifeline: ? (709)014-0717. This is open 24 hours a day. This information is not intended to replace advice given to you by your health care provider. Make sure you discuss any questions you have with your health care provider. Document Revised: 07/12/2017 Document Reviewed: 04/15/2016 Elsevier Patient Education  2020 [employer].

## 2020-06-16 NOTE — Progress Notes (Signed)
This visit occurred during the SARS-CoV-2 public health emergency.  Safety protocols were in place, including screening questions prior to the visit, additional usage of staff PPE, and extensive cleaning of exam room while observing appropriate contact time as indicated for disinfecting solutions.    Allison Compton , 09/08/1965, 54 y.o., female MRN: [id number] Patient Care Team    Relationship Specialty Notifications Start End  Ma Hillock, DO PCP - General Family Medicine  12/22/15   Philemon Kingdom, MD Consulting Physician Internal Medicine  12/23/15    Comment: prediabetes/thyroid    Chief Complaint  Patient presents with  . Depression    pt has been feeling depressed for over 1.5 years; Due to different life changes including "watching my husband die" due to his cancer     Subjective: Pt presents for an OV with complaints of depression and weight gain of 1.5 years duration.  She is dealing with husband's terminal stage 4 prostate cancer, mother and sister relationship dynamica and work reorganized and she was moved into a new job she does not like. She reports she does not have a happy  Place in her life right now. Prior she had at least one place in her life that was good. She is binge eating and states she does not have the effort to care right now. It is one of her only pleasures left.  She also complains of chest discomfort mid sternum, sometimes radiates to left arm, sometimes mid back. She has a family h/o heart disease and stroke   Depression screen [facility] 2/9 06/16/2020 09/02/2019 06/14/2017 12/22/2015  Decreased Interest 3 0 0 0  Down, Depressed, Hopeless 3 0 0 0  PHQ - 2 Score 6 0 0 0  Altered sleeping 2 - - -  Tired, decreased energy 3 - - -  Change in appetite 3 - - -  Feeling bad or failure about yourself  3 - - -  Trouble concentrating 0 - - -  Moving slowly or fidgety/restless 0 - - -  Suicidal thoughts 0 - - -  PHQ-9 Score 17 - - -   GAD 7 : Generalized  Anxiety Score 06/16/2020  Nervous, Anxious, on Edge 0  Control/stop worrying 0  Worry too much - different things 0  Trouble relaxing 2  Restless 0  Easily annoyed or irritable 0  Afraid - awful might happen 0  Total GAD 7 Score 2      Allergies  Allergen Reactions  . Statins     mylagia  . Adhesive [Tape] Rash    Sometimes peels skin off  . Latex Rash    Sometimes peels skin off  . Nickel Rash   Social History   Social History Narrative   Married, "[place]" Stanley). 1 adult son [name]).    BA degree, Tree surgeon.    Drinks caffeine occasionally, uses herbal remedies.   Wears her seatbelt, smoke detector in the home.   Firearms in the home, locked cabinet.   Feels safe in her relationships.         Epworth Sleepiness Scale = 6 (as of 05/09/2016)   Past Medical History:  Diagnosis Date  . Anal tear   . 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 Eczema   . Edema of both legs   . Elevated blood pressure reading   . Fibroids   . Hashimoto's disease   . Hyperlipidemia   . Pre-diabetes  Pt states she is "borderline" for yrs.  . Seasonal affective disorder ([place])    PMH of   Past Surgical History:  Procedure Laterality Date  . ANAL FISTULOTOMY  2019  . CERVICAL BIOPSY  W/ LOOP ELECTRODE EXCISION  2002  . LAPAROSCOPIC ASSISTED VAGINAL HYSTERECTOMY N/A 02/10/2014   Procedure: LAPAROSCOPIC ASSISTED VAGINAL HYSTERECTOMY;  Surgeon: Allena Katz, MD;  Location: [facility];  Service: Gynecology;  Laterality: N/A;  . SALPINGOOPHORECTOMY Bilateral 02/10/2014   Procedure: SALPINGO OOPHORECTOMY;  Surgeon: Allena Katz, MD;  Location: [facility];  Service: Gynecology;  Laterality: Bilateral;  . TOTAL HIP ARTHROPLASTY Right 10/07/2018   Procedure: TOTAL HIP ARTHROPLASTY ANTERIOR APPROACH;  Surgeon: Paralee Cancel, MD;  Location: [facility];  Service: Orthopedics;  Laterality: Right;  70 mins   Family History  Problem Relation Age of Onset  . Diabetes Mother   .  Asthma Mother   . Seasonal affective disorder Mother   . Clotting disorder Mother        excess platelets  . COPD Mother   . Cancer Mother   . Hyperlipidemia Mother   . Stroke Mother   . Kidney cancer Father   . Hypertension Father   . Cancer Father        kidney  . Heart disease Father   . Hyperlipidemia Maternal Grandmother   . Stroke Maternal Grandmother 75       mini strokes  . Pancreatic cancer Paternal Grandmother   . Heart failure Paternal Grandmother   . Heart failure Maternal Grandfather        MI @ 58  . Alzheimer's disease Paternal Grandfather   . Seasonal affective disorder Sister   . GI problems Brother        GI bleed  . Heart disease Paternal Uncle    Allergies as of 06/16/2020      Reactions   Statins    mylagia   Adhesive [tape] Rash   Sometimes peels skin off   Latex Rash   Sometimes peels skin off   Nickel Rash      Medication List       Accurate as of June 16, 2020  8:45 AM. If you have any questions, ask your nurse or doctor.        atorvastatin 20 MG tablet Commonly known as: LIPITOR TAKE 1 TABLET(20 MG) BY MOUTH DAILY   buPROPion 150 MG 24 hr tablet Commonly known as: Wellbutrin XL Take 1 tablet (150 mg total) by mouth daily. Started by: Howard Pouch, DO   FISH OIL PO Take by mouth.   KRILL OIL PO Take by mouth.   naproxen 500 MG tablet Commonly known as: Naprosyn Take 1 tablet (500 mg total) by mouth 2 (two) times daily with a meal.   nystatin ointment Commonly known as: MYCOSTATIN Apply topically 2 (two) times daily.   Synthroid 125 MCG tablet Generic drug: levothyroxine Take 1 tablet (125 mcg total) by mouth daily before breakfast.   VITAMIN D PO Take by mouth.       All past medical history, surgical history, allergies, family history, immunizations andmedications were updated in the EMR today and reviewed under the history and medication portions of their EMR.     ROS: Negative, with the exception of above  mentioned in HPI   Objective:  BP 102/66   Pulse 72   Temp 97.9 F (36.6 C) (Oral)   Ht 5\' 9"  (1.753 m)   Wt 241 lb (109.3  kg)   LMP 02/06/2012   SpO2 97%   BMI 35.59 kg/m  Body mass index is 35.59 kg/m. Gen: Afebrile. No acute distress. Nontoxic in appearance, well developed, well nourished.  HENT: AT. [place]. No cough or hoarsness Eyes:Pupils Equal Round Reactive to light, Extraocular movements intact,  Conjunctiva without redness, discharge or icterus.  Neuro:  Normal gait. PERLA. EOMi. Alert. Oriented x3  Psych: Normal affect, dress and demeanor. Normal speech. Normal thought content and judgment.  No exam data present No results found. No results found for this or any previous visit (from the past 24 hour(s)).  Assessment/Plan: Allison Compton is a 54 y.o. female present for OV for  Depression, major, single episode, moderate ([place]) Discussed options with her today and she is agreeable to medication management and therapy.  - Ambulatory referral to Psychology - start buPROPion (WELLBUTRIN XL) 150 MG 24 hr tablet; Take 1 tablet (150 mg total) by mouth daily.  Dispense: 90 tablet; Refill: 1 F/u 2.5 months, sooner if needed.   Chest discomfort/Family history of heart disease Possibly related to stress level. Can not rule cardiac in nature. With her fhx she would benefit from cardiac evaluation and she agrees.  She has hyperlipidemia and recently had to stop her statin due to myalgia.  - Ambulatory referral to Cardiology    Reviewed expectations re: course of current medical issues.  Discussed self-management of symptoms.  Outlined signs and symptoms indicating need for more acute intervention.  Patient verbalized understanding and all questions were answered.  Patient received an After-Visit Summary.    Orders Placed This Encounter  Procedures  . Ambulatory referral to Psychology  . Ambulatory referral to Cardiology   Meds ordered this encounter  Medications  .  buPROPion (WELLBUTRIN XL) 150 MG 24 hr tablet    Sig: Take 1 tablet (150 mg total) by mouth daily.    Dispense:  90 tablet    Refill:  1    Referral Orders     Ambulatory referral to Psychology     Ambulatory referral to Cardiology   Note is dictated utilizing voice recognition software. Although note has been proof read prior to signing, occasional typographical errors still can be missed. If any questions arise, please do not hesitate to call for verification.   electronically signed by:  Howard Pouch, DO  [place]

## 2020-07-19 ENCOUNTER — Other Ambulatory Visit: Payer: Self-pay

## 2020-07-19 ENCOUNTER — Ambulatory Visit (INDEPENDENT_AMBULATORY_CARE_PROVIDER_SITE_OTHER): Payer: 59 | Admitting: Cardiology

## 2020-07-19 ENCOUNTER — Encounter: Payer: Self-pay | Admitting: Cardiology

## 2020-07-19 ENCOUNTER — Encounter: Payer: Self-pay | Admitting: Internal Medicine

## 2020-07-19 ENCOUNTER — Ambulatory Visit (INDEPENDENT_AMBULATORY_CARE_PROVIDER_SITE_OTHER): Payer: 59 | Admitting: Internal Medicine

## 2020-07-19 VITALS — BP 110/80 | HR 82 | Ht 69.0 in | Wt 241.0 lb

## 2020-07-19 VITALS — BP 144/76 | HR 77 | Ht 69.0 in | Wt 241.0 lb

## 2020-07-19 DIAGNOSIS — R079 Chest pain, unspecified: Secondary | ICD-10-CM

## 2020-07-19 DIAGNOSIS — E785 Hyperlipidemia, unspecified: Secondary | ICD-10-CM

## 2020-07-19 DIAGNOSIS — R7303 Prediabetes: Secondary | ICD-10-CM | POA: Diagnosis not present

## 2020-07-19 DIAGNOSIS — E559 Vitamin D deficiency, unspecified: Secondary | ICD-10-CM | POA: Diagnosis not present

## 2020-07-19 DIAGNOSIS — T466X5A Adverse effect of antihyperlipidemic and antiarteriosclerotic drugs, initial encounter: Secondary | ICD-10-CM

## 2020-07-19 DIAGNOSIS — E038 Other specified hypothyroidism: Secondary | ICD-10-CM | POA: Diagnosis not present

## 2020-07-19 DIAGNOSIS — G720 Drug-induced myopathy: Secondary | ICD-10-CM

## 2020-07-19 DIAGNOSIS — E063 Autoimmune thyroiditis: Secondary | ICD-10-CM

## 2020-07-19 MED ORDER — EZETIMIBE 10 MG PO TABS: 10.0000 mg | ORAL_TABLET | Freq: Every day | ORAL | 3 refills | Status: DC

## 2020-07-19 MED ORDER — METOPROLOL TARTRATE 100 MG PO TABS: ORAL_TABLET | ORAL | 0 refills | Status: DC

## 2020-07-19 NOTE — Progress Notes (Signed)
Patient ID: Allison Compton, female   DOB: Aug 21, 1965, 54 y.o.   MRN: [id number]  This visit occurred during the SARS-CoV-2 public health emergency.  Safety protocols were in place, including screening questions prior to the visit, additional usage of staff PPE, and extensive cleaning of exam room while observing appropriate contact time as indicated for disinfecting solutions.   HPI  Allison Compton is a 54 y.o.-year-old female, returning for f/u for Hashimoto's hypothyroidism and prediabetes. Last visit 1 year ago.  She continues to work from home - started at the beginning of the coronavirus pandemic.  She changed her position at work since last visit.  She does not enjoy this as much as the previous job.  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  She just started Wellbutrin for depression.  Pt. was dx with hypothyroidism in 04/2015.  She is on Synthroid d.a.w. 125 mcg daily: - in am - fasting - at least 30 min from b'fast - no calcium - no iron - no multivitamins - no PPIs - not on Biotin  Reviewed her TFTs: Lab Results  Component Value Date   TSH 1.53 06/03/2020   TSH 3.93 07/17/2019   TSH 2.14 06/13/2018   TSH 3.57 01/07/2018   TSH 0.90 05/17/2017   TSH 0.81 11/20/2016   TSH 1.74 07/27/2016   TSH 6.17 (H) 05/28/2016   TSH 4.93 (H) 03/13/2016   TSH 7.92 (H) 12/19/2015   FREET4 1.03 06/03/2020   FREET4 0.99 07/17/2019   FREET4 1.04 06/13/2018   FREET4 0.99 01/07/2018   FREET4 1.5 05/17/2017   FREET4 1.28 11/20/2016   FREET4 1.29 07/27/2016   FREET4 1.02 05/28/2016   FREET4 0.92 03/13/2016   FREET4 0.90 12/19/2015    She has Hashimoto's thyroiditis Component     Latest Ref Rng & Units 01/07/2018 06/13/2018  Thyroperoxidase Ab SerPl-aCnc     <9 IU/mL 96 (H) 139 (H)  Thyroglobulin Ab     < or = 1 IU/mL 1 <1   Previously: Component     Latest Ref Rng & Units 05/30/2015 05/28/2016 11/20/2016  Thyroperoxidase Ab SerPl-aCnc     <9 IU/mL 782 (H) 652  (H) 328 (H)  Thyroglobulin Ab     <2 IU/mL 2 (H) 1   Previously on low-dose selenium.  Pt denies: - feeling nodules in neck - hoarseness - dysphagia - choking - SOB with lying down  She has + FH of thyroid disorders in: several cousins. No FH of thyroid cancer. No h/o radiation tx to head or neck.  No herbal supplements. No Biotin use. No recent steroids use.   Prediabetes: Not on medication for this.  Reviewed HbA1c levels: Lab Results  Component Value Date   HGBA1C 6.8 (H) 06/03/2020   HGBA1C 6.4 07/17/2019   HGBA1C 6.0 (H) 09/26/2018   No CKD: Lab Results  Component Value Date   BUN 16 07/17/2019   Lab Results  Component Value Date   CREATININE 0.88 07/17/2019   No MAU: Lab Results  Component Value Date   MICRALBCREAT 1.3 07/17/2019   MICRALBCREAT 21.5 09/29/2008   MICRALBCREAT 2.0 02/17/2008   MICRALBCREAT 3.9 05/29/2007   + HL: Lab Results  Component Value Date   CHOL 237 (H) 06/03/2020   HDL 51.40 06/03/2020   LDLCALC 160 (H) 06/03/2020   LDLDIRECT 139.9 02/17/2008   TRIG 130.0 06/03/2020   CHOLHDL 5 06/03/2020  She is on fish oil. We tried to start Atorvastatin but she developed mm aches  and Achilles tendinitis and we stopped.  In the past, she was on a gluten and dairy free diet and her weight and TPO antibodies improved.  However, at last visit, she was on a keto diet.  We discussed that this may elevate her LDL.  Also, her TPO antibodies worsened.  She also has a history of hysterectomy in 2015.   She has a history of vitamin D deficiency: Lab Results  Component Value Date   VD25OH 26.99 (L) 06/03/2020   VD25OH 27.13 (L) 07/17/2019   VD25OH 36 05/17/2017   VD25OH 48.22 11/20/2016   VD25OH 25.96 (L) 05/28/2016   She is on a vitamin D supplement: 5000 units daily - started after the above results returned, previously not on a supplement.  She had THR in 09/2018.  Allison Compton >> uses a product called Birm.  ROS: Constitutional: +  weight gain/no weight loss, no fatigue, no subjective hyperthermia, no subjective hypothermia Eyes: no blurry vision, no xerophthalmia ENT: no sore throat, + see HPI Cardiovascular: no CP/no SOB/no palpitations/+ leg swelling (improved) Respiratory: no cough/no SOB/no wheezing Gastrointestinal: no N/no V/no D/no C/no acid reflux Musculoskeletal: no muscle aches/no joint aches Skin: no rashes, no hair loss Neurological: no tremors/no numbness/no tingling/no dizziness  I reviewed pt's medications, allergies, PMH, social hx, family hx, and changes were documented in the history of present illness. Otherwise, unchanged from my initial visit note.  Past Medical History:  Diagnosis Date  . Anal tear   . 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 Eczema   . Edema of both legs   . Elevated blood pressure reading   . Fibroids   . Hashimoto's disease   . Hyperlipidemia   . Pre-diabetes    Pt states she is "borderline" for yrs.  . Seasonal affective disorder ([place])    PMH of   Past Surgical History:  Procedure Laterality Date  . ANAL FISTULOTOMY  2019  . CERVICAL BIOPSY  W/ LOOP ELECTRODE EXCISION  2002  . LAPAROSCOPIC ASSISTED VAGINAL HYSTERECTOMY N/A 02/10/2014   Procedure: LAPAROSCOPIC ASSISTED VAGINAL HYSTERECTOMY;  Surgeon: Allena Katz, MD;  Location: [facility];  Service: Gynecology;  Laterality: N/A;  . SALPINGOOPHORECTOMY Bilateral 02/10/2014   Procedure: SALPINGO OOPHORECTOMY;  Surgeon: Allena Katz, MD;  Location: [facility];  Service: Gynecology;  Laterality: Bilateral;  . TOTAL HIP ARTHROPLASTY Right 10/07/2018   Procedure: TOTAL HIP ARTHROPLASTY ANTERIOR APPROACH;  Surgeon: Paralee Cancel, MD;  Location: [facility];  Service: Orthopedics;  Laterality: Right;  70 mins   Social History   Social History  . Marital Status: Married    Spouse Name: N/A  . Number of Children: 1   Occupational History  . n/a   Social History Main Topics  . Smoking status: Former  Smoker    Quit date: 08/13/1985  . Smokeless tobacco: Not on file     Comment: age [age], up to 1 ppd  . Alcohol Use: Yes     Comment: rarely once a month or less  . Drug Use: No   Current Outpatient Medications on File Prior to Visit  Medication Sig Dispense Refill  . atorvastatin (LIPITOR) 20 MG tablet TAKE 1 TABLET(20 MG) BY MOUTH DAILY 90 tablet 0  . buPROPion (WELLBUTRIN XL) 150 MG 24 hr tablet Take 1 tablet (150 mg total) by mouth daily. 90 tablet 1  . KRILL OIL PO Take by mouth.    . naproxen (NAPROSYN) 500  MG tablet Take 1 tablet (500 mg total) by mouth 2 (two) times daily with a meal. 30 tablet 0  . nystatin ointment (MYCOSTATIN) Apply topically 2 (two) times daily.    . Omega-3 Fatty Acids (FISH OIL PO) Take by mouth.    . SYNTHROID 125 MCG tablet Take 1 tablet (125 mcg total) by mouth daily before breakfast. 30 tablet 0  . VITAMIN D PO Take by mouth.     No current facility-administered medications on file prior to visit.   Allergies  Allergen Reactions  . Statins     mylagia  . Adhesive [Tape] Rash    Sometimes peels skin off  . Latex Rash    Sometimes peels skin off  . Nickel Rash   Family History  Problem Relation Age of Onset  . Diabetes Mother   . Asthma Mother   . Seasonal affective disorder Mother   . Clotting disorder Mother        excess platelets  . COPD Mother   . Cancer Mother   . Hyperlipidemia Mother   . Stroke Mother   . Kidney cancer Father   . Hypertension Father   . Cancer Father        kidney  . Heart disease Father   . Hyperlipidemia Maternal Grandmother   . Stroke Maternal Grandmother 75       mini strokes  . Pancreatic cancer Paternal Grandmother   . Heart failure Paternal Grandmother   . Heart failure Maternal Grandfather        MI @ 18  . Alzheimer's disease Paternal Grandfather   . Seasonal affective disorder Sister   . GI problems Brother        GI bleed  . Heart disease Paternal Uncle    PE: BP 110/80   Pulse 82   Ht  5\' 9"  (1.753 m)   Wt 241 lb (109.3 kg)   LMP 02/06/2012   SpO2 97%   BMI 35.59 kg/m  Wt Readings from Last 3 Encounters:  07/19/20 241 lb (109.3 kg)  06/16/20 241 lb (109.3 kg)  06/14/20 238 lb (108 kg)   Constitutional: overweight, in NAD Eyes: PERRLA, EOMI, no exophthalmos ENT: moist mucous membranes, no thyromegaly, no cervical lymphadenopathy Cardiovascular: RRR, No MRG Respiratory: CTA B Gastrointestinal: abdomen soft, NT, ND, BS+ Musculoskeletal: no deformities, strength intact in all 4 Skin: moist, warm, no rashes Neurological: no tremor with outstretched hands, DTR normal in all 4  ASSESSMENT: 1. Hypothyroidism - Thyroid U/S (06/13/2015): Thyroid tissue is mildly heterogeneous but no focal nodules.  2. Prediabetes  3.  Hyperlipidemia  4. Vit D def.  5. Statin-induced myopathy and tendonitis  PLAN:  1. Patient with Hashimoto's hypothyroidism, on brand-name Synthroid therapy (gets this from [facility] in [place]) - latest thyroid labs reviewed with pt >> normal: Lab Results  Component Value Date   TSH 1.53 06/03/2020   - she continues on Synthroid d.a.w. 125 mcg daily - pt feels good on this dose. - we discussed about taking the thyroid hormone every day, with water, >30 minutes before breakfast, separated by >4 hours from acid reflux medications, calcium, iron, multivitamins. Pt. is taking it correctly.  2. Prediabetes -Latest HbA1c was reviewed and this was higher, in diabetic range, at 6.8% 1.5 months ago. -At last visit, we discussed about the referral to nutrition, but she did not pursue this -She is planning to talk to a counselor to help her control her eating.  For now, she is  trying to eliminate sweets.  I also recommended to try to eliminate fatty foods to improve her insulin resistance. -We will repeat her HbA1c in 1.5 to 2 months. -I will see her back for another visit in 6 months.  3.  Hyperlipidemia -Reviewed latest lipid panel from 05/2019:  LDL still high, slightly decreased from before, the rest of the fractions at goal Lab Results  Component Value Date   CHOL 237 (H) 06/03/2020   HDL 51.40 06/03/2020   LDLCALC 160 (H) 06/03/2020   LDLDIRECT 139.9 02/17/2008   TRIG 130.0 06/03/2020   CHOLHDL 5 06/03/2020  -We tried to start a statin at last visit but she developed myalgias and Achilles tendonitis -she had to have physical therapy for this.  The pain continued for several months.  It resolved after stopping the statin. -At this visit, we discussed about trying ezetimibe and rechecking her cholesterol levels in 2 months.  If these are not improved, we can try Bempedoic acid if covered.  If not, she agrees with the referral to the lipid clinic. -She is seeing cardiology later today for left arm pain  4. Vit D def. -Continues on vitamin D supplementation, but she started only after the last level returned low 1.5 months ago -Continues 5000 units daily -We will recheck the level in 1.5-2 months  5. Statin-induced myopathy and tendonitis -Resolved after stopping atorvastatin -now off statins >> cannot restart  She would prefer to have labs before the visit.  Orders Placed This Encounter  Procedures  . Lipid panel  . VITAMIN D 25 Hydroxy (Vit-D Deficiency, Fractures)  . Hemoglobin A1c   Philemon Kingdom, MD PhD [facility] Endocrinology

## 2020-07-19 NOTE — Progress Notes (Signed)
Cardiology Office Note:    Date:  07/19/2020   ID:  Allison Compton, DOB Jun 14, 1966, MRN [id number]  PCP:  Ma Hillock, DO  Cardiologist:  No primary care provider on file.  Electrophysiologist:  None   Referring MD: Ma Hillock, DO   Chief Complaint  Patient presents with  . Chest Pain    History of Present Illness:    Allison Compton is a 54 y.o. female with a hx of hyperlipidemia, prediabetes who is referred by Dr. Raoul Pitch for evaluation of chest pain.  She reports she started having chest pain in the last few weeks.  Has occurred mainly at night.  Describes left-sided sharp pain, 6-7 out of 10 in intensity.  Also reports left shoulder/arm pain.  Can last for up to 30 minutes.  Occurs about every 2 days.  For exercise, she walks her dog, denies any chest pain while doing this.  Denies any shortness of breath.  Also reports he started having lightheadedness when she turns her head certain ways.  Denies any syncope.  Does report mild chronic lower extremity edema.  She also reports around the same time of her chest pain she started having issues with lightheadedness when she turns her head in certain directions.  Reports this has resolved.  Denies any syncope.  Reports chronic mild lower extremity edema.  Reports palpitations when she drinks caffeine, which she has stopped.  She smoked from age [age], none since that time.  Family history includes significant hyperlipidemia and mother has TIAs.  She reports that she took atorvastatin for 1 year but developed myalgias and Achilles tendinitis.  Recently was prescribed Zetia but has not started yet.    Past Medical History:  Diagnosis Date  . Anal tear   . 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 Eczema   . Edema of both legs   . Elevated blood pressure reading   . Fibroids   . Hashimoto's disease   . Hyperlipidemia   . Pre-diabetes    Pt states she is "borderline" for yrs.  . Seasonal affective disorder ([place])     PMH of    Past Surgical History:  Procedure Laterality Date  . ANAL FISTULOTOMY  2019  . CERVICAL BIOPSY  W/ LOOP ELECTRODE EXCISION  2002  . LAPAROSCOPIC ASSISTED VAGINAL HYSTERECTOMY N/A 02/10/2014   Procedure: LAPAROSCOPIC ASSISTED VAGINAL HYSTERECTOMY;  Surgeon: Allena Katz, MD;  Location: [facility];  Service: Gynecology;  Laterality: N/A;  . SALPINGOOPHORECTOMY Bilateral 02/10/2014   Procedure: SALPINGO OOPHORECTOMY;  Surgeon: Allena Katz, MD;  Location: [facility];  Service: Gynecology;  Laterality: Bilateral;  . TOTAL HIP ARTHROPLASTY Right 10/07/2018   Procedure: TOTAL HIP ARTHROPLASTY ANTERIOR APPROACH;  Surgeon: Paralee Cancel, MD;  Location: [facility];  Service: Orthopedics;  Laterality: Right;  70 mins    Current Medications: Current Meds  Medication Sig  . buPROPion (WELLBUTRIN XL) 150 MG 24 hr tablet Take 1 tablet (150 mg total) by mouth daily.  Marland Kitchen ezetimibe (ZETIA) 10 MG tablet Take 1 tablet (10 mg total) by mouth daily.  Marland Kitchen KRILL OIL PO Take by mouth.  . naproxen (NAPROSYN) 500 MG tablet Take 1 tablet (500 mg total) by mouth 2 (two) times daily with a meal.  . nystatin ointment (MYCOSTATIN) Apply topically 2 (two) times daily.  . Omega-3 Fatty Acids (FISH OIL PO) Take by mouth.  . SYNTHROID 125 MCG tablet Take 1 tablet (125 mcg  total) by mouth daily before breakfast.  . VITAMIN D PO Take by mouth.     Allergies:   Statins, Adhesive [tape], Latex, and Nickel   Social History   Socioeconomic History  . Marital status: Married    Spouse name: Not on file  . Number of children: Not on file  . Years of education: Not on file  . Highest education level: Not on file  Occupational History  . Not on file  Tobacco Use  . Smoking status: Former Smoker    Quit date: 08/13/1985    Years since quitting: 34.9  . Smokeless tobacco: Never Used  . Tobacco comment: age [age], up to 1 ppd  Vaping Use  . Vaping Use: Never used  Substance and Sexual Activity  . Alcohol use: Not  Currently  . Drug use: No  . Sexual activity: Never    Birth control/protection: Surgical  Other Topics Concern  . Not on file  Social History Narrative   Married, "[place]" [place]). 1 adult son [name]).    BA degree, Tree surgeon.    Drinks caffeine occasionally, uses herbal remedies.   Wears her seatbelt, smoke detector in the home.   Firearms in the home, locked cabinet.   Feels safe in her relationships.         Epworth Sleepiness Scale = 6 (as of 05/09/2016)   Social Determinants of Health   Financial Resource Strain:   . Difficulty of Paying Living Expenses: Not on file  Food Insecurity:   . Worried About Charity fundraiser in the Last Year: Not on file  . Ran Out of Food in the Last Year: Not on file  Transportation Needs:   . Lack of Transportation (Medical): Not on file  . Lack of Transportation (Non-Medical): Not on file  Physical Activity:   . Days of Exercise per Week: Not on file  . Minutes of Exercise per Session: Not on file  Stress:   . Feeling of Stress : Not on file  Social Connections:   . Frequency of Communication with Friends and Family: Not on file  . Frequency of Social Gatherings with Friends and Family: Not on file  . Attends Religious Services: Not on file  . Active Member of Clubs or Organizations: Not on file  . Attends Archivist Meetings: Not on file  . Marital Status: Not on file     Family History: The patient's family history includes Alzheimer's disease in her paternal grandfather; Asthma in her mother; COPD in her mother; Cancer in her father and mother; Clotting disorder in her mother; Diabetes in her mother; GI problems in her brother; Heart disease in her father and paternal uncle; Heart failure in her maternal grandfather and paternal grandmother; Hyperlipidemia in her maternal grandmother and mother; Hypertension in her father; Kidney cancer in her father; Pancreatic cancer in her paternal grandmother; Seasonal affective  disorder in her mother and sister; Stroke in her mother; Stroke (age of onset: 24) in her maternal grandmother.  ROS:   Please see the history of present illness.     All other systems reviewed and are negative.  EKGs/Labs/Other Studies Reviewed:    The following studies were reviewed today:   EKG:  EKG is ordered today.  The ekg ordered today demonstrates normal sinus rhythm, rate 77, nonspecific T wave flattening  Recent Labs: 06/03/2020: TSH 1.53  Recent Lipid Panel    Component Value Date/Time   CHOL 237 (H) 06/03/2020 0857  CHOL 263 (H) 07/17/2019 0000   TRIG 130.0 06/03/2020 0857   HDL 51.40 06/03/2020 0857   HDL 58 07/17/2019 0000   CHOLHDL 5 06/03/2020 0857   VLDL 26.0 06/03/2020 0857   LDLCALC 160 (H) 06/03/2020 0857   LDLCALC 182 (H) 07/17/2019 0000   LDLDIRECT 139.9 02/17/2008 0852    Physical Exam:    VS:  BP (!) 144/76   Pulse 77   Ht $R'5\' 9"'QM$  (1.753 m)   Wt 241 lb (109.3 kg)   LMP 02/06/2012   SpO2 98%   BMI 35.59 kg/m     Wt Readings from Last 3 Encounters:  07/19/20 241 lb (109.3 kg)  07/19/20 241 lb (109.3 kg)  06/16/20 241 lb (109.3 kg)     GEN: Well nourished, well developed in no acute distress HEENT: Normal NECK: No JVD; No carotid bruits LYMPHATICS: No lymphadenopathy CARDIAC: RRR, no murmurs, rubs, gallops RESPIRATORY:  Clear to auscultation without rales, wheezing or rhonchi  ABDOMEN: Soft, non-tender, non-distended MUSCULOSKELETAL:  No edema; No deformity  SKIN: Warm and dry NEUROLOGIC:  Alert and oriented x 3 PSYCHIATRIC:  Normal affect   ASSESSMENT:    1. Chest pain of uncertain etiology   2. Hyperlipidemia, unspecified hyperlipidemia type    PLAN:    Chest pain: Atypical in description but does have CAD risk factors (hyperlipidemia, T2DM).  Overall would classify as intermediate risk of obstructive CAD and warrants further evaluation to rule out ischemia -Coronary CTA.  Will give metoprolol 100 mg prior to his  study -Echocardiogram  Hyperlipidemia: Recently prescribed Zetia 10 mg daily but has not started taking.  Has been unable tolerate statins due to myalgias.  Recommended starting Zetia and will follow up results of coronary CTA to guide how aggressive to be in lowering cholesterol.  Most recent LDL 160 on 06/03/2020.  10-year ASCVD risk score 6.6%.  T2DM: A1c 6.8% on 06/03/2020.  Diet controlled.  RTC in 3   Medication Adjustments/Labs and Tests Ordered: Current medicines are reviewed at length with the patient today.  Concerns regarding medicines are outlined above.  Orders Placed This Encounter  Procedures  . CT CORONARY MORPH W/CTA COR W/SCORE W/CA W/CM &/OR WO/CM  . CT CORONARY FRACTIONAL FLOW RESERVE DATA PREP  . CT CORONARY FRACTIONAL FLOW RESERVE FLUID ANALYSIS  . EKG 12-Lead  . ECHOCARDIOGRAM COMPLETE   Meds ordered this encounter  Medications  . metoprolol tartrate (LOPRESSOR) 100 MG tablet    Sig: Take 100 mg (1 tablet) two hours prior to CT    Dispense:  1 tablet    Refill:  0    Patient Instructions  Medication Instructions:  Your physician recommends that you continue on your current medications as directed. Please refer to the Current Medication list given to you today.   Testing/Procedures: Coronary CTA-see instructions below  Your physician has requested that you have an echocardiogram. Echocardiography is a painless test that uses sound waves to create images of your heart. It provides your doctor with information about the size and shape of your heart and how well your heart's chambers and valves are working. This procedure takes approximately one hour. There are no restrictions for this procedure. This will be done at our [facility] location:  [place]: At [facility], you and your health needs are our priority.  As part of our continuing mission to provide you with exceptional heart care, we have created designated  Provider Care Teams.  These  Care Teams include your primary Cardiologist (physician) and Advanced Practice Providers (APPs -  Physician Assistants and Nurse Practitioners) who all work together to provide you with the care you need, when you need it.  We recommend signing up for the patient portal called "MyChart".  Sign up information is provided on this After Visit Summary.  MyChart is used to connect with patients for Virtual Visits (Telemedicine).  Patients are able to view lab/test results, encounter notes, upcoming appointments, etc.  Non-urgent messages can be sent to your provider as well.   To learn more about what you can do with MyChart, go to [email or web address].    Your next appointment:   3 month(s)  The format for your next appointment:   In Person  Provider:   Oswaldo Milian, MD   Other Instructions    Coronary CTA instructions:    Your cardiac CT will be scheduled at one of the below locations:   [facility] [address] [place], [place] [address] [phone]  [place] [address] [place], [place] [address] [phone]  If scheduled at [facility], please arrive at the [facility] main entrance of [facility] 30 minutes prior to test start time. Proceed to the [facility] Radiology Department (first floor) to check-in and test prep.  If scheduled at [facility], please arrive 15 mins early for check-in and test prep.  Please follow these instructions carefully (unless otherwise directed):  On the Night Before the Test: . Be sure to Drink plenty of water. . Do not consume any caffeinated/decaffeinated beverages or chocolate 12 hours prior to your test. . Do not take any antihistamines 12 hours prior to your test.  On the Day of the Test: . Drink plenty of water. Do not drink any water within one hour of the test. . Do not eat  any food 4 hours prior to the test. . You may take your regular medications prior to the test.  . Take 100 mg metoprolol (Lopressor) two hours prior to test. . FEMALES- please wear underwire-free bra if available      After the Test: . Drink plenty of water. . After receiving IV contrast, you may experience a mild flushed feeling. This is normal. . On occasion, you may experience a mild rash up to 24 hours after the test. This is not dangerous. If this occurs, you can take Benadryl 25 mg and increase your fluid intake. . If you experience trouble breathing, this can be serious. If it is severe call 911 IMMEDIATELY. If it is mild, please call our office. . If you take any of these medications: Glipizide/Metformin, Avandament, Glucavance, please do not take 48 hours after completing test unless otherwise instructed.   Once we have confirmed authorization from your insurance company, we will call you to set up a date and time for your test. Based on how quickly your insurance processes prior authorizations requests, please allow up to 4 weeks to be contacted for scheduling your Cardiac CT appointment. Be advised that routine Cardiac CT appointments could be scheduled as many as 8 weeks after your provider has ordered it.  For non-scheduling related questions, please contact the cardiac imaging nurse navigator should you have any questions/concerns: Marchia Bond, Cardiac Imaging Nurse Navigator Burley Saver, Interim Cardiac Imaging Nurse [place] and Vascular Services Direct Office Dial: [phone]   For scheduling needs, including cancellations and rescheduling, please  call [place], [phone].      Signed, Donato Heinz, MD  07/19/2020 11:43 AM    [place]

## 2020-07-19 NOTE — Patient Instructions (Signed)
Medication Instructions:  Your physician recommends that you continue on your current medications as directed. Please refer to the Current Medication list given to you today.   Testing/Procedures: Coronary CTA-see instructions below  Your physician has requested that you have an echocardiogram. Echocardiography is a painless test that uses sound waves to create images of your heart. It provides your doctor with information about the size and shape of your heart and how well your heart's chambers and valves are working. This procedure takes approximately one hour. There are no restrictions for this procedure. This will be done at our [facility] location:  [place]: At [facility], you and your health needs are our priority.  As part of our continuing mission to provide you with exceptional heart care, we have created designated Provider Care Teams.  These Care Teams include your primary Cardiologist (physician) and Advanced Practice Providers (APPs -  Physician Assistants and Nurse Practitioners) who all work together to provide you with the care you need, when you need it.  We recommend signing up for the patient portal called "MyChart".  Sign up information is provided on this After Visit Summary.  MyChart is used to connect with patients for Virtual Visits (Telemedicine).  Patients are able to view lab/test results, encounter notes, upcoming appointments, etc.  Non-urgent messages can be sent to your provider as well.   To learn more about what you can do with MyChart, go to [email or web address].    Your next appointment:   3 month(s)  The format for your next appointment:   In Person  Provider:   Oswaldo Milian, MD   Other Instructions    Coronary CTA instructions:    Your cardiac CT will be scheduled at one of the below locations:   [facility] [address] [place], [place] [address] [phone]  [place] [address] [place], [place] [address] [phone]  If scheduled at [facility], please arrive at the [facility] main entrance of [facility] 30 minutes prior to test start time. Proceed to the [facility] Radiology Department (first floor) to check-in and test prep.  If scheduled at [facility], please arrive 15 mins early for check-in and test prep.  Please follow these instructions carefully (unless otherwise directed):  On the Night Before the Test: . Be sure to Drink plenty of water. . Do not consume any caffeinated/decaffeinated beverages or chocolate 12 hours prior to your test. . Do not take any antihistamines 12 hours prior to your test.  On the Day of the Test: . Drink plenty of water. Do not drink any water within one hour of the test. . Do not eat any food 4 hours prior to the test. . You may take your regular medications prior to the test.  . Take 100 mg metoprolol (Lopressor) two hours prior to test. . FEMALES- please wear underwire-free bra if available      After the Test: . Drink plenty of water. . After receiving IV contrast, you may experience a mild flushed feeling. This is normal. . On occasion, you may experience a mild rash up to 24 hours after the test. This is not dangerous. If this occurs, you can take Benadryl 25 mg and increase your fluid intake. . If you experience trouble breathing, this can be serious. If it is severe call 911 IMMEDIATELY. If it is mild, please  call our office. . If you take any of these medications: Glipizide/Metformin, Avandament, Glucavance, please do not take 48 hours after completing test unless otherwise instructed.   Once we have confirmed authorization from your insurance company, we will call you to set up a date and time for your test. Based on how quickly your insurance processes prior authorizations  requests, please allow up to 4 weeks to be contacted for scheduling your Cardiac CT appointment. Be advised that routine Cardiac CT appointments could be scheduled as many as 8 weeks after your provider has ordered it.  For non-scheduling related questions, please contact the cardiac imaging nurse navigator should you have any questions/concerns: Marchia Bond, Cardiac Imaging Nurse Navigator Burley Saver, Interim Cardiac Imaging Nurse [place] and Vascular Services Direct Office Dial: [phone]   For scheduling needs, including cancellations and rescheduling, please call [place], [phone].

## 2020-07-19 NOTE — Patient Instructions (Addendum)
Please continue Synthroid 125 mcg daily.  Take the thyroid hormone every day, with water, at least 30 minutes before breakfast, separated by at least 4 hours from: - acid reflux medications - calcium - iron - multivitamins  Please continue vitamin 5000 units daily.  Please add Ezetimibe 10 mg daily.  Please come back for labs in ~1.5-2 months.  Please come back for a follow-up appointment in 6 months.

## 2020-08-11 ENCOUNTER — Other Ambulatory Visit: Payer: Self-pay

## 2020-08-11 ENCOUNTER — Ambulatory Visit (HOSPITAL_COMMUNITY): Payer: 59 | Attending: Cardiology

## 2020-08-11 DIAGNOSIS — R079 Chest pain, unspecified: Secondary | ICD-10-CM | POA: Diagnosis not present

## 2020-08-11 LAB — ECHOCARDIOGRAM COMPLETE
Area-P 1/2: 4.68 cm2
S' Lateral: 2.6 cm

## 2020-08-23 LAB — HM PAP SMEAR: HM Pap smear: NORMAL

## 2020-08-23 LAB — RESULTS CONSOLE HPV: CHL HPV: NEGATIVE

## 2020-08-24 ENCOUNTER — Other Ambulatory Visit: Payer: Self-pay | Admitting: Cardiology

## 2020-08-24 ENCOUNTER — Telehealth (HOSPITAL_COMMUNITY): Payer: Self-pay | Admitting: Emergency Medicine

## 2020-08-24 NOTE — Telephone Encounter (Signed)
This is Dr. Schumann's pt 

## 2020-08-24 NOTE — Telephone Encounter (Signed)
Reaching out to patient to offer assistance regarding upcoming cardiac imaging study; pt verbalizes understanding of appt date/time, parking situation and where to check in, pre-test NPO status and medications ordered, and verified current allergies; name and call back number provided for further questions should they arise Marchia Bond RN [facility] [phone] office [phone] cell   Pt states she took metoprolol prior to echo appt and will need refill of metop. I called pharm to refill rx and pt verbalized understanding to pick up new rx. Clarise Cruz

## 2020-08-26 ENCOUNTER — Ambulatory Visit (HOSPITAL_COMMUNITY)
Admission: RE | Admit: 2020-08-26 | Discharge: 2020-08-26 | Disposition: A | Discharge status: Home / Self Care | Payer: 59 | Source: Ambulatory Visit | Attending: Cardiology | Admitting: Cardiology

## 2020-08-26 ENCOUNTER — Other Ambulatory Visit: Payer: Self-pay

## 2020-08-26 DIAGNOSIS — R079 Chest pain, unspecified: Secondary | ICD-10-CM | POA: Insufficient documentation

## 2020-08-26 MED ORDER — IOHEXOL 350 MG/ML SOLN
80.0000 mL | Freq: Once | INTRAVENOUS | Status: AC | PRN
  Administered 2020-08-26: 80 mL via INTRAVENOUS

## 2020-08-26 MED ORDER — NITROGLYCERIN 0.4 MG SL SUBL
SUBLINGUAL_TABLET | SUBLINGUAL | Status: AC
  Filled 2020-08-26: qty 2

## 2020-08-26 MED ORDER — NITROGLYCERIN 0.4 MG SL SUBL
0.8000 mg | SUBLINGUAL_TABLET | Freq: Once | SUBLINGUAL | Status: AC
  Administered 2020-08-26: 0.8 mg via SUBLINGUAL

## 2020-08-26 NOTE — Progress Notes (Signed)
CT scan completed. Tolerated well. D/C home ambulatory, awake and alert. In no distress. 

## 2020-09-13 HISTORY — PX: APPENDECTOMY: SHX54

## 2020-09-14 ENCOUNTER — Telehealth: Payer: Self-pay | Admitting: Internal Medicine

## 2020-09-14 NOTE — Telephone Encounter (Signed)
Pt called to request refill on wellbutrin   [place], [place] [address], [address] Phone:  [phone]  Fax:  [phone]

## 2020-09-16 ENCOUNTER — Telehealth: Payer: Self-pay

## 2020-09-16 NOTE — Telephone Encounter (Signed)
Called and left a message for pt advising we can not refill Bupropion (Wellbutrin).

## 2020-09-16 NOTE — Telephone Encounter (Signed)
Pt sched for f/u. Pt is to have pharmacy send a request once need a refill. Pt can only have 30 d once requested

## 2020-09-16 NOTE — Telephone Encounter (Signed)
Patient requested refill. She states medication is doing very well.  She stated she was to schedule follow up appt to let her know.  Her husband is very ill, and not doing well at this time.  He has recently been moved to [address]  Patient is stating that right now or within the next month is just not convenient for her to schedule a visit.  buPROPion (WELLBUTRIN XL) 150 MG 24 hr tablet [[id number]]   If Dr. Raoul Pitch will approve a 71 d/s (because of cost savings) Please send to her mail order pharmacy [facility] --- please add to her preferred pharmacy   If only 30 d/s is approved - please send to [id number] - [place], [place]

## 2020-09-21 ENCOUNTER — Other Ambulatory Visit: Payer: 59

## 2020-09-24 DIAGNOSIS — K3580 Unspecified acute appendicitis: Secondary | ICD-10-CM

## 2020-09-24 HISTORY — DX: Unspecified acute appendicitis: K35.80

## 2020-10-04 ENCOUNTER — Ambulatory Visit: Payer: 59 | Admitting: Family Medicine

## 2020-10-04 ENCOUNTER — Other Ambulatory Visit: Payer: Self-pay

## 2020-10-04 ENCOUNTER — Encounter: Payer: Self-pay | Admitting: Family Medicine

## 2020-10-04 VITALS — BP 124/71 | HR 72 | Temp 97.9°F | Ht 69.0 in | Wt 232.0 lb

## 2020-10-04 DIAGNOSIS — R300 Dysuria: Secondary | ICD-10-CM | POA: Diagnosis not present

## 2020-10-04 LAB — POCT URINALYSIS DIPSTICK
Bilirubin, UA: NEGATIVE
Blood, UA: NEGATIVE
Glucose, UA: NEGATIVE
Ketones, UA: NEGATIVE
Leukocytes, UA: NEGATIVE
Nitrite, UA: NEGATIVE
Protein, UA: NEGATIVE
Spec Grav, UA: <=1.005 — AB (ref 1.010–1.025)
Urobilinogen, UA: 0.2 E.U./dL
pH, UA: 6 (ref 5.0–8.0)

## 2020-10-04 MED ORDER — CEPHALEXIN 500 MG PO CAPS: 500.0000 mg | ORAL_CAPSULE | Freq: Four times a day (QID) | ORAL | 0 refills | Status: DC

## 2020-10-04 NOTE — Patient Instructions (Signed)

## 2020-10-04 NOTE — Progress Notes (Signed)
This visit occurred during the SARS-CoV-2 public health emergency.  Safety protocols were in place, including screening questions prior to the visit, additional usage of staff PPE, and extensive cleaning of exam room while observing appropriate contact time as indicated for disinfecting solutions.    Allison Compton , 1965/08/16, 55 y.o., female MRN: [id number] Patient Care Team    Relationship Specialty Notifications Start End  Ma Hillock, DO PCP - General Family Medicine  12/22/15   Philemon Kingdom, MD Consulting Physician Internal Medicine  12/23/15    Comment: prediabetes/thyroid    Chief Complaint  Patient presents with  . Dysuria    Pt c/o itching during urination and nocturia x 1.5 weeks     Subjective: Pt presents for an OV with complaints of dysuria, vaginal itching and urinary frequency of 1.5 weeks duration.  Patient reports these symptoms that are typical of her urinary tract infections.  She had chronic urinary tract infections when she was younger.  She denies any fever, chills, nausea or vomit.  She denies any low back pain.,  Her husband passed away 10 days ago. H/o pansensitve e.coli uti in the past.   Depression screen [facility] 2/9 06/16/2020 09/02/2019 06/14/2017 12/22/2015  Decreased Interest 3 0 0 0  Down, Depressed, Hopeless 3 0 0 0  PHQ - 2 Score 6 0 0 0  Altered sleeping 2 - - -  Tired, decreased energy 3 - - -  Change in appetite 3 - - -  Feeling bad or failure about yourself  3 - - -  Trouble concentrating 0 - - -  Moving slowly or fidgety/restless 0 - - -  Suicidal thoughts 0 - - -  PHQ-9 Score 17 - - -    Allergies  Allergen Reactions  . Statins Other (See Comments)    Myalgia, Achilles tendonitis  . Adhesive [Tape] Rash    Sometimes peels skin off  . Latex Rash    Sometimes peels skin off  . Nickel Rash   Social History   Social History Narrative   Married, "[place]" [place]). 1 adult son [name]).    BA degree, Tree surgeon.    Drinks  caffeine occasionally, uses herbal remedies.   Wears her seatbelt, smoke detector in the home.   Firearms in the home, locked cabinet.   Feels safe in her relationships.         Epworth Sleepiness Scale = 6 (as of 05/09/2016)   Past Medical History:  Diagnosis Date  . Anal tear   . 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 Eczema   . Edema of both legs   . Elevated blood pressure reading   . Fibroids   . Hashimoto's disease   . Hyperlipidemia   . Pre-diabetes    Pt states she is "borderline" for yrs.  . Seasonal affective disorder ([place])    PMH of   Past Surgical History:  Procedure Laterality Date  . ANAL FISTULOTOMY  2019  . APPENDECTOMY  09/2020  . CERVICAL BIOPSY  W/ LOOP ELECTRODE EXCISION  2002  . LAPAROSCOPIC ASSISTED VAGINAL HYSTERECTOMY N/A 02/10/2014   Procedure: LAPAROSCOPIC ASSISTED VAGINAL HYSTERECTOMY;  Surgeon: Allena Katz, MD;  Location: [facility];  Service: Gynecology;  Laterality: N/A;  . SALPINGOOPHORECTOMY Bilateral 02/10/2014   Procedure: SALPINGO OOPHORECTOMY;  Surgeon: Allena Katz, MD;  Location: [facility];  Service: Gynecology;  Laterality: Bilateral;  . TOTAL HIP ARTHROPLASTY Right 10/07/2018  Procedure: TOTAL HIP ARTHROPLASTY ANTERIOR APPROACH;  Surgeon: Paralee Cancel, MD;  Location: [facility];  Service: Orthopedics;  Laterality: Right;  70 mins   Family History  Problem Relation Age of Onset  . Diabetes Mother   . Asthma Mother   . Seasonal affective disorder Mother   . Clotting disorder Mother        excess platelets  . COPD Mother   . Cancer Mother   . Hyperlipidemia Mother   . Stroke Mother   . Kidney cancer Father   . Hypertension Father   . Cancer Father        kidney  . Heart disease Father   . Hyperlipidemia Maternal Grandmother   . Stroke Maternal Grandmother 75       mini strokes  . Pancreatic cancer Paternal Grandmother   . Heart failure Paternal Grandmother   . Heart failure Maternal Grandfather        MI @  22  . Alzheimer's disease Paternal Grandfather   . Seasonal affective disorder Sister   . GI problems Brother        GI bleed  . Heart disease Paternal Uncle    Allergies as of 10/04/2020      Reactions   Statins Other (See Comments)   Myalgia, Achilles tendonitis   Adhesive [tape] Rash   Sometimes peels skin off   Latex Rash   Sometimes peels skin off   Nickel Rash      Medication List       Accurate as of October 04, 2020  4:14 PM. If you have any questions, ask your nurse or doctor.        STOP taking these medications   metoprolol tartrate 100 MG tablet Commonly known as: LOPRESSOR Stopped by: Howard Pouch, DO     TAKE these medications   buPROPion 150 MG 24 hr tablet Commonly known as: Wellbutrin XL Take 1 tablet (150 mg total) by mouth daily.   ezetimibe 10 MG tablet Commonly known as: Zetia Take 1 tablet (10 mg total) by mouth daily.   FISH OIL PO Take by mouth.   KRILL OIL PO Take by mouth.   naproxen 500 MG tablet Commonly known as: Naprosyn Take 1 tablet (500 mg total) by mouth 2 (two) times daily with a meal.   nystatin ointment Commonly known as: MYCOSTATIN Apply topically 2 (two) times daily.   Synthroid 125 MCG tablet Generic drug: levothyroxine Take 1 tablet (125 mcg total) by mouth daily before breakfast.   VITAMIN D PO Take by mouth.       All past medical history, surgical history, allergies, family history, immunizations andmedications were updated in the EMR today and reviewed under the history and medication portions of their EMR.     ROS: Negative, with the exception of above mentioned in HPI   Objective:  BP 124/71   Pulse 72   Temp 97.9 F (36.6 C) (Oral)   Ht 5\' 9"  (1.753 m)   Wt 232 lb (105.2 kg)   LMP 02/06/2012   SpO2 98%   BMI 34.26 kg/m  Body mass index is 34.26 kg/m. Gen: Afebrile. No acute distress. Nontoxic in appearance, well developed, well nourished.  HENT: AT. [place].  Eyes:Pupils Equal Round Reactive  to light, Extraocular movements intact,  Conjunctiva without redness, discharge or icterus. Chest: CTAB, no wheeze or crackles. Good air movement, normal resp effort.  Abd: Soft. NTND. BS present. no Masses palpated. No rebound or guarding.  MSK: no  cva tenderness Skin: no rashes, purpura or petechiae.  Neuro:  Normal gait. PERLA. EOMi. Alert. Oriented x3  Psych: Normal affect, dress and demeanor. Normal speech. Normal thought content and judgment.  No exam data present No results found. Results for orders placed or performed in visit on 10/04/20 (from the past 24 hour(s))  POCT Urinalysis Dipstick     Status: Abnormal   Collection Time: 10/04/20  4:10 PM  Result Value Ref Range   Color, UA yellow    Clarity, UA clear    Glucose, UA Negative Negative   Bilirubin, UA negative    Ketones, UA negative    Spec Grav, UA <=1.005 (A) 1.010 - 1.025   Blood, UA negative    pH, UA 6.0 5.0 - 8.0   Protein, UA Negative Negative   Urobilinogen, UA 0.2 0.2 or 1.0 E.U./dL   Nitrite, UA negative    Leukocytes, UA Negative Negative   Appearance     Odor      Assessment/Plan: Allison Compton is a 55 y.o. female present for OV for  Dysuria Rest, hydrate Start keflex prophylactic.  Urine culture sent.  - POCT Urinalysis Dipstick - f/u prn  Reviewed expectations re: course of current medical issues.  Discussed self-management of symptoms.  Outlined signs and symptoms indicating need for more acute intervention.  Patient verbalized understanding and all questions were answered.  Patient received an After-Visit Summary.    Orders Placed This Encounter  Procedures  . POCT Urinalysis Dipstick   No orders of the defined types were placed in this encounter.  Referral Orders  No referral(s) requested today     Note is dictated utilizing voice recognition software. Although note has been proof read prior to signing, occasional typographical errors still can be missed. If any questions  arise, please do not hesitate to call for verification.   electronically signed by:  Howard Pouch, DO  [place]

## 2020-10-05 ENCOUNTER — Other Ambulatory Visit: Payer: Self-pay | Admitting: Family Medicine

## 2020-10-05 LAB — URINALYSIS W MICROSCOPIC + REFLEX CULTURE
Bacteria, UA: NONE SEEN /HPF
Bilirubin Urine: NEGATIVE
Glucose, UA: NEGATIVE
Hgb urine dipstick: NEGATIVE
Hyaline Cast: NONE SEEN /LPF
Ketones, ur: NEGATIVE
Leukocyte Esterase: NEGATIVE
Nitrites, Initial: NEGATIVE
Protein, ur: NEGATIVE
Specific Gravity, Urine: 1.002 (ref 1.001–1.03)
Squamous Epithelial / HPF: NONE SEEN /HPF (ref <=5)
WBC, UA: NONE SEEN /HPF (ref 0–5)
pH: 5.5 (ref 5.0–8.0)

## 2020-10-05 LAB — NO CULTURE INDICATED

## 2020-10-05 MED ORDER — FLUCONAZOLE 150 MG PO TABS: 150.0000 mg | ORAL_TABLET | Freq: Once | ORAL | 0 refills | Status: AC

## 2020-10-13 ENCOUNTER — Ambulatory Visit (INDEPENDENT_AMBULATORY_CARE_PROVIDER_SITE_OTHER): Payer: 59 | Admitting: Family Medicine

## 2020-10-13 ENCOUNTER — Other Ambulatory Visit: Payer: Self-pay

## 2020-10-13 ENCOUNTER — Encounter: Payer: Self-pay | Admitting: Family Medicine

## 2020-10-13 VITALS — BP 96/56 | HR 62 | Temp 98.0°F | Ht 69.0 in | Wt 238.0 lb

## 2020-10-13 DIAGNOSIS — R232 Flushing: Secondary | ICD-10-CM

## 2020-10-13 DIAGNOSIS — R7303 Prediabetes: Secondary | ICD-10-CM

## 2020-10-13 DIAGNOSIS — F321 Major depressive disorder, single episode, moderate: Secondary | ICD-10-CM

## 2020-10-13 DIAGNOSIS — E782 Mixed hyperlipidemia: Secondary | ICD-10-CM | POA: Diagnosis not present

## 2020-10-13 DIAGNOSIS — E038 Other specified hypothyroidism: Secondary | ICD-10-CM

## 2020-10-13 DIAGNOSIS — E063 Autoimmune thyroiditis: Secondary | ICD-10-CM

## 2020-10-13 DIAGNOSIS — Z8249 Family history of ischemic heart disease and other diseases of the circulatory system: Secondary | ICD-10-CM

## 2020-10-13 DIAGNOSIS — E559 Vitamin D deficiency, unspecified: Secondary | ICD-10-CM

## 2020-10-13 LAB — CBC WITH DIFFERENTIAL/PLATELET
Basophils Absolute: 0.1 10*3/uL (ref 0.0–0.1)
Basophils Relative: 2.3 % (ref 0.0–3.0)
Eosinophils Absolute: 0.3 10*3/uL (ref 0.0–0.7)
Eosinophils Relative: 4.5 % (ref 0.0–5.0)
HCT: 41 % (ref 36.0–46.0)
Hemoglobin: 13.9 g/dL (ref 12.0–15.0)
Lymphocytes Relative: 23.3 % (ref 12.0–46.0)
Lymphs Abs: 1.5 10*3/uL (ref 0.7–4.0)
MCHC: 34 g/dL (ref 30.0–36.0)
MCV: 84.8 fl (ref 78.0–100.0)
Monocytes Absolute: 0.6 10*3/uL (ref 0.1–1.0)
Monocytes Relative: 9.5 % (ref 3.0–12.0)
Neutro Abs: 3.8 10*3/uL (ref 1.4–7.7)
Neutrophils Relative %: 60.4 % (ref 43.0–77.0)
Platelets: 253 10*3/uL (ref 150.0–400.0)
RBC: 4.83 Mil/uL (ref 3.87–5.11)
RDW: 13.9 % (ref 11.5–15.5)
WBC: 6.3 10*3/uL (ref 4.0–10.5)

## 2020-10-13 LAB — COMPREHENSIVE METABOLIC PANEL
ALT: 20 U/L (ref 0–35)
AST: 13 U/L (ref 0–37)
Albumin: 4.3 g/dL (ref 3.5–5.2)
Alkaline Phosphatase: 86 U/L (ref 39–117)
BUN: 11 mg/dL (ref 6–23)
CO2: 29 mEq/L (ref 19–32)
Calcium: 9.6 mg/dL (ref 8.4–10.5)
Chloride: 104 mEq/L (ref 96–112)
Creatinine, Ser: 0.82 mg/dL (ref 0.40–1.20)
GFR: 79.57 mL/min (ref >60.00)
Glucose, Bld: 132 mg/dL — ABNORMAL HIGH (ref 70–99)
Potassium: 4.5 mEq/L (ref 3.5–5.1)
Sodium: 138 mEq/L (ref 135–145)
Total Bilirubin: 0.6 mg/dL (ref 0.2–1.2)
Total Protein: 6.9 g/dL (ref 6.0–8.3)

## 2020-10-13 LAB — LIPID PANEL
Cholesterol: 206 mg/dL — ABNORMAL HIGH (ref 0–200)
HDL: 50.8 mg/dL (ref >39.00)
LDL Cholesterol: 120 mg/dL — ABNORMAL HIGH (ref 0–99)
NonHDL: 155.04
Total CHOL/HDL Ratio: 4
Triglycerides: 176 mg/dL — ABNORMAL HIGH (ref 0.0–149.0)
VLDL: 35.2 mg/dL (ref 0.0–40.0)

## 2020-10-13 LAB — HEMOGLOBIN A1C: Hgb A1c MFr Bld: 6.7 % — ABNORMAL HIGH (ref 4.6–6.5)

## 2020-10-13 LAB — VITAMIN D 25 HYDROXY (VIT D DEFICIENCY, FRACTURES): VITD: 30.06 ng/mL (ref 30.00–100.00)

## 2020-10-13 MED ORDER — BUPROPION HCL ER (XL) 150 MG PO TB24: 150.0000 mg | ORAL_TABLET | Freq: Every day | ORAL | 1 refills | Status: DC

## 2020-10-13 NOTE — Patient Instructions (Addendum)
Glad you are feeling better.  I have refilled your Wellbutrin to [facility].    We will call you all lab results and provide recommendations.     Preventing High Cholesterol Cholesterol is a white, waxy substance similar to fat that the human body needs to help build cells. The liver makes all the cholesterol that a person's body needs. Having high cholesterol (hypercholesterolemia) increases your risk for heart disease and stroke. Extra or excess cholesterol comes from the food that you eat. High cholesterol can often be prevented with diet and lifestyle changes. If you already have high cholesterol, you can control it with diet, lifestyle changes, and medicines. How can high cholesterol affect me? If you have high cholesterol, fatty deposits (plaques) may build up on the walls of your blood vessels. The blood vessels that carry blood away from your heart are called arteries. Plaques make the arteries narrower and stiffer. This in turn can:  Restrict or block blood flow and cause blood clots to form.  Increase your risk for heart attack and stroke. What can increase my risk for high cholesterol? This condition is more likely to develop in people who:  Eat foods that are high in saturated fat or cholesterol. Saturated fat is mostly found in foods that come from animal sources.  Are overweight.  Are not getting enough exercise.  Have a family history of high cholesterol (familial hypercholesterolemia). What actions can I take to prevent this? Nutrition  Eat less saturated fat.  Avoid trans fats (partially hydrogenated oils). These are often found in margarine and in some baked goods, fried foods, and snacks bought in packages.  Avoid precooked or cured meat, such as bacon, sausages, or meat loaves.  Avoid foods and drinks that have added sugars.  Eat more fruits, vegetables, and whole grains.  Choose healthy sources of protein, such as fish, poultry, lean cuts of red meat, beans,  peas, lentils, and nuts.  Choose healthy sources of fat, such as: ? Nuts. ? Vegetable oils, especially olive oil. ? Fish that have healthy fats, such as omega-3 fatty acids. These fish include mackerel or salmon.   Lifestyle  Lose weight if you are overweight. Maintaining a healthy body mass index (BMI) can help prevent or control high cholesterol. It can also lower your risk for diabetes and high blood pressure. Ask your health care provider to help you with a diet and exercise plan to lose weight safely.  Do not use any products that contain nicotine or tobacco, such as cigarettes, e-cigarettes, and chewing tobacco. If you need help quitting, ask your health care provider. Alcohol use  Do not drink alcohol if: ? Your health care provider tells you not to drink. ? You are pregnant, may be pregnant, or are planning to become pregnant.  If you drink alcohol: ? Limit how much you use to:  0-1 drink a day for women.  0-2 drinks a day for men. ? Be aware of how much alcohol is in your drink. In the [place] one drink equals one 12 oz bottle of beer (355 mL), one 5 oz glass of wine (148 mL), or one 1 oz glass of hard liquor (44 mL). Activity  Get enough exercise. Do exercises as told by your health care provider.  Each week, do at least 150 minutes of exercise that takes a medium level of effort (moderate-intensity exercise). This kind of exercise: ? Makes your heart beat faster while allowing you to still be able to talk. ? Can  be done in short sessions several times a day or longer sessions a few times a week. For example, on 5 days each week, you could walk fast or ride your bike 3 times a day for 10 minutes each time.   Medicines  Your health care provider may recommend medicines to help lower cholesterol. This may be a medicine to lower the amount of cholesterol that your liver makes. You may need medicine if: ? Diet and lifestyle changes have not lowered your cholesterol  enough. ? You have high cholesterol and other risk factors for heart disease or stroke.  Take over-the-counter and prescription medicines only as told by your health care provider. General information  Manage your risk factors for high cholesterol. Talk with your health care provider about all your risk factors and how to lower your risk.  Manage other conditions that you have, such as diabetes or high blood pressure (hypertension).  Have blood tests to check your cholesterol levels at regular points in time as told by your health care provider.  Keep all follow-up visits as told by your health care provider. This is important. Where to find more information  American Heart Association: www.heart.org  National Heart, Lung, and Blood Institute: https://wilson-eaton.com/ Summary  High cholesterol increases your risk for heart disease and stroke. By keeping your cholesterol level low, you can reduce your risk for these conditions.  High cholesterol can often be prevented with diet and lifestyle changes.  Work with your health care provider to manage your risk factors, and have your blood tested regularly. This information is not intended to replace advice given to you by your health care provider. Make sure you discuss any questions you have with your health care provider. Document Revised: 05/12/2019 Document Reviewed: 05/12/2019 Elsevier Patient Education  [place].

## 2020-10-13 NOTE — Progress Notes (Signed)
This visit occurred during the SARS-CoV-2 public health emergency.  Safety protocols were in place, including screening questions prior to the visit, additional usage of staff PPE, and extensive cleaning of exam room while observing appropriate contact time as indicated for disinfecting solutions.    Allison Compton , 12-02-1965, 55 y.o., female MRN: [id number] Patient Care Team    Relationship Specialty Notifications Start End  Ma Hillock, DO PCP - General Family Medicine  12/22/15   Philemon Kingdom, MD Consulting Physician Internal Medicine  12/23/15    Comment: prediabetes/thyroid    Chief Complaint  Patient presents with   Follow-up    [facility]; pt is fasting      Subjective: Allison Compton is a 55 y.o. Pt presents for an OV to follow-up on chronic medical conditions. Depression, major, single episode, moderate (HCC)/Hot flashes Patient reports she feels the Wellbutrin 150 mg daily is very helpful.  She denies any side effects.  Mixed hyperlipidemia/Family history of heart disease Patient reports she is tolerating Zetia 10 mg daily.  Hypothyroidism due to Hashimoto's thyroiditis Managed by endocrine.  Compliant with Synthroid 125 mcg daily  Prediabetes A1c 6.8.  She currently is not prescribed medications.  She is attempting to lose weight and exercise.  Vitamin D deficiency Reports she has not been consistent with taking her vitamin D supplement  Cardiac CT 08/2020: IMPRESSION: 1. Coronary calcium score of 0. 2. Normal coronary origin with right dominance. 3. Nonobstructive CAD with noncalcified plaque in the proximal LAD causing mild (25-49%) stenosis Depression screen [facility] 2/9 06/16/2020 09/02/2019 06/14/2017 12/22/2015  Decreased Interest 3 0 0 0  Down, Depressed, Hopeless 3 0 0 0  PHQ - 2 Score 6 0 0 0  Altered sleeping 2 - - -  Tired, decreased energy 3 - - -  Change in appetite 3 - - -  Feeling bad or failure about yourself  3 - - -  Trouble concentrating 0  - - -  Moving slowly or fidgety/restless 0 - - -  Suicidal thoughts 0 - - -  PHQ-9 Score 17 - - -   GAD 7 : Generalized Anxiety Score 06/16/2020  Nervous, Anxious, on Edge 0  Control/stop worrying 0  Worry too much - different things 0  Trouble relaxing 2  Restless 0  Easily annoyed or irritable 0  Afraid - awful might happen 0  Total GAD 7 Score 2    Allergies  Allergen Reactions   Statins Other (See Comments)    Myalgia, Achilles tendonitis   Adhesive [Tape] Rash    Sometimes peels skin off   Latex Rash    Sometimes peels skin off   Nickel Rash   Social History   Social History Narrative   Married, "[place]" [place]). 1 adult son Allison Compton).    BA degree, Tree surgeon.    Drinks caffeine occasionally, uses herbal remedies.   Wears her seatbelt, smoke detector in the home.   Firearms in the home, locked cabinet.   Feels safe in her relationships.         Epworth Sleepiness Scale = 6 (as of 05/09/2016)   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   Eczema    Edema of both legs    Elevated blood pressure reading    Fibroids    Hashimoto's disease    Hyperlipidemia  Pre-diabetes    Pt states she is "borderline" for yrs.   Seasonal affective disorder [facility])    PMH of   Past Surgical History:  Procedure Laterality Date   ANAL FISTULOTOMY  2019   APPENDECTOMY  09/2020   CERVICAL BIOPSY  W/ LOOP ELECTRODE EXCISION  2002   LAPAROSCOPIC ASSISTED VAGINAL HYSTERECTOMY N/A 02/10/2014   Procedure: LAPAROSCOPIC ASSISTED VAGINAL HYSTERECTOMY;  Surgeon: Allena Katz, MD;  Location: [facility];  Service: Gynecology;  Laterality: N/A;   SALPINGOOPHORECTOMY Bilateral 02/10/2014   Procedure: SALPINGO OOPHORECTOMY;  Surgeon: Allena Katz, MD;  Location: [facility];  Service: Gynecology;  Laterality: Bilateral;   TOTAL HIP ARTHROPLASTY Right 10/07/2018    Procedure: TOTAL HIP ARTHROPLASTY ANTERIOR APPROACH;  Surgeon: Paralee Cancel, MD;  Location: [facility];  Service: Orthopedics;  Laterality: Right;  59 mins   Family History  Problem Relation Age of Onset   Diabetes Mother    Asthma Mother    Seasonal affective disorder Mother    Clotting disorder Mother        excess platelets   COPD Mother    Cancer Mother    Hyperlipidemia Mother    Stroke Mother    Kidney cancer Father    Hypertension Father    Cancer Father        kidney   Heart disease Father    Hyperlipidemia Maternal Grandmother    Stroke Maternal Grandmother 35       mini strokes   Pancreatic cancer Paternal Grandmother    Heart failure Paternal Grandmother    Heart failure Maternal Grandfather        MI @ 41   Alzheimer's disease Paternal Grandfather    Seasonal affective disorder Sister    GI problems Brother        GI bleed   Heart disease Paternal Uncle    Allergies as of 10/13/2020      Reactions   Statins Other (See Comments)   Myalgia, Achilles tendonitis   Adhesive [tape] Rash   Sometimes peels skin off   Latex Rash   Sometimes peels skin off   Nickel Rash      Medication List       Accurate as of October 13, 2020  3:22 PM. If you have any questions, ask your nurse or doctor.        STOP taking these medications   cephALEXin 500 MG capsule Commonly known as: KEFLEX Stopped by: Howard Pouch, DO   FISH OIL PO Stopped by: Howard Pouch, DO   fluconazole 150 MG tablet Commonly known as: DIFLUCAN Stopped by: Howard Pouch, DO   KRILL OIL PO Stopped by: Howard Pouch, DO   naproxen 500 MG tablet Commonly known as: Naprosyn Stopped by: Howard Pouch, DO   nystatin ointment Commonly known as: MYCOSTATIN Stopped by: Howard Pouch, DO   VITAMIN D PO Stopped by: Howard Pouch, DO     TAKE these medications   buPROPion 150 MG 24 hr tablet Commonly known as: Wellbutrin XL Take 1 tablet (150 mg total) by mouth daily.    ezetimibe 10 MG tablet Commonly known as: Zetia Take 1 tablet (10 mg total) by mouth daily.   Synthroid 125 MCG tablet Generic drug: levothyroxine Take 1 tablet (125 mcg total) by mouth daily before breakfast.       All past medical history, surgical history, allergies, family history, immunizations andmedications were updated in the EMR today and reviewed under the history and medication portions  of their EMR.     ROS: Negative, with the exception of above mentioned in HPI   Objective:  BP (!) 96/56    Pulse 62    Temp 98 F (36.7 C) (Oral)    Ht _0  (1.753 m)    Wt 238 lb (108 kg)    LMP 02/06/2012    SpO2 98%    BMI 35.15 kg/m  Body mass index is 35.15 kg/m. Gen: Afebrile. No acute distress. Nontoxic in appearance, well developed, well nourished.  HENT: AT. [place]. No cough or hoarsness Eyes:Pupils Equal Round Reactive to light, Extraocular movements intact,  Conjunctiva without redness, discharge or icterus.  Neuro:  Normal gait. PERLA. EOMi. Alert. Oriented x3  Psych: Normal affect, dress and demeanor. Normal speech. Normal thought content and judgment.  No exam data present No results found. No results found for this or any previous visit (from the past 24 hour(s)).  Assessment/Plan: Allison Compton is a 55 y.o. female present for OV for [facility] Depression, major, single episode, moderate (HCC)/Hot flashes Stable. Continue Wellbutrin 150 mg daily Follow-up 5.5 months, sooner if needed  Mixed hyperlipidemia/Family history of heart disease Patient is tolerating Zetia. - CBC w/Diff - Comp Met (CMET) - Lipid panel  Hypothyroidism due to Hashimoto's thyroiditis Managed by endocrine.  Reviewed recent labs with normal thyroid panel  Prediabetes She is attempting to start watching her diet more closely and exercise.  Last A1c 6.8.  She is not on medications at this time. Briefly discussed Metformin today and she is willing to consider start if A1c still elevated. -  Hemoglobin A1c  Vitamin D deficiency He has not been consistent with her vitamin D supplementation.  She reports she will get back on track. - Vitamin D (25 hydroxy)     Reviewed expectations re: course of current medical issues.  Discussed self-management of symptoms.  Outlined signs and symptoms indicating need for more acute intervention.  Patient verbalized understanding and all questions were answered.  Patient received an After-Visit Summary.    Orders Placed This Encounter  Procedures   HM PAP SMEAR   CBC w/Diff   Comp Met (CMET)   Vitamin D (25 hydroxy)   Lipid panel   Hemoglobin A1c   Meds ordered this encounter  Medications   buPROPion (WELLBUTRIN XL) 150 MG 24 hr tablet    Sig: Take 1 tablet (150 mg total) by mouth daily.    Dispense:  90 tablet    Refill:  1   Referral Orders  No referral(s) requested today     Note is dictated utilizing voice recognition software. Although note has been proof read prior to signing, occasional typographical errors still can be missed. If any questions arise, please do not hesitate to call for verification.   electronically signed by:  Howard Pouch, DO  [place]

## 2020-10-14 ENCOUNTER — Other Ambulatory Visit: Payer: Self-pay | Admitting: Family Medicine

## 2020-10-14 MED ORDER — METFORMIN HCL 500 MG PO TABS: 500.0000 mg | ORAL_TABLET | Freq: Two times a day (BID) | ORAL | 1 refills | Status: DC

## 2020-10-14 NOTE — Telephone Encounter (Signed)
Please call patient: -Her liver and kidney functions are normal. -Vitamin D is low normal.  I would encourage her to restart her vitamin D supplement to keep this around 40-50 goal for bone health.  Hers is currently 30. -Blood cell counts are normal -Her cholesterol panel is much improved with her bad cholesterol/LDL decreasing from 160, now is 120.  Her triglycerides are just mildly elevated, I would encourage her to increase the fiber in her diet with 2 servings of Metamucil daily.  This will help decrease her triglycerides. -Her sugar was 132 and her A1c was 6.7.  This is the diabetic range.  We discussed starting low-dose Metformin during her office visit.  I have prescribed this for for her.  This is twice a day dosing.    -Please ask her what pharmacy she wants this sent to.  I have pended the medication.   Recommend follow-up in 4 months.

## 2020-10-14 NOTE — Telephone Encounter (Signed)
Spoke with pt regarding labs and instructions.   

## 2020-10-16 NOTE — Progress Notes (Unsigned)
Cardiology Office Note:    Date:  10/18/2020   ID:  Allison Compton, DOB 03-02-1966, MRN [id number]  PCP:  Ma Hillock, DO  Cardiologist:  No primary care provider on file.  Electrophysiologist:  None   Referring MD: Ma Hillock, DO   Chief Complaint  Patient presents with  . Coronary Artery Disease    History of Present Illness:    Allison Compton is a 55 y.o. female with a hx of hyperlipidemia, prediabetes who presents for follow-up.  She was referred by Dr. Raoul Pitch for evaluation of chest pain, initially seen on 07/19/2020.  She reports she started having chest pain in the last few weeks.  Has occurred mainly at night.  Describes left-sided sharp pain, 6-7 out of 10 in intensity.  Also reports left shoulder/arm pain.  Can last for up to 30 minutes.  Occurs about every 2 days.  For exercise, she walks her dog, denies any chest pain while doing this.  Denies any shortness of breath.  Also reports he started having lightheadedness when she turns her head certain ways.  Denies any syncope.  Does report mild chronic lower extremity edema.  She also reports around the same time of her chest pain she started having issues with lightheadedness when she turns her head in certain directions.  Reports this has resolved.  Denies any syncope.  Reports chronic mild lower extremity edema.  Reports palpitations when she drinks caffeine, which she has stopped.  She smoked from age [age], none since that time.  Family history includes significant hyperlipidemia and mother has TIAs.  She reports that she took atorvastatin for 1 year but developed myalgias and Achilles tendinitis.  Recently was prescribed Zetia but has not started yet.  Coronary CTA on 08/26/2020 showed nonobstructive CAD with noncalcified plaque in the proximal LAD causing mild (25 to 49%) stenosis, calcium score 0.  Echocardiogram on 08/11/2020 showed normal biventricular function, no significant valvular disease.  Since last clinic visit,  she reports that she has been going through a lot.  Her husband passed away in 10/27/22.  She also had an emergent appendectomy in 10-27-22.  She reports has had 1 episode of chest pain in the last 3 months.  Denies any dyspnea, lightheadedness, palpitations or syncope.  Does report some lower extremity edema.  States that she has been exercising by walking her dog for total of about 45 minutes daily.   Past Medical History:  Diagnosis Date  . Acute appendicitis 09/24/2020  . Anal tear   . 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 Eczema   . Edema of both legs   . Elevated blood pressure reading   . Fibroids   . Hashimoto's disease   . Hyperlipidemia   . Pre-diabetes    Pt states she is "borderline" for yrs.  . Seasonal affective disorder ([place])    PMH of    Past Surgical History:  Procedure Laterality Date  . ANAL FISTULOTOMY  2019  . APPENDECTOMY  10-27-2020  . CERVICAL BIOPSY  W/ LOOP ELECTRODE EXCISION  2002  . LAPAROSCOPIC ASSISTED VAGINAL HYSTERECTOMY N/A 02/10/2014   Procedure: LAPAROSCOPIC ASSISTED VAGINAL HYSTERECTOMY;  Surgeon: Allena Katz, MD;  Location: [facility];  Service: Gynecology;  Laterality: N/A;  . SALPINGOOPHORECTOMY Bilateral 02/10/2014   Procedure: SALPINGO OOPHORECTOMY;  Surgeon: Allena Katz, MD;  Location: [facility];  Service: Gynecology;  Laterality: Bilateral;  . TOTAL HIP ARTHROPLASTY Right 10/07/2018  Procedure: TOTAL HIP ARTHROPLASTY ANTERIOR APPROACH;  Surgeon: Paralee Cancel, MD;  Location: [facility];  Service: Orthopedics;  Laterality: Right;  70 mins    Current Medications: Current Meds  Medication Sig  . buPROPion (WELLBUTRIN XL) 150 MG 24 hr tablet Take 1 tablet (150 mg total) by mouth daily.  Marland Kitchen ezetimibe (ZETIA) 10 MG tablet Take 1 tablet (10 mg total) by mouth daily.  . metFORMIN (GLUCOPHAGE) 500 MG tablet Take 1 tablet (500 mg total) by mouth 2 (two) times daily with a meal.  . rosuvastatin (CRESTOR) 5 MG tablet Take 1 tablet (5  mg total) by mouth daily.  Marland Kitchen SYNTHROID 125 MCG tablet Take 1 tablet (125 mcg total) by mouth daily before breakfast.     Allergies:   Statins, Adhesive [tape], Latex, and Nickel   Social History   Socioeconomic History  . Marital status: Married    Spouse name: Not on file  . Number of children: Not on file  . Years of education: Not on file  . Highest education level: Not on file  Occupational History  . Not on file  Tobacco Use  . Smoking status: Former Smoker    Quit date: 08/13/1985    Years since quitting: 35.2  . Smokeless tobacco: Never Used  . Tobacco comment: age [age], up to 1 ppd  Vaping Use  . Vaping Use: Never used  Substance and Sexual Activity  . Alcohol use: Not Currently  . Drug use: No  . Sexual activity: Never    Birth control/protection: Surgical  Other Topics Concern  . Not on file  Social History Narrative   Married, "Allison Compton). 1 adult son Allison Compton).    BA degree, Tree surgeon.    Drinks caffeine occasionally, uses herbal remedies.   Wears her seatbelt, smoke detector in the home.   Firearms in the home, locked cabinet.   Feels safe in her relationships.         Epworth Sleepiness Scale = 6 (as of 05/09/2016)   Social Determinants of Health   Financial Resource Strain: Not on file  Food Insecurity: Not on file  Transportation Needs: Not on file  Physical Activity: Not on file  Stress: Not on file  Social Connections: Not on file     Family History: The patient's family history includes Alzheimer's disease in her paternal grandfather; Asthma in her mother; COPD in her mother; Cancer in her father and mother; Clotting disorder in her mother; Diabetes in her mother; GI problems in her brother; Heart disease in her father and paternal uncle; Heart failure in her maternal grandfather and paternal grandmother; Hyperlipidemia in her maternal grandmother and mother; Hypertension in her father; Kidney cancer in her father; Pancreatic cancer in her  paternal grandmother; Seasonal affective disorder in her mother and sister; Stroke in her mother; Stroke (age of onset: 60) in her maternal grandmother.  ROS:   Please see the history of present illness.     All other systems reviewed and are negative.  EKGs/Labs/Other Studies Reviewed:    The following studies were reviewed today:   EKG:  EKG is not ordered today.  The ekg ordered at prior clinic visit demonstrates normal sinus rhythm, rate 77, nonspecific T wave flattening  Recent Labs: 06/03/2020: TSH 1.53 10/13/2020: ALT 20; BUN 11; Creatinine, Ser 0.82; Hemoglobin 13.9; Platelets 253.0; Potassium 4.5; Sodium 138  Recent Lipid Panel    Component Value Date/Time   CHOL 206 (H) 10/13/2020 1010   CHOL 263 (H) 07/17/2019  0000   TRIG 176.0 (H) 10/13/2020 1010   HDL 50.80 10/13/2020 1010   HDL 58 07/17/2019 0000   CHOLHDL 4 10/13/2020 1010   VLDL 35.2 10/13/2020 1010   LDLCALC 120 (H) 10/13/2020 1010   LDLCALC 182 (H) 07/17/2019 0000   LDLDIRECT 139.9 02/17/2008 0852    Physical Exam:    VS:  BP 103/63   Pulse 72   Ht 5\' 9"  (1.753 m)   Wt 231 lb 6.4 oz (105 kg)   LMP 02/06/2012   SpO2 99%   BMI 34.17 kg/m     Wt Readings from Last 3 Encounters:  10/18/20 231 lb 6.4 oz (105 kg)  10/13/20 238 lb (108 kg)  10/04/20 232 lb (105.2 kg)     GEN: Well nourished, well developed in no acute distress HEENT: Normal NECK: No JVD; No carotid bruits CARDIAC: RRR, no murmurs, rubs, gallops RESPIRATORY:  Clear to auscultation without rales, wheezing or rhonchi  ABDOMEN: Soft, non-tender, non-distended MUSCULOSKELETAL:  No edema; No deformity  SKIN: Warm and dry NEUROLOGIC:  Alert and oriented x 3 PSYCHIATRIC:  Normal affect   ASSESSMENT:    1. Chest pain of uncertain etiology   2. Hyperlipidemia, unspecified hyperlipidemia type    PLAN:    Chest pain: Atypical in description but does have CAD risk factors (hyperlipidemia, T2DM).  Coronary CTA on 08/26/2020 showed  nonobstructive CAD with noncalcified plaque in the proximal LAD causing mild (25 to 49%) stenosis, calcium score 0.  Echocardiogram on 08/11/2020 showed normal biventricular function, no significant valvular disease. -Continue Zetia 10 mg daily, add rosuvastatin 5 mg daily  Hyperlipidemia:  LDL 160 on 06/03/2020.  Unable to tolerate atorvastatin due to myalgias/Achilles tendinitis.  Started on Zetia 10 mg daily, LDL improved to 120 on 10/13/2020.  Will trial low-dose rosuvastatin 5 mg daily.  Repeat fasting lipid panel in 2 months.  If unable to tolerate rosuvastatin, will refer to lipid clinic for evaluation for PCSK9 inhibitor   T2DM: A1c 6.7% on 10/13/2020.  Started on Metformin  RTC in 6 months   Medication Adjustments/Labs and Tests Ordered: Current medicines are reviewed at length with the patient today.  Concerns regarding medicines are outlined above.  Orders Placed This Encounter  Procedures  . Lipid panel   Meds ordered this encounter  Medications  . rosuvastatin (CRESTOR) 5 MG tablet    Sig: Take 1 tablet (5 mg total) by mouth daily.    Dispense:  90 tablet    Refill:  3    Patient Instructions  Medication Instructions:  START rosuvastatin (Crestor) 5 mg daily  *If you need a refill on your cardiac medications before your next appointment, please call your pharmacy*   Lab Work: Please return for FASTING labs in 2 months (CMET, CBC, Lipid, TSH) --order provided to have completed at primary doctor if you would like  Our in office lab hours are Monday-Friday 8:00-4:00, closed for lunch 12:45-1:45 pm.  No appointment needed.  Follow-Up: At [facility], you and your health needs are our priority.  As part of our continuing mission to provide you with exceptional heart care, we have created designated Provider Care Teams.  These Care Teams include your primary Cardiologist (physician) and Advanced Practice Providers (APPs -  Physician Assistants and Nurse Practitioners) who  all work together to provide you with the care you need, when you need it.  We recommend signing up for the patient portal called "MyChart".  Sign up information is provided on this  After Visit Summary.  MyChart is used to connect with patients for Virtual Visits (Telemedicine).  Patients are able to view lab/test results, encounter notes, upcoming appointments, etc.  Non-urgent messages can be sent to your provider as well.   To learn more about what you can do with MyChart, go to [email or web address].    Your next appointment:   6 month(s)  The format for your next appointment:   In Person  Provider:   Oswaldo Milian, MD        Signed, Donato Heinz, MD  10/18/2020 8:55 AM    [place]

## 2020-10-18 ENCOUNTER — Ambulatory Visit (INDEPENDENT_AMBULATORY_CARE_PROVIDER_SITE_OTHER): Payer: 59 | Admitting: Cardiology

## 2020-10-18 ENCOUNTER — Other Ambulatory Visit: Payer: Self-pay

## 2020-10-18 ENCOUNTER — Encounter: Payer: Self-pay | Admitting: Cardiology

## 2020-10-18 VITALS — BP 103/63 | HR 72 | Ht 69.0 in | Wt 231.4 lb

## 2020-10-18 DIAGNOSIS — R079 Chest pain, unspecified: Secondary | ICD-10-CM

## 2020-10-18 DIAGNOSIS — E785 Hyperlipidemia, unspecified: Secondary | ICD-10-CM

## 2020-10-18 MED ORDER — ROSUVASTATIN CALCIUM 5 MG PO TABS: 5.0000 mg | ORAL_TABLET | Freq: Every day | ORAL | 3 refills | Status: DC

## 2020-10-18 NOTE — Patient Instructions (Signed)
Medication Instructions:  START rosuvastatin (Crestor) 5 mg daily  *If you need a refill on your cardiac medications before your next appointment, please call your pharmacy*   Lab Work: Please return for FASTING labs in 2 months (CMET, CBC, Lipid, TSH) --order provided to have completed at primary doctor if you would like  Our in office lab hours are Monday-Friday 8:00-4:00, closed for lunch 12:45-1:45 pm.  No appointment needed.  Follow-Up: At [facility], you and your health needs are our priority.  As part of our continuing mission to provide you with exceptional heart care, we have created designated Provider Care Teams.  These Care Teams include your primary Cardiologist (physician) and Advanced Practice Providers (APPs -  Physician Assistants and Nurse Practitioners) who all work together to provide you with the care you need, when you need it.  We recommend signing up for the patient portal called "MyChart".  Sign up information is provided on this After Visit Summary.  MyChart is used to connect with patients for Virtual Visits (Telemedicine).  Patients are able to view lab/test results, encounter notes, upcoming appointments, etc.  Non-urgent messages can be sent to your provider as well.   To learn more about what you can do with MyChart, go to [email or web address].    Your next appointment:   6 month(s)  The format for your next appointment:   In Person  Provider:   Oswaldo Milian, MD

## 2020-11-14 ENCOUNTER — Ambulatory Visit: Payer: 59 | Admitting: Family Medicine

## 2020-11-14 NOTE — Telephone Encounter (Signed)
Patient called to see if appt was needed for today, she is scheduled to follow up with provider in July. Appt today cancelled no charge - error in provider schedule

## 2020-11-16 ENCOUNTER — Other Ambulatory Visit: Payer: Self-pay | Admitting: Internal Medicine

## 2020-11-22 DIAGNOSIS — E785 Hyperlipidemia, unspecified: Secondary | ICD-10-CM

## 2020-11-24 ENCOUNTER — Other Ambulatory Visit: Payer: Self-pay

## 2020-11-24 ENCOUNTER — Ambulatory Visit (INDEPENDENT_AMBULATORY_CARE_PROVIDER_SITE_OTHER): Payer: 59 | Admitting: Pharmacist Clinician (PhC)/ Clinical Pharmacy Specialist

## 2020-11-24 DIAGNOSIS — E782 Mixed hyperlipidemia: Secondary | ICD-10-CM

## 2020-11-24 NOTE — Progress Notes (Signed)
11/24/2020 Allison Compton [birth year] [id number]   HPI:  Allison Compton is a 55 y.o. female patient of Dr Gardiner Rhyme, who presents today for a lipid clinic evaluation.  See pertinent past medical history below.  She had myalgias as well as tendinitis with atorvastatin, so when Dr. Gardiner Rhyme saw her in March he started her on a low dose of rosuvastatin.   Within a month she was developing the same issues within 4-5 days, and had to discontinue.    She is here to talk about her options today.  She notes that she is starting to change her eating habits and is incorporating more fruits and vegetables.  Her husband passed away in 10/24/22 (cancer) and she has had quite a bit of stress this year.    Past Medical History: ASCVD Non-calcified stenosis of proximal LAD   angina L side pain in chest, left shoulder/arm, occurs multiple times per week, tapered off recently (pt wondering if stress induced)  Vitamin D deficiency Most recently 27    Current Medications: none  Cholesterol Goals:   Intolerant/previously tried: atorvastatin - myalgias and Achilles tendinits  Family history: mgm, mother both with elevated cholesterol, MGM with multple strokes, mother with stroke (at 63), has DM, COPD, thrombocytosis; father with htn, kidney cancer w/ metastases; brother w/DM, sister doesn't talk about health; 1 son now 74  Diet: mostly home cooked meals, started with Hello Fresh recently, significantly more vegetables, Cheerios and oatmeal most mornings. Taking OTC omeaga 3  Exercise:  Walking dogs 45 minutes per day; Agility and obedience training with her dogs keeps her out in the yard and moving more.    Labs:  3/22: TC 206, TG 176, HDL 50.8, LDL 120 (atorvastatin)  10/21:  TC 237, TG 130, HDL 51.4, LDL 160 (baseline)   Current Outpatient Medications  Medication Sig Dispense Refill  . buPROPion (WELLBUTRIN XL) 150 MG 24 hr tablet Take 1 tablet (150 mg total) by mouth daily. 90 tablet 1  . calcium-vitamin  D (OSCAL WITH D) 500-200 MG-UNIT tablet Take 1 tablet by mouth. Take one tablet by mouth daily    . ezetimibe (ZETIA) 10 MG tablet Take 1 tablet (10 mg total) by mouth daily. 90 tablet 3  . metFORMIN (GLUCOPHAGE) 500 MG tablet Take 1 tablet (500 mg total) by mouth 2 (two) times daily with a meal. 180 tablet 1  . SYNTHROID 125 MCG tablet TAKE 1 TABLET DAILY BEFORE BREAKFAST 90 tablet 0   No current facility-administered medications for this visit.    Allergies  Allergen Reactions  . Atorvastatin     myalgia  . Rosuvastatin     myalgias  . Statins Other (See Comments)    Myalgia, Achilles tendonitis  . Adhesive [Tape] Rash    Sometimes peels skin off  . Latex Rash    Sometimes peels skin off  . Nickel Rash    Past Medical History:  Diagnosis Date  . Acute appendicitis 09/24/2020  . Anal tear   . 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 Eczema   . Edema of both legs   . Elevated blood pressure reading   . Fibroids   . Hashimoto's disease   . Hyperlipidemia   . Pre-diabetes    Pt states she is "borderline" for yrs.  . Seasonal affective disorder ([place])    PMH of    Blood pressure 118/70, pulse 74, resp. rate 16, height 5\' 9"  (1.753 m), weight 230 lb (  104.3 kg), last menstrual period 02/06/2012, SpO2 99 %.   Hyperlipidemia Patient with CVD and LDL cholesterol not at goal.  She has been unable to tolerate 2 different statin drugs due to myalgias and tendinitis.  Currently on ezetimibe 10 mg.  Reviewed options for lowering LDL cholesterol, including PCSK-9 inhibitors, bempedoic acid and inclisiran.  Discussed mechanisms of action, dosing, side effects and potential decreases in LDL cholesterol.  Answered all patient questions.  Based on this information, patient would prefer to start [place].  Once approved by her insurer, we will have her repeat labs after 5-6 doses.  She will continue to work on dietary changes and exercise.     Tommy Medal PharmD  [facility] [address] [place], [place] [address] [phone]

## 2020-11-24 NOTE — Patient Instructions (Signed)
Your Results:             Your most recent labs Goal  Total Cholesterol 206 < 200  Triglycerides 176 < 150  HDL (happy/good cholesterol) 50.8 > 40  LDL (lousy/bad cholesterol 120 < 70   Medication changes:  We will start the process to get Repatha covered by the insurance.  Once approved, take 1 shot every 14 days.    Lab orders:  We will repeat cholesterol after 4-6 doses.  We will mail a lab order to you in about 2 months.   Thank you for choosing [facility]

## 2020-11-24 NOTE — Assessment & Plan Note (Signed)
Patient with CVD and LDL cholesterol not at goal.  She has been unable to tolerate 2 different statin drugs due to myalgias and tendinitis.  Currently on ezetimibe 10 mg.  Reviewed options for lowering LDL cholesterol, including PCSK-9 inhibitors, bempedoic acid and inclisiran.  Discussed mechanisms of action, dosing, side effects and potential decreases in LDL cholesterol.  Answered all patient questions.  Based on this information, patient would prefer to start [place].  Once approved by her insurer, we will have her repeat labs after 5-6 doses.  She will continue to work on dietary changes and exercise.

## 2020-11-25 ENCOUNTER — Telehealth: Payer: Self-pay

## 2020-11-25 DIAGNOSIS — E782 Mixed hyperlipidemia: Secondary | ICD-10-CM

## 2020-11-25 MED ORDER — REPATHA SURECLICK 140 MG/ML ~~LOC~~ SOAJ: 140.0000 mg | SUBCUTANEOUS | 11 refills | Status: DC

## 2020-11-25 NOTE — Telephone Encounter (Signed)
lle and lmomed the pt that their insurance approved repatha, rx sent, pt instructed to complete fastig labs after 2 months and also let us know if unaffordable

## 2021-01-19 ENCOUNTER — Encounter: Payer: Self-pay | Admitting: Internal Medicine

## 2021-01-19 ENCOUNTER — Other Ambulatory Visit: Payer: Self-pay

## 2021-01-19 ENCOUNTER — Ambulatory Visit (INDEPENDENT_AMBULATORY_CARE_PROVIDER_SITE_OTHER): Payer: 59 | Admitting: Internal Medicine

## 2021-01-19 VITALS — BP 122/88 | HR 61 | Ht 69.0 in | Wt 225.6 lb

## 2021-01-19 DIAGNOSIS — E038 Other specified hypothyroidism: Secondary | ICD-10-CM

## 2021-01-19 DIAGNOSIS — E559 Vitamin D deficiency, unspecified: Secondary | ICD-10-CM

## 2021-01-19 DIAGNOSIS — E785 Hyperlipidemia, unspecified: Secondary | ICD-10-CM | POA: Diagnosis not present

## 2021-01-19 DIAGNOSIS — E063 Autoimmune thyroiditis: Secondary | ICD-10-CM

## 2021-01-19 DIAGNOSIS — E119 Type 2 diabetes mellitus without complications: Secondary | ICD-10-CM

## 2021-01-19 DIAGNOSIS — E1165 Type 2 diabetes mellitus with hyperglycemia: Secondary | ICD-10-CM | POA: Insufficient documentation

## 2021-01-19 DIAGNOSIS — E1169 Type 2 diabetes mellitus with other specified complication: Secondary | ICD-10-CM | POA: Insufficient documentation

## 2021-01-19 LAB — LIPID PANEL
Cholesterol: 107 mg/dL (ref 0–200)
HDL: 60.3 mg/dL (ref >39.00)
LDL Cholesterol: 18 mg/dL (ref 0–99)
NonHDL: 46.92
Total CHOL/HDL Ratio: 2
Triglycerides: 145 mg/dL (ref 0.0–149.0)
VLDL: 29 mg/dL (ref 0.0–40.0)

## 2021-01-19 LAB — POCT GLYCOSYLATED HEMOGLOBIN (HGB A1C): Hemoglobin A1C: 6.5 % — AB (ref 4.0–5.6)

## 2021-01-19 LAB — T4, FREE: Free T4: 1.26 ng/dL (ref 0.60–1.60)

## 2021-01-19 LAB — TSH: TSH: 1.13 u[IU]/mL (ref 0.35–4.50)

## 2021-01-19 NOTE — Progress Notes (Signed)
Patient ID: Allison Compton, female   DOB: [birth year], 55 y.o.   MRN: [id number]  This visit occurred during the SARS-CoV-2 public health emergency.  Safety protocols were in place, including screening questions prior to the visit, additional usage of staff PPE, and extensive cleaning of exam room while observing appropriate contact time as indicated for disinfecting solutions.   HPI  Allison Compton is a 55 y.o.-year-old female, returning for f/u for Hashimoto's hypothyroidism and prediabetes. Last visit 6 months ago.  Interim history: Her husband died 10-14-2020 from metastatic prostate cancer. She had appendicitis 09/24/2020 and had to have laparoscopic appendectomy. She continues on Wellbutrin for depression. She started to be increasingly fatigued 2.5 weeks ago. She found ticks on her skin.  Pt. was dx with hypothyroidism in 04/2015.  She is on Synthroid d.a.w. 125 mcg daily: - in am - fasting - at least 30 min from b'fast - no calcium - no iron - no multivitamins - no PPIs - not on Biotin  Reviewed her TFTs: Lab Results  Component Value Date   TSH 1.53 06/03/2020   TSH 3.93 07/17/2019   TSH 2.14 06/13/2018   TSH 3.57 01/07/2018   TSH 0.90 05/17/2017   TSH 0.81 11/20/2016   TSH 1.74 07/27/2016   TSH 6.17 (H) 05/28/2016   TSH 4.93 (H) 03/13/2016   TSH 7.92 (H) 12/19/2015   FREET4 1.03 06/03/2020   FREET4 0.99 07/17/2019   FREET4 1.04 06/13/2018   FREET4 0.99 01/07/2018   FREET4 1.5 05/17/2017   FREET4 1.28 11/20/2016   FREET4 1.29 07/27/2016   FREET4 1.02 05/28/2016   FREET4 0.92 03/13/2016   FREET4 0.90 12/19/2015    She has Hashimoto's thyroiditis Component     Latest Ref Rng & Units 01/07/2018 06/13/2018  Thyroperoxidase Ab SerPl-aCnc     <9 IU/mL 96 (H) 139 (H)  Thyroglobulin Ab     < or = 1 IU/mL 1 <1   Previously: Component     Latest Ref Rng & Units 05/30/2015 05/28/2016 11/20/2016  Thyroperoxidase Ab SerPl-aCnc     <9 IU/mL 782 (H) 652 (H) 328 (H)   Thyroglobulin Ab     <2 IU/mL 2 (H) 1   Previously on low-dose selenium.  Pt denies: - feeling nodules in neck - hoarseness - dysphagia - choking - SOB with lying down  She has + FH of thyroid disorders in: several cousins. No FH of thyroid cancer. No h/o radiation tx to head or neck.  No herbal supplements. No Biotin use. No recent steroids use.   DM2: -New diagnosis -10/13/2020 -She was started on metformin 500 mg twice a day by PCP at that time.  She tolerates this well.  Reviewed HbA1c levels: Lab Results  Component Value Date   HGBA1C 6.7 (H) 10/13/2020   HGBA1C 6.8 (H) 06/03/2020   HGBA1C 6.4 07/17/2019   No CKD: Lab Results  Component Value Date   BUN 11 10/13/2020   Lab Results  Component Value Date   CREATININE 0.82 10/13/2020   No MAU: Lab Results  Component Value Date   MICRALBCREAT 1.3 07/17/2019   MICRALBCREAT 21.5 09/29/2008   MICRALBCREAT 2.0 02/17/2008   MICRALBCREAT 3.9 05/29/2007   + HL: Lab Results  Component Value Date   CHOL 206 (H) 10/13/2020   HDL 50.80 10/13/2020   LDLCALC 120 (H) 10/13/2020   LDLDIRECT 139.9 02/17/2008   TRIG 176.0 (H) 10/13/2020   CHOLHDL 4 10/13/2020  We tried to start Atorvastatin but she developed  mm aches and Achilles tendinitis (had to have sx) and we stopped.  Currently on Repatha and also ezetimibe 10 mg daily per the Lipid clinic.  In the past, she was on a gluten and dairy free diet and her weight and TPO antibodies improved.  Afterwards, she tried a keto diet but TPO antibodies were sent.  She also has a history of hysterectomy in 2015.   She has a history of vitamin D deficiency: Lab Results  Component Value Date   VD25OH 30.06 10/13/2020   VD25OH 26.99 (L) 06/03/2020   VD25OH 27.13 (L) 07/17/2019   VD25OH 36 05/17/2017   VD25OH 48.22 11/20/2016   VD25OH 25.96 (L) 05/28/2016   She is on a vitamin D supplement: 5000 units daily - taken inconsistently.  She had THR in  09/2018.  ROS: Constitutional: no weight gain/+ weight loss, no fatigue, no subjective hyperthermia, no subjective hypothermia Eyes: no blurry vision, no xerophthalmia ENT: no sore throat, + see HPI Cardiovascular: no CP/no SOB/no palpitations/+ leg swelling (improved) Respiratory: no cough/no SOB/no wheezing Gastrointestinal: no N/no V/no D/no C/no acid reflux Musculoskeletal: no muscle aches/no joint aches Skin: no rashes, no hair loss Neurological: no tremors/no numbness/no tingling/no dizziness  I reviewed pt's medications, allergies, PMH, social hx, family hx, and changes were documented in the history of present illness. Otherwise, unchanged from my initial visit note.  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   Eczema    Edema of both legs    Elevated blood pressure reading    Fibroids    Hashimoto's disease    Hyperlipidemia    Pre-diabetes    Pt states she is "borderline" for yrs.   Seasonal affective disorder [facility])    PMH of   Past Surgical History:  Procedure Laterality Date   ANAL FISTULOTOMY  2019   APPENDECTOMY  09/2020   CERVICAL BIOPSY  W/ LOOP ELECTRODE EXCISION  2002   LAPAROSCOPIC ASSISTED VAGINAL HYSTERECTOMY N/A 02/10/2014   Procedure: LAPAROSCOPIC ASSISTED VAGINAL HYSTERECTOMY;  Surgeon: Allena Katz, MD;  Location: [facility];  Service: Gynecology;  Laterality: N/A;   SALPINGOOPHORECTOMY Bilateral 02/10/2014   Procedure: SALPINGO OOPHORECTOMY;  Surgeon: Allena Katz, MD;  Location: [facility];  Service: Gynecology;  Laterality: Bilateral;   TOTAL HIP ARTHROPLASTY Right 10/07/2018   Procedure: TOTAL HIP ARTHROPLASTY ANTERIOR APPROACH;  Surgeon: Paralee Cancel, MD;  Location: [facility];  Service: Orthopedics;  Laterality: Right;  70 mins   Social History   Social History   Marital Status: Married    Spouse Name: N/A   Number of Children: 1   Occupational  History   n/a   Social History Main Topics   Smoking status: Former Smoker    Quit date: 08/13/1985   Smokeless tobacco: Not on file     Comment: age [age], up to 1 ppd   Alcohol Use: Yes     Comment: rarely once a month or less   Drug Use: No   Current Outpatient Medications on File Prior to Visit  Medication Sig Dispense Refill   buPROPion (WELLBUTRIN XL) 150 MG 24 hr tablet Take 1 tablet (150 mg total) by mouth daily. 90 tablet 1   calcium-vitamin D (OSCAL WITH D) 500-200 MG-UNIT tablet Take 1 tablet by mouth. Take one tablet by mouth daily     Evolocumab (REPATHA SURECLICK) 509 MG/ML  SOAJ Inject 140 mg into the skin every 14 (fourteen) days. 2 mL 11   ezetimibe (ZETIA) 10 MG tablet Take 1 tablet (10 mg total) by mouth daily. 90 tablet 3   metFORMIN (GLUCOPHAGE) 500 MG tablet Take 1 tablet (500 mg total) by mouth 2 (two) times daily with a meal. 180 tablet 1   SYNTHROID 125 MCG tablet TAKE 1 TABLET DAILY BEFORE BREAKFAST 90 tablet 0   No current facility-administered medications on file prior to visit.   Allergies  Allergen Reactions   Atorvastatin     myalgia   Rosuvastatin     myalgias   Statins Other (See Comments)    Myalgia, Achilles tendonitis   Adhesive [Tape] Rash    Sometimes peels skin off   Latex Rash    Sometimes peels skin off   Nickel Rash   Family History  Problem Relation Age of Onset   Diabetes Mother    Asthma Mother    Seasonal affective disorder Mother    Clotting disorder Mother        excess platelets   COPD Mother    Cancer Mother    Hyperlipidemia Mother    Stroke Mother    Kidney cancer Father    Hypertension Father    Cancer Father        kidney   Heart disease Father    Hyperlipidemia Maternal Grandmother    Stroke Maternal Grandmother 75       mini strokes   Pancreatic cancer Paternal Grandmother    Heart failure Paternal Grandmother    Heart failure Maternal Grandfather        MI @ 49   Alzheimer's disease Paternal  Grandfather    Seasonal affective disorder Sister    GI problems Brother        GI bleed   Heart disease Paternal Uncle    PE: BP 122/88 (BP Location: Right Arm, Patient Position: Sitting, Cuff Size: Normal)   Pulse 61   Ht 5\' 9"  (1.753 m)   Wt 225 lb 9.6 oz (102.3 kg)   LMP 02/06/2012   SpO2 97%   BMI 33.32 kg/m  Wt Readings from Last 3 Encounters:  01/19/21 225 lb 9.6 oz (102.3 kg)  11/24/20 230 lb (104.3 kg)  10/18/20 231 lb 6.4 oz (105 kg)   Constitutional: overweight, in NAD Eyes: PERRLA, EOMI, no exophthalmos ENT: moist mucous membranes, no thyromegaly, no cervical lymphadenopathy Cardiovascular: RRR, No MRG Respiratory: CTA B Gastrointestinal: abdomen soft, NT, ND, BS+ Musculoskeletal: no deformities, strength intact in all 4 Skin: moist, warm, no rashes Neurological: no tremor with outstretched hands, DTR normal in all 4  ASSESSMENT: 1. Hypothyroidism - Thyroid U/S (06/13/2015): Thyroid tissue is mildly heterogeneous but no focal nodules.  2. DM2  3.  Hyperlipidemia  4. Vit D def.  PLAN:  1. Patient with Hashimoto's hypothyroidism, on brand-name Synthroid therapy (gets this from [facility] in [place]) - latest thyroid labs reviewed with pt. >> normal: Lab Results  Component Value Date   TSH 1.53 06/03/2020  - she continues on LT4 125 mcg daily - pt feels good on this dose but she started to have fatigue recently.  She found several ticks on her body and she remembers that she had Lyme's disease in the past and felt very tired around that time.  I advised her to see PCP as soon as possible for this. - we discussed about taking the thyroid hormone every day, with water, >30 minutes before  breakfast, separated by >4 hours from acid reflux medications, calcium, iron, multivitamins. Pt. is taking it correctly. - will check thyroid tests today: TSH and fT4 - If labs are abnormal, she will need to return for repeat TFTs in 1.5 months  2.  Type 2  diabetes -Patient had 2 HbA1c levels in the diabetic range, at 6.8% before last visit and 6.7% in 10/2020, confirming the diagnosis of diabetes -In the past I suggested a referral to nutrition but she declined -At last visit she mentions that she was planning to talk to a counselor to help her control her eating.  She was trying to eliminate sweets.  I also recommended to try to eliminate fatty foods to improve her insulin resistance.  She started to change her diet recently and lost 6 pounds. -At today's visit, she is taking metformin 500 mg twice a day, started by PCP 10/2020.  This is tolerated well. -We rechecked an HbA1c at today's visit >> 6.5% (improved)  3.  Hyperlipidemia -Reviewed latest lipid panel from 10/2020: LDL still high, triglycerides slightly high, HDL normal: Lab Results  Component Value Date   CHOL 206 (H) 10/13/2020   HDL 50.80 10/13/2020   LDLCALC 120 (H) 10/13/2020   LDLDIRECT 139.9 02/17/2008   TRIG 176.0 (H) 10/13/2020   CHOLHDL 4 10/13/2020  -We tried to use a statin but she developed myalgias and Achilles tendinitis-she had to have physical therapy for this the pain continued for several months.  It resolved after stopping the statin. -I referred her to the lipid clinic and she is now on Repatha and ezetimibe 10 mg daily.  She tolerates this well. -Per her request, we will check this today and will send it to Dr. Debara Pickett  4. Vit D def. -Latest level was normal in 10/2020: 30.06 -Continues 5000 units vitamin D daily -was not very consistent with taking it around the time of her husband's death, but she now restarted to take it daily -We will recheck her level at next visit  Component     Latest Ref Rng & Units 01/19/2021  Cholesterol     0 - 200 mg/dL 107  Triglycerides     0.0 - 149.0 mg/dL 145.0  HDL Cholesterol     >39.00 mg/dL 60.30  VLDL     0.0 - 40.0 mg/dL 29.0  LDL (calc)     0 - 99 mg/dL 18  Total CHOL/HDL Ratio      2  NonHDL      46.92   T4,Free(Direct)     0.60 - 1.60 ng/dL 1.26  TSH     0.35 - 4.50 uIU/mL 1.13  Hemoglobin A1C     4.0 - 5.6 % 6.5 (A)  TFTs are normal,  and lipid panel is much improved.  We will forward the results to Dr. Debara Pickett.  Philemon Kingdom, MD PhD [facility] Endocrinology

## 2021-01-19 NOTE — Patient Instructions (Signed)
Please continue Synthroid 125 mcg daily.  Take the thyroid hormone every day, with water, at least 30 minutes before breakfast, separated by at least 4 hours from: - acid reflux medications - calcium - iron - multivitamins  Please continue vitamin 5000 units daily.  Please come back for a follow-up appointment in 1 year.

## 2021-01-23 NOTE — Progress Notes (Signed)
LDL 18 - on Repatha and zetia.  Ok to stop zetia, repeat cholesterol in 3 months. Will need a follow-up with me then.  Dr. Lemmie Evens

## 2021-01-24 ENCOUNTER — Telehealth: Payer: Self-pay | Admitting: Cardiology

## 2021-01-24 ENCOUNTER — Ambulatory Visit (INDEPENDENT_AMBULATORY_CARE_PROVIDER_SITE_OTHER): Payer: 59 | Admitting: Family Medicine

## 2021-01-24 ENCOUNTER — Encounter: Payer: Self-pay | Admitting: Family Medicine

## 2021-01-24 ENCOUNTER — Other Ambulatory Visit: Payer: Self-pay

## 2021-01-24 VITALS — BP 112/71 | HR 68 | Temp 98.1°F | Wt 229.0 lb

## 2021-01-24 DIAGNOSIS — R5383 Other fatigue: Secondary | ICD-10-CM

## 2021-01-24 DIAGNOSIS — W57XXXA Bitten or stung by nonvenomous insect and other nonvenomous arthropods, initial encounter: Secondary | ICD-10-CM

## 2021-01-24 DIAGNOSIS — E559 Vitamin D deficiency, unspecified: Secondary | ICD-10-CM

## 2021-01-24 LAB — COMPREHENSIVE METABOLIC PANEL
ALT: 18 U/L (ref 0–35)
AST: 12 U/L (ref 0–37)
Albumin: 4.3 g/dL (ref 3.5–5.2)
Alkaline Phosphatase: 88 U/L (ref 39–117)
BUN: 16 mg/dL (ref 6–23)
CO2: 30 mEq/L (ref 19–32)
Calcium: 9.4 mg/dL (ref 8.4–10.5)
Chloride: 103 mEq/L (ref 96–112)
Creatinine, Ser: 0.84 mg/dL (ref 0.40–1.20)
GFR: 77.14 mL/min (ref >60.00)
Glucose, Bld: 129 mg/dL — ABNORMAL HIGH (ref 70–99)
Potassium: 4.4 mEq/L (ref 3.5–5.1)
Sodium: 142 mEq/L (ref 135–145)
Total Bilirubin: 0.3 mg/dL (ref 0.2–1.2)
Total Protein: 6.8 g/dL (ref 6.0–8.3)

## 2021-01-24 LAB — CBC WITH DIFFERENTIAL/PLATELET
Basophils Absolute: 0.1 10*3/uL (ref 0.0–0.1)
Basophils Relative: 0.7 % (ref 0.0–3.0)
Eosinophils Absolute: 0.3 10*3/uL (ref 0.0–0.7)
Eosinophils Relative: 3.5 % (ref 0.0–5.0)
HCT: 40.2 % (ref 36.0–46.0)
Hemoglobin: 13.3 g/dL (ref 12.0–15.0)
Lymphocytes Relative: 26.7 % (ref 12.0–46.0)
Lymphs Abs: 2 10*3/uL (ref 0.7–4.0)
MCHC: 33.2 g/dL (ref 30.0–36.0)
MCV: 85.7 fl (ref 78.0–100.0)
Monocytes Absolute: 0.7 10*3/uL (ref 0.1–1.0)
Monocytes Relative: 8.6 % (ref 3.0–12.0)
Neutro Abs: 4.6 10*3/uL (ref 1.4–7.7)
Neutrophils Relative %: 60.5 % (ref 43.0–77.0)
Platelets: 233 10*3/uL (ref 150.0–400.0)
RBC: 4.69 Mil/uL (ref 3.87–5.11)
RDW: 14.1 % (ref 11.5–15.5)
WBC: 7.6 10*3/uL (ref 4.0–10.5)

## 2021-01-24 LAB — VITAMIN B12: Vitamin B-12: 507 pg/mL (ref 211–911)

## 2021-01-24 LAB — VITAMIN D 25 HYDROXY (VIT D DEFICIENCY, FRACTURES): VITD: 45.64 ng/mL (ref 30.00–100.00)

## 2021-01-24 MED ORDER — REPATHA SURECLICK 140 MG/ML ~~LOC~~ SOAJ: 140.0000 mg | SUBCUTANEOUS | 11 refills | Status: DC

## 2021-01-24 NOTE — Telephone Encounter (Signed)
Refill sent.

## 2021-01-24 NOTE — Progress Notes (Signed)
This visit occurred during the SARS-CoV-2 public health emergency.  Safety protocols were in place, including screening questions prior to the visit, additional usage of staff PPE, and extensive cleaning of exam room while observing appropriate contact time as indicated for disinfecting solutions.    Allison Compton , 31-Jul-1966, 55 y.o., female MRN: [id number] Patient Care Team    Relationship Specialty Notifications Start End  Ma Hillock, DO PCP - General Family Medicine  12/22/15   Philemon Kingdom, MD Consulting Physician Internal Medicine  12/23/15    Comment: prediabetes/thyroid    Chief Complaint  Patient presents with   Insect Bite    Pt c/o 5 tick bites within last 2 mos; pt c/o fatigue, body and joint aches; PHX of lyme disease;       Subjective: Pt presents for an OV with complaints of tick bite x5 with fatigue.  Patient reports she has been feeling extremely fatigued over the last couple weeks.  She thought it was secondary to recently having to plan services and bury her husband who passed away in 09-23-22.  And then she underwent an emergency appendectomy.  She states fatigue still seems to be very present and she reports she can stay in bed for over 12 hours and still not feel rested.  She does not think she snores that she is aware of and she has not noticed any apneic spells herself.  She feels from a depression standpoint she is doing well.  She recently had her thyroid and A1c tested at her endocrinologist in they were normal.  Diabetes is well controlled.  She denies any fevers, chills, cough, upper respiratory symptoms or GI symptoms.  She denies any recent illness. Reports she has had multiple tick bites over the last few months and she has concerned for Lyme disease.  She states she had been diagnosed with Lyme disease in the past and her titers were borderline about 20 years ago.  She denies any rash. She has started to repatha during this time and recently had  her cholesterol rechecked with a LDL of 18. Depression screen [facility] 2/9 06/16/2020 09/02/2019 06/14/2017 12/22/2015  Decreased Interest 3 0 0 0  Down, Depressed, Hopeless 3 0 0 0  PHQ - 2 Score 6 0 0 0  Altered sleeping 2 - - -  Tired, decreased energy 3 - - -  Change in appetite 3 - - -  Feeling bad or failure about yourself  3 - - -  Trouble concentrating 0 - - -  Moving slowly or fidgety/restless 0 - - -  Suicidal thoughts 0 - - -  PHQ-9 Score 17 - - -    Allergies  Allergen Reactions   Atorvastatin     myalgia   Rosuvastatin     myalgias   Statins Other (See Comments)    Myalgia, Achilles tendonitis   Adhesive [Tape] Rash    Sometimes peels skin off   Latex Rash    Sometimes peels skin off   Nickel Rash   Social History   Social History Narrative   Married, "[name]" [place]). 1 adult son [name]).    BA degree, Tree surgeon.    Drinks caffeine occasionally, uses herbal remedies.   Wears her seatbelt, smoke detector in the home.   Firearms in the home, locked cabinet.   Feels safe in her relationships.         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   Seasonal affective disorder [facility])    PMH of   Past Surgical History:  Procedure Laterality Date   ANAL FISTULOTOMY  2019   APPENDECTOMY  09/2020   CERVICAL BIOPSY  W/ LOOP ELECTRODE EXCISION  2002   LAPAROSCOPIC ASSISTED VAGINAL HYSTERECTOMY N/A 02/10/2014   Procedure: LAPAROSCOPIC ASSISTED VAGINAL HYSTERECTOMY;  Surgeon: Allena Katz, MD;  Location: [facility];  Service: Gynecology;  Laterality: N/A;    SALPINGOOPHORECTOMY Bilateral 02/10/2014   Procedure: SALPINGO OOPHORECTOMY;  Surgeon: Allena Katz, MD;  Location: [facility];  Service: Gynecology;  Laterality: Bilateral;   TOTAL HIP ARTHROPLASTY Right 10/07/2018   Procedure: TOTAL HIP ARTHROPLASTY ANTERIOR APPROACH;  Surgeon: Paralee Cancel, MD;  Location: [facility];  Service: Orthopedics;  Laterality: Right;  86 mins   Family History  Problem Relation Age of Onset   Diabetes Mother    Asthma Mother    Seasonal affective disorder Mother    Clotting disorder Mother        excess platelets   COPD Mother    Cancer Mother    Hyperlipidemia Mother    Stroke Mother    Kidney cancer Father    Hypertension Father    Cancer Father        kidney   Heart disease Father    Hyperlipidemia Maternal Grandmother    Stroke Maternal Grandmother 75       mini strokes   Pancreatic cancer Paternal Grandmother    Heart failure Paternal Grandmother    Heart failure Maternal Grandfather        MI @ 95   Alzheimer's disease Paternal Grandfather    Seasonal affective disorder Sister    GI problems Brother        GI bleed   Heart disease Paternal Uncle    Allergies as of 01/24/2021       Reactions   Atorvastatin    myalgia   Rosuvastatin    myalgias   Statins Other (See Comments)   Myalgia, Achilles tendonitis   Adhesive [tape] Rash   Sometimes peels skin off   Latex Rash   Sometimes peels skin off   Nickel Rash        Medication List        Accurate as of January 24, 2021 11:59 AM. If you have any questions, ask your nurse or doctor.          STOP taking these medications    calcium-vitamin D 500-200 MG-UNIT tablet Commonly known as: OSCAL WITH D Stopped by: Allison Pouch, DO       TAKE these medications    buPROPion 150 MG 24 hr tablet Commonly known as: Wellbutrin XL Take 1 tablet (150 mg total) by mouth daily.   ezetimibe 10 MG tablet Commonly known as: Zetia Take 1 tablet (10 mg total) by mouth daily.   metFORMIN  500 MG tablet Commonly known as: GLUCOPHAGE Take 1 tablet (500 mg total) by mouth 2 (two) times  daily with a meal.   Repatha SureClick 276 MG/ML Soaj Generic drug: Evolocumab Inject 140 mg into the skin every 14 (fourteen) days.   Synthroid 125 MCG tablet Generic drug: levothyroxine TAKE 1 TABLET DAILY BEFORE BREAKFAST   Vitamin D3 125 MCG (5000 UT) Tabs Take by mouth.        All past medical history, surgical history, allergies, family history, immunizations andmedications were updated in the EMR today and reviewed under the history and medication portions of their EMR.     ROS: Negative, with the exception of above mentioned in HPI   Objective:  BP 112/71   Pulse 68   Temp 98.1 F (36.7 C) (Oral)   Wt 229 lb (103.9 kg)   LMP 02/06/2012   SpO2 99%   BMI 33.82 kg/m  Body mass index is 33.82 kg/m. Gen: Afebrile. No acute distress. Nontoxic in appearance, well developed, well nourished.  HENT: AT. [place]. MMM, no oral lesions. Eyes:Pupils Equal Round Reactive to light, Extraocular movements intact,  Conjunctiva without redness, discharge or icterus. Neck/lymp/endocrine: Supple, no lymphadenopathy CV: RRR no murmur, no edema Chest: CTAB, no wheeze or crackles. Good air movement, normal resp effort.  Skin: No rashes, purpura or petechiae.  Tick bites x2 left lower extremity appreciated.  No erythema, no swelling. Neuro: Normal gait. PERLA. EOMi. Alert. Oriented x3  Psych: Normal affect, dress and demeanor. Normal speech. Normal thought content and judgment.  No results found. No results found. No results found for this or any previous visit (from the past 24 hour(s)).  Assessment/Plan: IMUNIQUE SAMAD is a 55 y.o. female present for OV for  fatigue Uncertain etiology of her sudden fatigue.  She does look rather tired today. -Consider sleep study -?  Repatha side effect or side effect from LDL less than 40? - Autoimmune panel 2017 was normal. -Consider adding iron  panel. - B. burgdorfi antibodies - Rocky mtn spotted fvr abs pnl(IgG+IgM) - CBC w/Diff - Comp Met (CMET) - B12 - Vitamin D (25 hydroxy)  Tick bite, unspecified site, initial encounter - B. burgdorfi antibodies - Rocky mtn spotted fvr abs pnl(IgG+IgM) - CBC w/Diff If titers are suspicious would elect to treat with Doxy  Vitamin D deficiency - Vitamin D (25 hydroxy)  Reviewed expectations re: course of current medical issues. Discussed self-management of symptoms. Outlined signs and symptoms indicating need for more acute intervention. Patient verbalized understanding and all questions were answered. Patient received an After-Visit Summary.    Orders Placed This Encounter  Procedures   B. burgdorfi antibodies   Rocky mtn spotted fvr abs pnl(IgG+IgM)   CBC w/Diff   Comp Met (CMET)    No orders of the defined types were placed in this encounter.  Referral Orders  No referral(s) requested today     Note is dictated utilizing voice recognition software. Although note has been proof read prior to signing, occasional typographical errors still can be missed. If any questions arise, please do not hesitate to call for verification.   electronically signed by:  Allison Pouch, DO  [facility]

## 2021-01-24 NOTE — Patient Instructions (Signed)
Tick Bite Information, Adult  Ticks are insects that can bite. Most ticks live in shrubs and grassy areas. They climb onto people and animals that go by. Then they bite. Some ticks carrygerms that can make you sick. How can I prevent tick bites? Take these steps: Use insect repellent Use an insect repellent that has 20% or higher of the ingredients DEET, picaridin, or IR3535. Follow the instructions on the label. Put it on: Bare skin. The tops of your boots. Your pant legs. The ends of your sleeves. If you use an insect repellent that has the ingredient permethrin, follow the instructions on the label. Put it on: Clothing. Boots. Supplies or outdoor gear. Tents. When you are outside Wear long sleeves and long pants. Wear light-colored clothes. Tuck your pant legs into your socks. Stay in the middle of the trail. Do not touch the bushes. Avoid walking through long grass. Check for ticks on your clothes, hair, and skin often while you are outside. Before going inside your house, check your clothes, skin, head, neck, armpits, waist, groin, and joint areas. When you go indoors Check your clothes for ticks. Dry your clothes in a dryer on high heat for 10 minutes or more. If clothes are damp, additional time may be needed. Wash your clothes right away if they need to be washed. Use hot water. Check your pets and outdoor gear. Shower right away. Check your body for ticks. Do a full body check using a mirror. What is the right way to remove a tick? Remove the tick from your skin as soon as possible. Do not remove the tick with your bare fingers. To remove a tick that is crawling on your skin: Go outdoors and brush the tick off. Use tape or a lint roller. To remove a tick that is biting: Wash your hands. If you have latex gloves, put them on. Use tweezers, curved forceps, or a tick-removal tool to grasp the tick. Grasp the tick as close to your skin and as close to the tick's head as  possible. Gently pull up until the tick lets go. Try to keep the tick's head attached to its body. Do not twist or jerk the tick. Do not squeeze or crush the tick. Do not try to remove a tick with heat, alcohol, petroleum jelly, or fingernail polish. What should I do after taking out a tick? Throw away the tick. Do not crush a tick with your fingers. Clean the bite area and your hands with soap and water, rubbing alcohol, or an iodine wash. If an antiseptic cream or ointment is available, apply a small amount to the bite area. Wash and disinfect any instruments that you used to remove the tick. How should I get rid of a live tick? To dispose of a live tick, use one of these methods: Place the tick in rubbing alcohol. Place the tick in a bag or container you can close tightly. Wrap the tick tightly in tape. Flush the tick down the toilet. Contact a doctor if: You have symptoms, such as: A fever or chills. A red rash that makes a circle (bull's-eye rash) in the bite area. Redness and swelling where the tick bit you. Headache. Pain in a muscle, joint, or bone. Being more tired than normal. Trouble walking or moving your legs. Numbness in your legs. Tender and swollen lymph glands. A part of a tick breaks off and gets stuck in your skin. Get help right away if: You cannot remove a   tick. You cannot move (have paralysis) or feel weak. You are feeling worse or have new symptoms. You find a tick that is biting you and filled with blood. This is important if you are in an area where diseases from ticks are common. Summary Ticks may carry germs that can make you sick. To prevent tick bites wear long sleeves, long pants, and light colors. Use insect repellent. Follow the instructions on the label. If the tick is biting, do not try to remove it with heat, alcohol, petroleum jelly, or fingernail polish. Use tweezers, curved forceps, or a tick-removal tool to grasp the tick. Gently pull up  until the tick lets go. Do not twist or jerk the tick. Do not squeeze or crush the tick. If you have symptoms, contact a doctor. This information is not intended to replace advice given to you by your health care provider. Make sure you discuss any questions you have with your healthcare provider. Document Revised: 07/27/2019 Document Reviewed: 07/27/2019 Elsevier Patient Education  2022 Elsevier Inc.  

## 2021-01-24 NOTE — Telephone Encounter (Signed)
*  STAT* If patient is at the pharmacy, call can be transferred to refill team.   1. Which medications need to be refilled? (please list name of each medication and dose if known)  Evolocumab (REPATHA SURECLICK) 116 MG/ML SOAJ  2. Which pharmacy/location (including street and city if local pharmacy) is medication to be sent to? [facility]  ([place], [address], [address]  3. Do they need a 30 day or 90 day supply? 90 with refills  Patient needs all future refills for this medication to go to the mail order service

## 2021-01-25 ENCOUNTER — Telehealth: Payer: Self-pay | Admitting: Family Medicine

## 2021-01-25 DIAGNOSIS — R5383 Other fatigue: Secondary | ICD-10-CM

## 2021-01-25 LAB — ROCKY MTN SPOTTED FVR ABS PNL(IGG+IGM)
RMSF IgG: NOT DETECTED
RMSF IgM: NOT DETECTED

## 2021-01-25 LAB — B. BURGDORFI ANTIBODIES: B burgdorferi Ab IgG+IgM: <0.9 index

## 2021-01-25 NOTE — Telephone Encounter (Signed)
Spoke with pt regarding labs and instructions.   

## 2021-01-25 NOTE — Telephone Encounter (Signed)
Please inform patient the following: Urine kidney functions are normal. Vitamin D levels are normal. B12 levels are normal. Blood cell counts are normal. Lyme titers and RMSF titers are negative.   I would like her to schedule a lab appointment only to have an iron panel collected since the above labs did not indicate cause of her fatigue.  If her iron panel comes back normal and she is still having fatigue, I would encourage her to follow-up in 2 to 4 weeks to pursue other possible etiologies of fatigue and repeat titers at that time.

## 2021-01-27 ENCOUNTER — Other Ambulatory Visit: Payer: Self-pay

## 2021-01-27 ENCOUNTER — Ambulatory Visit (INDEPENDENT_AMBULATORY_CARE_PROVIDER_SITE_OTHER): Payer: 59

## 2021-01-27 DIAGNOSIS — R5383 Other fatigue: Secondary | ICD-10-CM

## 2021-01-28 LAB — IRON,TIBC AND FERRITIN PANEL
%SAT: 13 % (calc) — ABNORMAL LOW (ref 16–45)
Ferritin: 146 ng/mL (ref 16–232)
Iron: 56 ug/dL (ref 45–160)
TIBC: 425 mcg/dL (calc) (ref 250–450)

## 2021-01-30 LAB — HM DIABETES EYE EXAM

## 2021-02-03 ENCOUNTER — Ambulatory Visit: Payer: 59

## 2021-02-11 ENCOUNTER — Encounter: Payer: Self-pay | Admitting: Family Medicine

## 2021-02-15 ENCOUNTER — Other Ambulatory Visit: Payer: Self-pay

## 2021-02-15 ENCOUNTER — Other Ambulatory Visit: Payer: Self-pay | Admitting: Family Medicine

## 2021-02-15 DIAGNOSIS — F321 Major depressive disorder, single episode, moderate: Secondary | ICD-10-CM

## 2021-02-15 MED ORDER — BUPROPION HCL ER (XL) 150 MG PO TB24: 150.0000 mg | ORAL_TABLET | Freq: Every day | ORAL | 0 refills | Status: DC

## 2021-02-15 MED ORDER — METFORMIN HCL 500 MG PO TABS: 500.0000 mg | ORAL_TABLET | Freq: Two times a day (BID) | ORAL | 0 refills | Status: DC

## 2021-02-16 ENCOUNTER — Other Ambulatory Visit: Payer: Self-pay | Admitting: Family Medicine

## 2021-02-16 DIAGNOSIS — F321 Major depressive disorder, single episode, moderate: Secondary | ICD-10-CM

## 2021-04-14 ENCOUNTER — Other Ambulatory Visit: Payer: Self-pay | Admitting: Internal Medicine

## 2021-04-16 NOTE — Progress Notes (Signed)
Cardiology Office Note:    Date:  04/19/2021   ID:  Allison Compton, DOB [birth year], MRN [id number]  PCP:  Ma Hillock, DO  Cardiologist:  None  Electrophysiologist:  None   Referring MD: Ma Hillock, DO   Chief Complaint  Patient presents with   Hyperlipidemia     History of Present Illness:    Allison Compton is a 55 y.o. female with a hx of hyperlipidemia, prediabetes who presents for follow-up.  She was referred by Dr. Raoul Pitch for evaluation of chest pain, initially seen on 07/19/2020.  She reports she started having chest pain in the last few weeks.  Has occurred mainly at night.  Describes left-sided sharp pain, 6-7 out of 10 in intensity.  Also reports left shoulder/arm pain.  Can last for up to 30 minutes.  Occurs about every 2 days.  For exercise, she walks her dog, denies any chest pain while doing this.  Denies any shortness of breath.  Also reports he started having lightheadedness when she turns her head certain ways.  Denies any syncope.  Does report mild chronic lower extremity edema.  She also reports around the same time of her chest pain she started having issues with lightheadedness when she turns her head in certain directions.  Reports this has resolved.  Denies any syncope.  Reports chronic mild lower extremity edema.  Reports palpitations when she drinks caffeine, which she has stopped.  She smoked from age [age], none since that time.  Family history includes significant hyperlipidemia and mother has TIAs.  She reports that she took atorvastatin for 1 year but developed myalgias and Achilles tendinitis.  Recently was prescribed Zetia but has not started yet.  Coronary CTA on 08/26/2020 showed nonobstructive CAD with noncalcified plaque in the proximal LAD causing mild (25 to 49%) stenosis, calcium score 0.  Echocardiogram on 08/11/2020 showed normal biventricular function, no significant valvular disease.  Since last clinic visit, she reports that she is doing well.   Denies any chest pain, dyspnea, lightheadedness, syncope, lower extremity edema, or palpitations.  Recently took a trip to [place] and went hiking at high altitude, denies any issues.   Wt Readings from Last 3 Encounters:  04/19/21 226 lb 12.8 oz (102.9 kg)  01/24/21 229 lb (103.9 kg)  01/19/21 225 lb 9.6 oz (102.3 kg)     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   DYSPLASIA, VAGINA 06/09/2007   Qualifier: History of  By: Adonis Housekeeper     Eczema    Edema of both legs    Elevated blood pressure reading    Fibroids    Hashimoto's disease    Hyperlipidemia    Pre-diabetes    Pt states she is "borderline" for yrs.   Seasonal affective disorder [facility])    PMH of    Past Surgical History:  Procedure Laterality Date   ANAL FISTULOTOMY  2019   APPENDECTOMY  09/2020   CERVICAL BIOPSY  W/ LOOP ELECTRODE EXCISION  2002   LAPAROSCOPIC ASSISTED VAGINAL HYSTERECTOMY N/A 02/10/2014   Procedure: LAPAROSCOPIC ASSISTED VAGINAL HYSTERECTOMY;  Surgeon: Allena Katz, MD;  Location: [facility];  Service: Gynecology;  Laterality: N/A;   SALPINGOOPHORECTOMY Bilateral 02/10/2014   Procedure: SALPINGO OOPHORECTOMY;  Surgeon: Allena Katz, MD;  Location: [facility];  Service: Gynecology;  Laterality: Bilateral;  TOTAL HIP ARTHROPLASTY Right 10/07/2018   Procedure: TOTAL HIP ARTHROPLASTY ANTERIOR APPROACH;  Surgeon: Paralee Cancel, MD;  Location: [facility];  Service: Orthopedics;  Laterality: Right;  70 mins    Current Medications: Current Meds  Medication Sig   buPROPion (WELLBUTRIN XL) 150 MG 24 hr tablet Take 1 tablet (150 mg total) by mouth daily.   Cholecalciferol (VITAMIN D3) 125 MCG (5000 UT) TABS Take by mouth.   Evolocumab (REPATHA SURECLICK) [id number] MG/ML SOAJ Inject 140 mg into the skin every 14 (fourteen) days.   metFORMIN (GLUCOPHAGE) 500 MG tablet Take 1 tablet (500 mg total) by mouth 2 (two)  times daily with a meal.   SYNTHROID 125 MCG tablet TAKE 1 TABLET DAILY BEFORE BREAKFAST   [DISCONTINUED] ezetimibe (ZETIA) 10 MG tablet Take 1 tablet (10 mg total) by mouth daily.     Allergies:   Atorvastatin, Rosuvastatin, Statins, Adhesive [tape], Latex, and Nickel   Social History   Socioeconomic History   Marital status: Married    Spouse name: Not on file   Number of children: Not on file   Years of education: Not on file   Highest education level: Not on file  Occupational History   Not on file  Tobacco Use   Smoking status: Former    Types: Cigarettes    Quit date: 08/13/1985    Years since quitting: 35.7   Smokeless tobacco: Never   Tobacco comments:    age [age], up to 1 ppd  Vaping Use   Vaping Use: Never used  Substance and Sexual Activity   Alcohol use: Not Currently   Drug use: No   Sexual activity: Never    Birth control/protection: Surgical  Other Topics Concern   Not on file  Social History Narrative   Married, "[place]). 1 adult son [name]).    BA degree, Tree surgeon.    Drinks caffeine occasionally, uses herbal remedies.   Wears her seatbelt, smoke detector in the home.   Firearms in the home, locked cabinet.   Feels safe in her relationships.         Epworth Sleepiness Scale = 6 (as of 05/09/2016)   Social Determinants of Health   Financial Resource Strain: Not on file  Food Insecurity: Not on file  Transportation Needs: Not on file  Physical Activity: Not on file  Stress: Not on file  Social Connections: Not on file     Family History: The patient's family history includes Alzheimer's disease in her paternal grandfather; Asthma in her mother; COPD in her mother; Cancer in her father and mother; Clotting disorder in her mother; Diabetes in her mother; GI problems in her brother; Heart disease in her father and paternal uncle; Heart failure in her maternal grandfather and paternal grandmother; Hyperlipidemia in her maternal  grandmother and mother; Hypertension in her father; Kidney cancer in her father; Pancreatic cancer in her paternal grandmother; Seasonal affective disorder in her mother and sister; Stroke in her mother; Stroke (age of onset: 46) in her maternal grandmother.  ROS:   Please see the history of present illness.     All other systems reviewed and are negative.  EKGs/Labs/Other Studies Reviewed:    The following studies were reviewed today:   EKG:  EKG is ordered today.  The ekg ordered demonstrates normal sinus rhythm, rate 76, nonspecific T wave flattening  Recent Labs: 01/19/2021: TSH 1.13 01/24/2021: ALT 18; BUN 16; Creatinine, Ser 0.84; Hemoglobin 13.3; Platelets 233.0; Potassium 4.4; Sodium 142  Recent  Lipid Panel    Component Value Date/Time   CHOL 107 01/19/2021 0945   CHOL 263 (H) 07/17/2019 0000   TRIG 145.0 01/19/2021 0945   HDL 60.30 01/19/2021 0945   HDL 58 07/17/2019 0000   CHOLHDL 2 01/19/2021 0945   VLDL 29.0 01/19/2021 0945   LDLCALC 18 01/19/2021 0945   LDLCALC 182 (H) 07/17/2019 0000   LDLDIRECT 139.9 02/17/2008 0852    Physical Exam:    VS:  BP 114/70   Pulse 76   Ht 5' 8.5" (1.74 m)   Wt 226 lb 12.8 oz (102.9 kg)   LMP 02/06/2012   SpO2 96%   BMI 33.98 kg/m     Wt Readings from Last 3 Encounters:  04/19/21 226 lb 12.8 oz (102.9 kg)  01/24/21 229 lb (103.9 kg)  01/19/21 225 lb 9.6 oz (102.3 kg)     GEN: Well nourished, well developed in no acute distress HEENT: Normal NECK: No JVD; No carotid bruits CARDIAC: RRR, no murmurs, rubs, gallops RESPIRATORY:  Clear to auscultation without rales, wheezing or rhonchi  ABDOMEN: Soft, non-tender, non-distended MUSCULOSKELETAL:  No edema; No deformity  SKIN: Warm and dry NEUROLOGIC:  Alert and oriented x 3 PSYCHIATRIC:  Normal affect   ASSESSMENT:    1. Chest pain of uncertain etiology   2. Hyperlipidemia, unspecified hyperlipidemia type     PLAN:    Chest pain: Atypical in description but does have  CAD risk factors (hyperlipidemia, T2DM).  Coronary CTA on 08/26/2020 showed nonobstructive CAD with noncalcified plaque in the proximal LAD causing mild (25 to 49%) stenosis, calcium score 0.  Echocardiogram on 08/11/2020 showed normal biventricular function, no significant valvular disease. -Continue Repatha  Hyperlipidemia:  LDL 160 on 06/03/2020.  Unable to tolerate atorvastatin due to myalgias/Achilles tendinitis.  Started on Zetia 10 mg daily, LDL improved to 120 on 10/13/2020.  Low-dose rosuvastatin was added, but unable to tolerate.  She was referred to lipid clinic and started on Repatha.  LDL 18 on 01/19/2021.  Can stop zetia and will plan repeat lipid panel in 3 months.   T2DM: A1c 6.5% on 01/19/2021.  Started on Metformin  RTC in 1 year   Medication Adjustments/Labs and Tests Ordered: Current medicines are reviewed at length with the patient today.  Concerns regarding medicines are outlined above.  Orders Placed This Encounter  Procedures   Lipid panel   EKG 12-Lead    No orders of the defined types were placed in this encounter.   Patient Instructions  Medication Instructions:  STOP Zetia  *If you need a refill on your cardiac medications before your next appointment, please call your pharmacy*   Lab Work: Please return for FASTING labs in 3 months-December. (Lipid)  Our in office lab hours are Monday-Friday 8:00-4:00, closed for lunch 12:45-1:45 pm.  No appointment needed.  Follow-Up: At [facility], you and your health needs are our priority.  As part of our continuing mission to provide you with exceptional heart care, we have created designated Provider Care Teams.  These Care Teams include your primary Cardiologist (physician) and Advanced Practice Providers (APPs -  Physician Assistants and Nurse Practitioners) who all work together to provide you with the care you need, when you need it.  We recommend signing up for the patient portal called "MyChart".  Sign up  information is provided on this After Visit Summary.  MyChart is used to connect with patients for Virtual Visits (Telemedicine).  Patients are able to view lab/test results, encounter  notes, upcoming appointments, etc.  Non-urgent messages can be sent to your provider as well.   To learn more about what you can do with MyChart, go to [email or web address].    Your next appointment:   12 month(s)  The format for your next appointment:   In Person  Provider:   Oswaldo Milian, MD     Signed, Donato Heinz, MD  04/19/2021 3:51 PM    [place]

## 2021-04-19 ENCOUNTER — Encounter: Payer: Self-pay | Admitting: Cardiology

## 2021-04-19 ENCOUNTER — Other Ambulatory Visit: Payer: Self-pay

## 2021-04-19 ENCOUNTER — Ambulatory Visit (INDEPENDENT_AMBULATORY_CARE_PROVIDER_SITE_OTHER): Payer: 59 | Admitting: Cardiology

## 2021-04-19 VITALS — BP 114/70 | HR 76 | Ht 68.5 in | Wt 226.8 lb

## 2021-04-19 DIAGNOSIS — E785 Hyperlipidemia, unspecified: Secondary | ICD-10-CM

## 2021-04-19 DIAGNOSIS — R079 Chest pain, unspecified: Secondary | ICD-10-CM | POA: Diagnosis not present

## 2021-04-19 NOTE — Patient Instructions (Signed)
Medication Instructions:  STOP Zetia  *If you need a refill on your cardiac medications before your next appointment, please call your pharmacy*   Lab Work: Please return for FASTING labs in 3 months-December. (Lipid)  Our in office lab hours are Monday-Friday 8:00-4:00, closed for lunch 12:45-1:45 pm.  No appointment needed.  Follow-Up: At [facility], you and your health needs are our priority.  As part of our continuing mission to provide you with exceptional heart care, we have created designated Provider Care Teams.  These Care Teams include your primary Cardiologist (physician) and Advanced Practice Providers (APPs -  Physician Assistants and Nurse Practitioners) who all work together to provide you with the care you need, when you need it.  We recommend signing up for the patient portal called "MyChart".  Sign up information is provided on this After Visit Summary.  MyChart is used to connect with patients for Virtual Visits (Telemedicine).  Patients are able to view lab/test results, encounter notes, upcoming appointments, etc.  Non-urgent messages can be sent to your provider as well.   To learn more about what you can do with MyChart, go to [email or web address].    Your next appointment:   12 month(s)  The format for your next appointment:   In Person  Provider:   Oswaldo Milian, MD

## 2021-04-23 ENCOUNTER — Other Ambulatory Visit: Payer: Self-pay | Admitting: Family Medicine

## 2021-04-23 DIAGNOSIS — F321 Major depressive disorder, single episode, moderate: Secondary | ICD-10-CM

## 2021-05-13 ENCOUNTER — Other Ambulatory Visit: Payer: Self-pay | Admitting: Family Medicine

## 2021-05-22 DIAGNOSIS — M7062 Trochanteric bursitis, left hip: Secondary | ICD-10-CM | POA: Insufficient documentation

## 2021-06-01 ENCOUNTER — Other Ambulatory Visit: Payer: Self-pay | Admitting: Internal Medicine

## 2021-06-05 ENCOUNTER — Telehealth: Payer: Self-pay

## 2021-06-05 ENCOUNTER — Other Ambulatory Visit: Payer: Self-pay | Admitting: Family Medicine

## 2021-06-05 ENCOUNTER — Other Ambulatory Visit: Payer: Self-pay

## 2021-06-05 DIAGNOSIS — F321 Major depressive disorder, single episode, moderate: Secondary | ICD-10-CM

## 2021-06-05 MED ORDER — METFORMIN HCL 500 MG PO TABS: 500.0000 mg | ORAL_TABLET | Freq: Two times a day (BID) | ORAL | 0 refills | Status: DC

## 2021-06-05 MED ORDER — BUPROPION HCL ER (XL) 150 MG PO TB24: 150.0000 mg | ORAL_TABLET | Freq: Every day | ORAL | 0 refills | Status: DC

## 2021-06-05 MED ORDER — METFORMIN HCL 500 MG PO TABS
500.0000 mg | ORAL_TABLET | Freq: Two times a day (BID) | ORAL | 0 refills | Status: DC
  Filled 2021-06-05: qty 60, 30d supply, fill #0

## 2021-06-05 MED ORDER — BUPROPION HCL ER (XL) 150 MG PO TB24
150.0000 mg | ORAL_TABLET | Freq: Every day | ORAL | 0 refills | Status: DC
  Filled 2021-06-05: qty 30, 30d supply, fill #0

## 2021-06-05 NOTE — Telephone Encounter (Signed)
Rx re-sent as requested.

## 2021-06-05 NOTE — Telephone Encounter (Signed)
Patient called regarding refills any they were not approved for refill.  I told patient a 30 d/s was called in however an office visit is required for any future fills. Patient scheduled appt for 06/14/21 with Dr. Raoul Pitch.  Prescriptions were called into incorrect pharmacy.  Patient states she has never used this pharmacy before for any of her meds. Please correct and send 30 d/s to [facility]  Rx #: [id number]  buPROPion (WELLBUTRIN XL) 150 MG 24 hr tablet [[id number]]   Rx #: [id number]  metFORMIN (GLUCOPHAGE) 500 MG tablet [[id number]]

## 2021-06-12 ENCOUNTER — Telehealth: Payer: Self-pay | Admitting: Family Medicine

## 2021-06-12 NOTE — Telephone Encounter (Signed)
Pt called and wanted to know if sher can come in prior to appt on Wednesday to get lab work done so it can be discussed at the appt, Is this okay?

## 2021-06-12 NOTE — Telephone Encounter (Signed)
Spoke with pt and informed her that we will draw all labs during OV

## 2021-06-14 ENCOUNTER — Other Ambulatory Visit: Payer: Self-pay

## 2021-06-14 ENCOUNTER — Encounter: Payer: Self-pay | Admitting: Family Medicine

## 2021-06-14 ENCOUNTER — Ambulatory Visit (INDEPENDENT_AMBULATORY_CARE_PROVIDER_SITE_OTHER): Payer: 59 | Admitting: Family Medicine

## 2021-06-14 VITALS — BP 105/71 | HR 67 | Temp 97.8°F | Ht 69.0 in | Wt 229.0 lb

## 2021-06-14 DIAGNOSIS — E119 Type 2 diabetes mellitus without complications: Secondary | ICD-10-CM

## 2021-06-14 DIAGNOSIS — Z8249 Family history of ischemic heart disease and other diseases of the circulatory system: Secondary | ICD-10-CM

## 2021-06-14 DIAGNOSIS — E782 Mixed hyperlipidemia: Secondary | ICD-10-CM | POA: Diagnosis not present

## 2021-06-14 DIAGNOSIS — E785 Hyperlipidemia, unspecified: Secondary | ICD-10-CM

## 2021-06-14 DIAGNOSIS — F321 Major depressive disorder, single episode, moderate: Secondary | ICD-10-CM | POA: Diagnosis not present

## 2021-06-14 DIAGNOSIS — E1169 Type 2 diabetes mellitus with other specified complication: Secondary | ICD-10-CM | POA: Diagnosis not present

## 2021-06-14 DIAGNOSIS — E063 Autoimmune thyroiditis: Secondary | ICD-10-CM

## 2021-06-14 DIAGNOSIS — E038 Other specified hypothyroidism: Secondary | ICD-10-CM | POA: Diagnosis not present

## 2021-06-14 DIAGNOSIS — E559 Vitamin D deficiency, unspecified: Secondary | ICD-10-CM

## 2021-06-14 LAB — POCT GLYCOSYLATED HEMOGLOBIN (HGB A1C)
HbA1c POC (<> result, manual entry): 6.3 % (ref 4.0–5.6)
HbA1c, POC (controlled diabetic range): 6.3 % (ref 0.0–7.0)
HbA1c, POC (prediabetic range): 6.3 % (ref 5.7–6.4)
Hemoglobin A1C: 6.3 % — AB (ref 4.0–5.6)

## 2021-06-14 MED ORDER — BUPROPION HCL ER (XL) 150 MG PO TB24: 150.0000 mg | ORAL_TABLET | Freq: Every day | ORAL | 1 refills | Status: DC

## 2021-06-14 MED ORDER — METFORMIN HCL 500 MG PO TABS: 500.0000 mg | ORAL_TABLET | Freq: Two times a day (BID) | ORAL | 1 refills | Status: DC

## 2021-06-14 NOTE — Patient Instructions (Addendum)
  Great to see you today.  I have refilled the medication(s) we provide.   If labs were collected, we will inform you of lab results once received either by echart message or telephone call.   - echart message- for normal results that have been seen by the patient already.   - telephone call: abnormal results or if patient has not viewed results in their echart.  Chronic condition appt in April.

## 2021-06-14 NOTE — Progress Notes (Signed)
This visit occurred during the SARS-CoV-2 public health emergency.  Safety protocols were in place, including screening questions prior to the visit, additional usage of staff PPE, and extensive cleaning of exam room while observing appropriate contact time as indicated for disinfecting solutions.    Allison Compton , 07-27-1966, 55 y.o., female MRN: [id number] Patient Care Team    Relationship Specialty Notifications Start End  Ma Hillock, DO PCP - General Family Medicine  12/22/15   Philemon Kingdom, MD Consulting Physician Internal Medicine  12/23/15    Comment: prediabetes/thyroid    Chief Complaint  Patient presents with   Diabetes    [place]; pt is not fasting      Subjective: [name] is a 55 y.o. Pt presents for an OV to follow-up on chronic medical conditions. Depression, major, single episode, moderate (HCC)/Hot flashes Patient reports she feels the Wellbutrin 150 mg daily is very helpful. She denies any side effects.  Mixed hyperlipidemia/Family history of heart disease Patient reports she is tolerating repatha by cardio.   Hypothyroidism due to Hashimoto's thyroiditis Managed by endocrine.  Compliant with Synthroid 125 mcg daily  Diabetes w/ HLD Pt reports compliance with metformin 500 at least qd- sometimes BID. Denies numbness, tingling of extremities, hypo/hyperglycemic events or non-healing wounds.   Vitamin D deficiency Reports she has not been consistent with taking her vitamin D supplement  Cardiac CT 08/2020: IMPRESSION: 1. Coronary calcium score of 0. 2. Normal coronary origin with right dominance. 3. Nonobstructive CAD with noncalcified plaque in the proximal LAD causing mild (25-49%) stenosis Depression screen [facility] 2/9 06/14/2021 06/16/2020 09/02/2019 06/14/2017 12/22/2015  Decreased Interest 0 3 0 0 0  Down, Depressed, Hopeless 0 3 0 0 0  PHQ - 2 Score 0 6 0 0 0  Altered sleeping 1 2 - - -  Tired, decreased energy 1 3 - - -  Change in  appetite 2 3 - - -  Feeling bad or failure about yourself  0 3 - - -  Trouble concentrating 0 0 - - -  Moving slowly or fidgety/restless 0 0 - - -  Suicidal thoughts 0 0 - - -  PHQ-9 Score 4 17 - - -   GAD 7 : Generalized Anxiety Score 06/14/2021 06/16/2020  Nervous, Anxious, on Edge 0 0  Control/stop worrying 0 0  Worry too much - different things 0 0  Trouble relaxing 1 2  Restless 0 0  Easily annoyed or irritable 1 0  Afraid - awful might happen 0 0  Total GAD 7 Score 2 2    Allergies  Allergen Reactions   Atorvastatin     myalgia   Rosuvastatin     myalgias   Statins Other (See Comments)    Myalgia, Achilles tendonitis   Adhesive [Tape] Rash    Sometimes peels skin off   Latex Rash    Sometimes peels skin off   Nickel Rash   Social History   Social History Narrative   Married, "Allison" Compton). 1 adult son Allison Compton).    BA degree, Tree surgeon.    Drinks caffeine occasionally, uses herbal remedies.   Wears her seatbelt, smoke detector in the home.   Firearms in the home, locked cabinet.   Feels safe in her relationships.         Epworth Sleepiness Scale = 6 (as of 05/09/2016)   Past Medical History:  Diagnosis Date   Acute appendicitis 09/24/2020   Anal tear  Borderline abnormal TFTs    due to soy nut ingestion   DYSPLASIA, VAGINA 06/09/2007   Qualifier: History of  By: Adonis Housekeeper     Eczema    Edema of both legs    Elevated blood pressure reading    Fibroids    Hashimoto's disease    Hyperlipidemia    Pre-diabetes    Pt states she is "borderline" for yrs.   Rectal hemorrhage 05/17/2017   Seasonal affective disorder [facility])    PMH of   Past Surgical History:  Procedure Laterality Date   ANAL FISTULOTOMY  2019   APPENDECTOMY  09/2020   CERVICAL BIOPSY  W/ LOOP ELECTRODE EXCISION  2002   LAPAROSCOPIC ASSISTED VAGINAL HYSTERECTOMY N/A 02/10/2014   Procedure: LAPAROSCOPIC ASSISTED VAGINAL HYSTERECTOMY;  Surgeon: Allena Katz, MD;  Location:  [place] [facility];  Service: Gynecology;  Laterality: N/A;   SALPINGOOPHORECTOMY Bilateral 02/10/2014   Procedure: SALPINGO OOPHORECTOMY;  Surgeon: Allena Katz, MD;  Location: [place] [facility];  Service: Gynecology;  Laterality: Bilateral;   TOTAL HIP ARTHROPLASTY Right 10/07/2018   Procedure: TOTAL HIP ARTHROPLASTY ANTERIOR APPROACH;  Surgeon: Paralee Cancel, MD;  Location: [facility];  Service: Orthopedics;  Laterality: Right;  15 mins   Family History  Problem Relation Age of Onset   Diabetes Mother    Asthma Mother    Seasonal affective disorder Mother    Clotting disorder Mother        excess platelets   COPD Mother    Cancer Mother    Hyperlipidemia Mother    Stroke Mother    Kidney cancer Father    Hypertension Father    Cancer Father        kidney   Heart disease Father    Hyperlipidemia Maternal Grandmother    Stroke Maternal Grandmother 75       mini strokes   Pancreatic cancer Paternal Grandmother    Heart failure Paternal Grandmother    Heart failure Maternal Grandfather        MI @ 53   Alzheimer's disease Paternal Grandfather    Seasonal affective disorder Sister    GI problems Brother        GI bleed   Heart disease Paternal Uncle    Allergies as of 06/14/2021       Reactions   Atorvastatin    myalgia   Rosuvastatin    myalgias   Statins Other (See Comments)   Myalgia, Achilles tendonitis   Adhesive [tape] Rash   Sometimes peels skin off   Latex Rash   Sometimes peels skin off   Nickel Rash        Medication List        Accurate as of June 14, 2021  4:14 PM. If you have any questions, ask your nurse or doctor.          buPROPion 150 MG 24 hr tablet Commonly known as: Wellbutrin XL Take 1 tablet (150 mg total) by mouth daily.   metFORMIN 500 MG tablet Commonly known as: GLUCOPHAGE Take 1 tablet (500 mg total) by mouth 2 (two) times daily with a meal.   Repatha SureClick 944 MG/ML Soaj Generic drug: Evolocumab Inject 140 mg into the skin every 14  (fourteen) days.   Synthroid 125 MCG tablet Generic drug: levothyroxine TAKE 1 TABLET DAILY BEFORE BREAKFAST   Vitamin D3 125 MCG (5000 UT) Tabs Take by mouth.        All past medical history, surgical history,  allergies, family history, immunizations andmedications were updated in the EMR today and reviewed under the history and medication portions of their EMR.     ROS: Negative, with the exception of above mentioned in HPI   Objective:  BP 105/71   Pulse 67   Temp 97.8 F (36.6 C) (Oral)   Ht 5\' 9"  (1.753 m)   Wt 229 lb (103.9 kg)   LMP 02/06/2012   SpO2 97%   BMI 33.82 kg/m  Body mass index is 33.82 kg/m. Gen: Afebrile. No acute distress. Nontoxic, pleasant obese female.  HENT: AT. . MMM.no cough.  Eyes:Pupils Equal Round Reactive to light, Extraocular movements intact,  Conjunctiva without redness, discharge or icterus. Neck/lymp/endocrine: Supple,no lymphadenopathy CV: RRR no murmur, no edema, +2/4 P posterior tibialis pulses Chest: CTAB, no wheeze or crackles Skin: no rashes, purpura or petechiae.  Neuro:  Normal gait. PERLA. EOMi. Alert. Oriented x3 Psych: Normal affect, dress and demeanor. Normal speech. Normal thought content and judgment.    No results found. No results found. Results for orders placed or performed in visit on 06/14/21 (from the past 24 hour(s))  POCT HgB A1C     Status: Abnormal   Collection Time: 06/14/21  3:52 PM  Result Value Ref Range   Hemoglobin A1C 6.3 (A) 4.0 - 5.6 %   HbA1c POC (<> result, manual entry) 6.3 4.0 - 5.6 %   HbA1c, POC (prediabetic range) 6.3 5.7 - 6.4 %   HbA1c, POC (controlled diabetic range) 6.3 0.0 - 7.0 %    Assessment/Plan: Allison Compton is a 55 y.o. female present for OV for [facility] Depression, major, single episode, moderate (HCC)/Hot flashes Stable.  Continue  Wellbutrin 150 mg daily Follow-up 5.5 months, sooner if needed  Mixed hyperlipidemia/Family history of heart disease Stable. Dr. Gardiner Rhyme  started repatha. LDL was extremely low> dcd zetia and left repatha now. She is due for recheck soon with cardio.   Hypothyroidism due to Hashimoto's thyroiditis Managed by endocrine.   Diabetes w/ HLD:  Last A1c 6.8> 6.7> 6.5> 6.3 Continue metformin 1000 mg total daily.  - microalb. Collected today PNA series: declined today( will get next time) Flu shot: declined (recommneded yearly) Foot exam: 06/14/2021 Eye exam: 01/30/2021  Vitamin D deficiency Labs UTD - Vitamin D (25 hydroxy)    Reviewed expectations re: course of current medical issues. Discussed self-management of symptoms. Outlined signs and symptoms indicating need for more acute intervention. Patient verbalized understanding and all questions were answered. Patient received an After-Visit Summary.    Orders Placed This Encounter  Procedures   Microalbumin / creatinine urine ratio   POCT HgB A1C    Meds ordered this encounter  Medications   buPROPion (WELLBUTRIN XL) 150 MG 24 hr tablet    Sig: Take 1 tablet (150 mg total) by mouth daily.    Dispense:  90 tablet    Refill:  1   metFORMIN (GLUCOPHAGE) 500 MG tablet    Sig: Take 1 tablet (500 mg total) by mouth 2 (two) times daily with a meal.    Dispense:  180 tablet    Refill:  1    Referral Orders  No referral(s) requested today     Note is dictated utilizing voice recognition software. Although note has been proof read prior to signing, occasional typographical errors still can be missed. If any questions arise, please do not hesitate to call for verification.   electronically signed by:  Howard Pouch, DO  [facility] -  OR

## 2021-06-15 LAB — MICROALBUMIN / CREATININE URINE RATIO
Creatinine,U: 74.1 mg/dL
Microalb Creat Ratio: 0.9 mg/g (ref 0.0–30.0)
Microalb, Ur: <0.7 mg/dL (ref 0.0–1.9)

## 2021-07-26 ENCOUNTER — Other Ambulatory Visit: Payer: Self-pay

## 2021-07-26 ENCOUNTER — Ambulatory Visit (INDEPENDENT_AMBULATORY_CARE_PROVIDER_SITE_OTHER): Payer: 59 | Admitting: Internal Medicine

## 2021-07-26 ENCOUNTER — Encounter: Payer: Self-pay | Admitting: Internal Medicine

## 2021-07-26 VITALS — BP 108/58 | HR 65 | Ht 69.0 in | Wt 230.2 lb

## 2021-07-26 DIAGNOSIS — E063 Autoimmune thyroiditis: Secondary | ICD-10-CM | POA: Diagnosis not present

## 2021-07-26 DIAGNOSIS — E785 Hyperlipidemia, unspecified: Secondary | ICD-10-CM

## 2021-07-26 DIAGNOSIS — E038 Other specified hypothyroidism: Secondary | ICD-10-CM

## 2021-07-26 DIAGNOSIS — E559 Vitamin D deficiency, unspecified: Secondary | ICD-10-CM

## 2021-07-26 DIAGNOSIS — E119 Type 2 diabetes mellitus without complications: Secondary | ICD-10-CM | POA: Diagnosis not present

## 2021-07-26 LAB — LIPID PANEL
Cholesterol: 160 mg/dL (ref 0–200)
HDL: 60 mg/dL (ref >39.00)
LDL Cholesterol: 79 mg/dL (ref 0–99)
NonHDL: 99.69
Total CHOL/HDL Ratio: 3
Triglycerides: 101 mg/dL (ref 0.0–149.0)
VLDL: 20.2 mg/dL (ref 0.0–40.0)

## 2021-07-26 LAB — VITAMIN D 25 HYDROXY (VIT D DEFICIENCY, FRACTURES): VITD: 39.13 ng/mL (ref 30.00–100.00)

## 2021-07-26 LAB — TSH: TSH: 1.66 u[IU]/mL (ref 0.35–5.50)

## 2021-07-26 LAB — T4, FREE: Free T4: 1.13 ng/dL (ref 0.60–1.60)

## 2021-07-26 NOTE — Progress Notes (Signed)
Patient ID: Allison Compton, female   DOB: [birth year], 55 y.o.   MRN: [id number]  This visit occurred during the SARS-CoV-2 public health emergency.  Safety protocols were in place, including screening questions prior to the visit, additional usage of staff PPE, and extensive cleaning of exam room while observing appropriate contact time as indicated for disinfecting solutions.   HPI  Allison Compton is a 55 y.o.-year-old female, returning for f/u for Hashimoto's hypothyroidism and prediabetes. Last visit 6 months ago.  Interim history: She is starting to change her diet in the last week: gluten free, lower carb diet.  She feels better. She sees a Audiological scientist - through work. She had Covid19 in July  - mild. She continues on Wellbutrin for depression.  She also got a new puppy.  Pt. was dx with hypothyroidism in 04/2015.  She is on Synthroid d.a.w. 125 mcg daily: - in am - fasting - at least 30 min from b'fast - no calcium - no iron - no multivitamins - no PPIs - not on Biotin  Reviewed her TFTs: Lab Results  Component Value Date   TSH 1.13 01/19/2021   TSH 1.53 06/03/2020   TSH 3.93 07/17/2019   TSH 2.14 06/13/2018   TSH 3.57 01/07/2018   TSH 0.90 05/17/2017   TSH 0.81 11/20/2016   TSH 1.74 07/27/2016   TSH 6.17 (H) 05/28/2016   TSH 4.93 (H) 03/13/2016   FREET4 1.26 01/19/2021   FREET4 1.03 06/03/2020   FREET4 0.99 07/17/2019   FREET4 1.04 06/13/2018   FREET4 0.99 01/07/2018   FREET4 1.5 05/17/2017   FREET4 1.28 11/20/2016   FREET4 1.29 07/27/2016   FREET4 1.02 05/28/2016   FREET4 0.92 03/13/2016    She has Hashimoto's thyroiditis Component     Latest Ref Rng & Units 01/07/2018 06/13/2018  Thyroperoxidase Ab SerPl-aCnc     <9 IU/mL 96 (H) 139 (H)  Thyroglobulin Ab     < or = 1 IU/mL 1 <1   Previously: Component     Latest Ref Rng & Units 05/30/2015 05/28/2016 11/20/2016  Thyroperoxidase Ab SerPl-aCnc     <9 IU/mL 782 (H) 652 (H) 328 (H)  Thyroglobulin  Ab     <2 IU/mL 2 (H) 1   Previously on low-dose selenium.  Pt denies: - feeling nodules in neck - hoarseness - dysphagia - choking - SOB with lying down  She has + FH of thyroid disorders in: several cousins. No FH of thyroid cancer. No h/o radiation tx to head or neck.  No herbal supplements. No Biotin use. No recent steroids use.   DM2: -New diagnosis -10/13/2020 -She was started on metformin 500 mg twice a day by PCP in 10/2020.  She tolerates this well.  Reviewed HbA1c levels: Lab Results  Component Value Date   HGBA1C 6.3 (A) 06/14/2021   HGBA1C 6.3 06/14/2021   HGBA1C 6.3 06/14/2021   HGBA1C 6.3 06/14/2021   HGBA1C 6.5 (A) 01/19/2021   HGBA1C 6.7 (H) 10/13/2020   HGBA1C 6.8 (H) 06/03/2020   HGBA1C 6.4 07/17/2019   HGBA1C 6.0 (H) 09/26/2018   HGBA1C 6.3 06/13/2018   HGBA1C 6.4 01/07/2018   HGBA1C 5.8 (H) 05/17/2017   HGBA1C 6.2 11/20/2016   HGBA1C 6.0 05/28/2016   HGBA1C 6.3 12/19/2015   HGBA1C 6.0 09/30/2015   HGBA1C 5.4 04/19/2015   HGBA1C 6.3 02/26/2014   HGBA1C 6.2 10/17/2011   HGBA1C 5.7 08/31/2009   No CKD: Lab Results  Component Value Date   BUN 16  01/24/2021   Lab Results  Component Value Date   CREATININE 0.84 01/24/2021   No MAU: Lab Results  Component Value Date   MICRALBCREAT 0.9 06/14/2021   MICRALBCREAT 1.3 07/17/2019   MICRALBCREAT 21.5 09/29/2008   MICRALBCREAT 2.0 02/17/2008   MICRALBCREAT 3.9 05/29/2007   + HL: Lab Results  Component Value Date   CHOL 107 01/19/2021   HDL 60.30 01/19/2021   LDLCALC 18 01/19/2021   LDLDIRECT 139.9 02/17/2008   TRIG 145.0 01/19/2021   CHOLHDL 2 01/19/2021  We tried to start Atorvastatin but she developed mm aches and Achilles tendinitis (had to have sx) and we stopped.  Currently on Repatha but stopped ezetimibe - per the Lipid clinic.  In the past, she was on a gluten and dairy free diet and her weight and TPO antibodies improved.  Afterwards, she tried a keto diet but TPO antibodies were  sent.  She has a history of vitamin D deficiency: Lab Results  Component Value Date   VD25OH 45.64 01/24/2021   VD25OH 30.06 10/13/2020   VD25OH 26.99 (L) 06/03/2020   VD25OH 27.13 (L) 07/17/2019   VD25OH 36 05/17/2017   VD25OH 48.22 11/20/2016   VD25OH 25.96 (L) 05/28/2016   She is on a vitamin D supplement: 5000 units daily.  She also has a history of hysterectomy in 2015. She had THR in 09-29-18. She had a calcium score of 0 in 08/2020. She had appendicitis 09/24/2020 and had to have laparoscopic appendectomy.  Her husband passed away in 09/29/20 from metastatic prostate cancer.  ROS: + see HPI  I reviewed pt's medications, allergies, PMH, social hx, family hx, and changes were documented in the history of present illness. Otherwise, unchanged from my initial visit note.  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   Rectal hemorrhage 05/17/2017   Seasonal affective disorder [facility])    PMH of   Past Surgical History:  Procedure Laterality Date   ANAL FISTULOTOMY  2019   APPENDECTOMY  29-Sep-2020   CERVICAL BIOPSY  W/ LOOP ELECTRODE EXCISION  2002   LAPAROSCOPIC ASSISTED VAGINAL HYSTERECTOMY N/A 02/10/2014   Procedure: LAPAROSCOPIC ASSISTED VAGINAL HYSTERECTOMY;  Surgeon: Allena Katz, MD;  Location: [place] [facility];  Service: Gynecology;  Laterality: N/A;   SALPINGOOPHORECTOMY Bilateral 02/10/2014   Procedure: SALPINGO OOPHORECTOMY;  Surgeon: Allena Katz, MD;  Location: [place] [facility];  Service: Gynecology;  Laterality: Bilateral;   TOTAL HIP ARTHROPLASTY Right 10/07/2018   Procedure: TOTAL HIP ARTHROPLASTY ANTERIOR APPROACH;   Surgeon: Paralee Cancel, MD;  Location: [facility];  Service: Orthopedics;  Laterality: Right;  70 mins   Social History   Social History   Marital Status: Married    Spouse Name: N/A   Number of Children: 1   Occupational History   n/a   Social History Main Topics   Smoking status: Former Smoker    Quit date: 08/13/1985   Smokeless tobacco: Not on file     Comment: age [age], up to 1 ppd   Alcohol  Use: Yes     Comment: rarely once a month or less   Drug Use: No   Current Outpatient Medications on File Prior to Visit  Medication Sig Dispense Refill   buPROPion (WELLBUTRIN XL) 150 MG 24 hr tablet Take 1 tablet (150 mg total) by mouth daily. 90 tablet 1   Cholecalciferol (VITAMIN D3) 125 MCG (5000 UT) TABS Take by mouth.     Evolocumab (REPATHA SURECLICK) 546 MG/ML SOAJ Inject 140 mg into the skin every 14 (fourteen) days. 2 mL 11   metFORMIN (GLUCOPHAGE) 500 MG tablet Take 1 tablet (500 mg total) by mouth 2 (two) times daily with a meal. 180 tablet 1   SYNTHROID 125 MCG tablet TAKE 1 TABLET DAILY BEFORE BREAKFAST 90 tablet 0   No current facility-administered medications on file prior to visit.   Allergies  Allergen Reactions   Atorvastatin     myalgia   Rosuvastatin     myalgias   Statins Other (See Comments)    Myalgia, Achilles tendonitis   Adhesive [Tape] Rash    Sometimes peels skin off   Latex Rash    Sometimes peels skin off   Nickel Rash   Family History  Problem Relation Age of Onset   Diabetes Mother    Asthma Mother    Seasonal affective disorder Mother    Clotting disorder Mother        excess platelets   COPD Mother    Cancer Mother    Hyperlipidemia Mother    Stroke Mother    Kidney cancer Father    Hypertension Father    Cancer Father        kidney   Heart disease Father    Hyperlipidemia Maternal Grandmother    Stroke Maternal Grandmother 17       mini strokes   Pancreatic cancer Paternal Grandmother    Heart failure Paternal Grandmother     Heart failure Maternal Grandfather        MI @ 30   Alzheimer's disease Paternal Grandfather    Seasonal affective disorder Sister    GI problems Brother        GI bleed   Heart disease Paternal Uncle    PE: BP (!) 108/58 (BP Location: Right Arm, Patient Position: Sitting, Cuff Size: Normal)    Pulse 65    Ht 5\' 9"  (1.753 m)    Wt 230 lb 3.2 oz (104.4 kg)    LMP 02/06/2012    SpO2 97%    BMI 33.99 kg/m  Wt Readings from Last 3 Encounters:  07/26/21 230 lb 3.2 oz (104.4 kg)  06/14/21 229 lb (103.9 kg)  04/19/21 226 lb 12.8 oz (102.9 kg)   Constitutional: overweight, in NAD Eyes: PERRLA, EOMI, no exophthalmos ENT: moist mucous membranes, no thyromegaly, no cervical lymphadenopathy Cardiovascular: RRR, No MRG Respiratory: CTA B Musculoskeletal: no deformities, strength intact in all 4 Skin: moist, warm, no rashes Neurological: no tremor with outstretched hands, DTR normal in all 4  ASSESSMENT: 1. Hypothyroidism - Thyroid U/S (06/13/2015): Thyroid tissue is mildly heterogeneous but no focal nodules.  2. DM2  3.  Hyperlipidemia  4. Vit D def.  PLAN:  1. Patient with Hashimoto's hypothyroidism, on brand-name Synthroid therapy (gets this from [facility] in [place]) - latest thyroid labs reviewed with pt. >> normal: Lab Results  Component Value Date   TSH 1.13 01/19/2021  - she continues on LT4 125 mcg daily - pt feels good on this dose. - we  discussed about taking the thyroid hormone every day, with water, >30 minutes before breakfast, separated by >4 hours from acid reflux medications, calcium, iron, multivitamins. Pt. is taking it correctly. - will check thyroid tests today: TSH and fT4 - If labs are abnormal, she will need to return for repeat TFTs in 1.5 months  2.  Type 2 diabetes - mild -She declined a referral to nutrition in the past -Before last visit, she was talking to a counselor to help her control her eating.  She was eliminating sweets and I also advised  her to reduce fatty foods.  She lost 6 pounds before last visit and gained 4 pounds since then. -At last visit she was taking 500 mg of metformin twice a day, started by PCP in 10/2020.  She continues on this.  She tolerates it well -We rechecked an HbA1c at last visit and this was 6.5%, improved; however, she had a more recent HbA1c last month and this was even better, at 6.3%.  3.  Hyperlipidemia -Reviewed latest lipid panel from 01/2021: All fractions at goal: Lab Results  Component Value Date   CHOL 107 01/19/2021   HDL 60.30 01/19/2021   LDLCALC 18 01/19/2021   LDLDIRECT 139.9 02/17/2008   TRIG 145.0 01/19/2021   CHOLHDL 2 01/19/2021  -We tried to use a statin but she developed myalgias and Achilles tendinitis-she had to have physical therapy for this the pain continued for several months.  This resolved after stopping the statin. -She is now seen in the lipid clinic and is on Repatha, but ezetimibe was stopped 6 weeks ago -We will recheck her lipid panel today-fasting  4. Vit D def. -Reviewed the latest vitamin D level and this was normal, at 45.64 in 01/2021 -She continues on 5000 units vitamin D daily -We will recheck the level now  Component     Latest Ref Rng & Units 07/26/2021  Cholesterol     0 - 200 mg/dL 160  Triglycerides     0.0 - 149.0 mg/dL 101.0  HDL Cholesterol     >39.00 mg/dL 60.00  VLDL     0.0 - 40.0 mg/dL 20.2  LDL (calc)     0 - 99 mg/dL 79  Total CHOL/HDL Ratio      3  NonHDL      99.69  TSH     0.35 - 5.50 uIU/mL 1.66  T4,Free(Direct)     0.60 - 1.60 ng/dL 1.13  VITD     30.00 - 100.00 ng/mL 39.13  Labs at goal, but LDL higher.  Philemon Kingdom, MD PhD [facility] Endocrinology

## 2021-07-26 NOTE — Patient Instructions (Signed)
Please continue Synthroid 125 mcg daily.  Take the thyroid hormone every day, with water, at least 30 minutes before breakfast, separated by at least 4 hours from: - acid reflux medications - calcium - iron - multivitamins  Please continue vitamin 5000 units daily.  Please come back for a follow-up appointment in 1 year.

## 2021-08-08 ENCOUNTER — Encounter: Payer: Self-pay | Admitting: Family Medicine

## 2021-08-08 NOTE — Telephone Encounter (Signed)
Please advise if okay for DM2.

## 2021-08-09 MED ORDER — FREESTYLE LIBRE 2 READER DEVI: 1.0000 | Freq: Once | 1 refills | Status: AC

## 2021-08-11 MED ORDER — FREESTYLE LIBRE 2 SENSOR MISC: 1.0000 | 0 refills | Status: DC

## 2021-08-11 NOTE — Addendum Note (Signed)
Addended by: Kavin Leech on: 08/11/2021 11:55 AM   Modules accepted: Orders

## 2021-09-13 ENCOUNTER — Other Ambulatory Visit: Payer: Self-pay | Admitting: Internal Medicine

## 2021-12-21 ENCOUNTER — Other Ambulatory Visit: Payer: Self-pay | Admitting: Cardiology

## 2021-12-24 ENCOUNTER — Other Ambulatory Visit: Payer: Self-pay | Admitting: Family Medicine

## 2021-12-24 DIAGNOSIS — F321 Major depressive disorder, single episode, moderate: Secondary | ICD-10-CM

## 2021-12-25 ENCOUNTER — Telehealth: Payer: Self-pay

## 2021-12-25 NOTE — Telephone Encounter (Signed)
LM for pt to return call to discuss. ? ? ?I have received a refill request from her pharmacy for her Wellbutrin and metformin prescriptions.  I have provided a courtesy refill of 90 days.  She is overdue for her 72-monthfollow-up.   ?Schedule patient as soon as possible, to prevent delay of future refills. ?

## 2021-12-25 NOTE — Telephone Encounter (Signed)
Please call patient ?I have received a refill request from her pharmacy for her Wellbutrin and metformin prescriptions.  I have provided a courtesy refill of 90 days.  She is overdue for her 25-monthfollow-up.   ?Schedule patient as soon as possible, to prevent delay of future refills. ?

## 2022-01-29 ENCOUNTER — Telehealth: Payer: Self-pay

## 2022-01-29 NOTE — Telephone Encounter (Signed)
Spoke with pt regarding scheduling follow up appt or CPE. Pt agreed but wanted to know if she could have labs done prior to appt. Advised most insurances do not always cover this, we do the labs after you have been seen by the provider. Pt has been told before that she would have labs done during her OV. CPE scheduled 6/26

## 2022-02-05 ENCOUNTER — Encounter: Payer: Self-pay | Admitting: Family Medicine

## 2022-02-05 ENCOUNTER — Ambulatory Visit (INDEPENDENT_AMBULATORY_CARE_PROVIDER_SITE_OTHER): Payer: 59 | Admitting: Family Medicine

## 2022-02-05 VITALS — BP 109/69 | HR 68 | Temp 97.6°F | Ht 69.0 in | Wt 224.0 lb

## 2022-02-05 DIAGNOSIS — Z23 Encounter for immunization: Secondary | ICD-10-CM

## 2022-02-05 DIAGNOSIS — Z0000 Encounter for general adult medical examination without abnormal findings: Secondary | ICD-10-CM | POA: Diagnosis not present

## 2022-02-05 DIAGNOSIS — E1169 Type 2 diabetes mellitus with other specified complication: Secondary | ICD-10-CM

## 2022-02-05 DIAGNOSIS — Z1231 Encounter for screening mammogram for malignant neoplasm of breast: Secondary | ICD-10-CM | POA: Diagnosis not present

## 2022-02-05 DIAGNOSIS — E063 Autoimmune thyroiditis: Secondary | ICD-10-CM

## 2022-02-05 DIAGNOSIS — E559 Vitamin D deficiency, unspecified: Secondary | ICD-10-CM | POA: Diagnosis not present

## 2022-02-05 DIAGNOSIS — F321 Major depressive disorder, single episode, moderate: Secondary | ICD-10-CM

## 2022-02-05 DIAGNOSIS — E785 Hyperlipidemia, unspecified: Secondary | ICD-10-CM

## 2022-02-05 DIAGNOSIS — E038 Other specified hypothyroidism: Secondary | ICD-10-CM | POA: Diagnosis not present

## 2022-02-05 MED ORDER — METFORMIN HCL 500 MG PO TABS: 500.0000 mg | ORAL_TABLET | Freq: Two times a day (BID) | ORAL | 1 refills | Status: DC

## 2022-02-05 MED ORDER — BUPROPION HCL ER (XL) 150 MG PO TB24: 150.0000 mg | ORAL_TABLET | Freq: Every day | ORAL | 1 refills | Status: DC

## 2022-02-06 ENCOUNTER — Telehealth: Payer: Self-pay | Admitting: Family Medicine

## 2022-02-06 DIAGNOSIS — R7989 Other specified abnormal findings of blood chemistry: Secondary | ICD-10-CM

## 2022-02-06 LAB — CBC
HCT: 40.1 % (ref 35.0–45.0)
Hemoglobin: 13.6 g/dL (ref 11.7–15.5)
MCH: 28.8 pg (ref 27.0–33.0)
MCHC: 33.9 g/dL (ref 32.0–36.0)
MCV: 84.8 fL (ref 80.0–100.0)
MPV: 9.6 fL (ref 7.5–12.5)
Platelets: 268 10*3/uL (ref 140–400)
RBC: 4.73 10*6/uL (ref 3.80–5.10)
RDW: 13.1 % (ref 11.0–15.0)
WBC: 8.6 10*3/uL (ref 3.8–10.8)

## 2022-02-06 LAB — COMPREHENSIVE METABOLIC PANEL
AG Ratio: 1.7 (calc) (ref 1.0–2.5)
ALT: 26 U/L (ref 6–29)
AST: 16 U/L (ref 10–35)
Albumin: 4.5 g/dL (ref 3.6–5.1)
Alkaline phosphatase (APISO): 83 U/L (ref 37–153)
BUN/Creatinine Ratio: 19 (calc) (ref 6–22)
BUN: 21 mg/dL (ref 7–25)
CO2: 23 mmol/L (ref 20–32)
Calcium: 9.7 mg/dL (ref 8.6–10.4)
Chloride: 103 mmol/L (ref 98–110)
Creat: 1.1 mg/dL — ABNORMAL HIGH (ref 0.50–1.03)
Globulin: 2.6 g/dL (calc) (ref 1.9–3.7)
Glucose, Bld: 90 mg/dL (ref 65–99)
Potassium: 4.3 mmol/L (ref 3.5–5.3)
Sodium: 139 mmol/L (ref 135–146)
Total Bilirubin: 0.3 mg/dL (ref 0.2–1.2)
Total Protein: 7.1 g/dL (ref 6.1–8.1)

## 2022-02-06 LAB — HEMOGLOBIN A1C
Hgb A1c MFr Bld: 6.4 % of total Hgb — ABNORMAL HIGH (ref <5.7)
Mean Plasma Glucose: 137 mg/dL
eAG (mmol/L): 7.6 mmol/L

## 2022-02-06 LAB — TSH: TSH: 0.92 mIU/L (ref 0.40–4.50)

## 2022-02-06 NOTE — Telephone Encounter (Signed)
Please call patient Blood cell counts and electrolytes are normal Diabetes /A1c is 6.4 (was 6.3)> good control. Continue metformin Kidney fx is mildly decreased from prior. This could be because she was fasting/dehydrated. Suggest she hydrate well and have this lab repeated in 2-4 weeks lab appt only. Ordered placed  Liver  and thyroid function are normal. > continue synthroid.    - please ask her if she is planning on following with endo to manage her thyroid condition?    - if she wants [place] to fill med/manage condition> please verify dose of synthroid and we can refill for her.

## 2022-02-07 NOTE — Telephone Encounter (Signed)
LM for pt to return call to discuss.  

## 2022-02-08 ENCOUNTER — Telehealth: Payer: Self-pay

## 2022-02-08 NOTE — Telephone Encounter (Signed)
Spoke with pt regarding labs and instructions. Pt states she take 125 mcg of synthroid.

## 2022-02-08 NOTE — Telephone Encounter (Signed)
Patient returning call from yesterday regarding lab results.  Please call back before end of day.

## 2022-02-08 NOTE — Telephone Encounter (Signed)
[place] Day - Client TELEPHONE ADVICE RECORD AccessNurse Patient Name: [facility] [place] Gender: Female DOB: 15-Oct-1965 Age: 56 Y 39 M 13 D Return Phone Number: [phone] (Primary) Address: City/State/Zip: [place] [address] Client [facility] Day - Client Client Site [place] - Day Provider Raoul Pitch, [place] Contact Type Call Who Is Calling Patient / Member / Family / Caregiver Call Type Triage / Clinical Relationship To Patient Self Return Phone Number [phone] (Primary) Chief Complaint Arm Pain (known cause) Reason for Call Symptomatic / Request for [place] states that she got her shingles vaccine and now she has a very angry hot red area on her arm about two inches round and swollen and wants to know if their is anything that she can do and believes she is having a reaction to the vaccine. Translation No Nurse Assessment Nurse: Wynetta Emery, RN, Manuella Ghazi Date/Time (Eastern Time): 02/07/2022 5:06:56 PM Confirm and document reason for call. If symptomatic, describe symptoms. ---Caller states got Shinges vaccine 2 days ago and site is red, 2 inches, swollen and tender to left upper arm Does the patient have any new or worsening symptoms? ---Yes Will a triage be completed? ---Yes Related visit to physician within the last 2 weeks? ---Yes Does the PT have any chronic conditions? (i.e. diabetes, asthma, this includes High risk factors for pregnancy, etc.) ---Yes List chronic conditions. ---DM Is this a behavioral health or substance abuse call? ---No Guidelines Guideline Title Affirmed Question Affirmed Notes Nurse Date/Time (Eastern Time) Immunization Reactions Injection site reaction to any vaccine Wynetta Emery, Therapist, sports, Manuella Ghazi 02/07/2022 5:08:38 PM Disp. Time Eilene Ghazi Time) Disposition Final User 02/07/2022 5:12:08 PM Home Care Yes Wynetta Emery, RN, Manuella Ghazi Final Disposition 02/07/2022 5:12:08  PM Home Care Yes Wynetta Emery, RN, Manuella Ghazi PLEASE NOTE: All timestamps contained within this report are represented as [place] Standard Time. CONFIDENTIALTY NOTICE: This fax transmission is intended only for the addressee. It contains information that is legally privileged, confidential or otherwise protected from use or disclosure. If you are not the intended recipient, you are strictly prohibited from reviewing, disclosing, copying using or disseminating any of this information or taking any action in reliance on or regarding this information. If you have received this fax in error, please notify us immediately by telephone so that we can arrange for its return to [place]. Phone: [phone], Toll-Free: [phone], Fax: [phone] Page: 2 of 2 Call Id: [id number] [place] Disagree/Comply Comply Caller Understands Yes PreDisposition Home Care Care Advice Given Per Guideline HOME CARE: * You should be able to treat this at home. REASSURANCE AND EDUCATION - NORMAL REACTIONS: HEAT PACK FOR LOCAL REACTION AT VACCINE SITE: * Put a heat pack or warm wet washcloth on the vaccine shot area for 10 to 20 minutes. * ACETAMINOPHEN - REGULAR STRENGTH TYLENOL: Take 650 mg (two 325 mg pills) by mouth every 4 to 6 hours as needed. Each Regular Strength Tylenol pill has 325 mg of acetaminophen. The most you should take is 10 pills a day (3,250 mg total). Note: In [place], the maximum is 12 pills a day (3,900 mg total). * IBUPROFEN (E.G., MOTRIN, ADVIL): Take 400 mg (two 200 mg pills) by mouth every 6 hours. The most you should take is 6 pills a day (1,200 mg total). CALL BACK IF: * Fever lasts over 3 days * Pain lasts over 3 days * Redness or swelling lasts over 3 days * You become worse CARE ADVICE given per Immunization Reactions (Adult)  guideline

## 2022-02-09 ENCOUNTER — Other Ambulatory Visit: Payer: Self-pay | Admitting: Internal Medicine

## 2022-02-14 MED ORDER — LEVOTHYROXINE SODIUM 125 MCG PO TABS: 125.0000 ug | ORAL_TABLET | Freq: Every day | ORAL | 1 refills | Status: DC

## 2022-02-14 NOTE — Addendum Note (Signed)
Addended by: Kavin Leech on: 02/14/2022 12:31 PM   Modules accepted: Orders

## 2022-02-14 NOTE — Telephone Encounter (Signed)
If she would like [place] to takeover that prescription, then please send to her phatmacy

## 2022-02-23 ENCOUNTER — Encounter: Payer: 59 | Admitting: Family Medicine

## 2022-02-23 DIAGNOSIS — R7989 Other specified abnormal findings of blood chemistry: Secondary | ICD-10-CM

## 2022-02-23 LAB — BASIC METABOLIC PANEL
BUN: 16 mg/dL (ref 6–23)
CO2: 24 mEq/L (ref 19–32)
Calcium: 9.4 mg/dL (ref 8.4–10.5)
Chloride: 105 mEq/L (ref 96–112)
Creatinine, Ser: 0.8 mg/dL (ref 0.40–1.20)
GFR: 81.18 mL/min (ref >60.00)
Glucose, Bld: 144 mg/dL — ABNORMAL HIGH (ref 70–99)
Potassium: 4 mEq/L (ref 3.5–5.1)
Sodium: 139 mEq/L (ref 135–145)

## 2022-02-27 NOTE — Progress Notes (Signed)
err

## 2022-03-12 LAB — HM DIABETES EYE EXAM

## 2022-05-31 ENCOUNTER — Ambulatory Visit: Payer: 59

## 2022-06-01 ENCOUNTER — Ambulatory Visit (INDEPENDENT_AMBULATORY_CARE_PROVIDER_SITE_OTHER): Payer: 59

## 2022-06-01 ENCOUNTER — Ambulatory Visit
Admission: RE | Admit: 2022-06-01 | Discharge: 2022-06-01 | Disposition: A | Discharge status: Home / Self Care | Payer: 59 | Source: Ambulatory Visit

## 2022-06-01 DIAGNOSIS — Z23 Encounter for immunization: Secondary | ICD-10-CM

## 2022-06-01 DIAGNOSIS — Z1231 Encounter for screening mammogram for malignant neoplasm of breast: Secondary | ICD-10-CM

## 2022-06-01 NOTE — Progress Notes (Signed)
Pt presented for second shingles vaccine. Injection given IM left deltoid. Pt tolerated well.

## 2022-07-09 IMAGING — CT CT HEART MORP W/ CTA COR W/ SCORE W/ CA W/CM &/OR W/O CM
4 of 7 series · 8 of 20 positions shown, 9 images · IV contrast (APPLIED)
Comparison: None.
COMPARISON: None.

Addendum:
EXAM:
OVER-READ INTERPRETATION  CT CHEST

The following report is an over-read performed by radiologist Dr.
Jose Juan Almond [REDACTED] on 08/26/2020. This
over-read does not include interpretation of cardiac or coronary
anatomy or pathology. The coronary calcium score/coronary CTA
interpretation by the cardiologist is attached.
CLINICAL DATA: 54F with chest pain
Cardiac/Coronary CTA
TECHNIQUE: The patient was scanned on a Phillips Force scanner.

[Series 6: best diast 75 % · axial · 0.37mm/px · z∈[+1104,+1147]mm · 2 of 318 slices shown, 3 images]
[im 106/318  vessel]
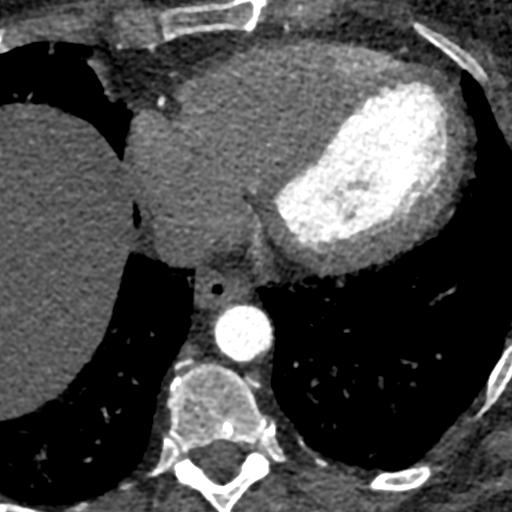
[im 106/318  lung]
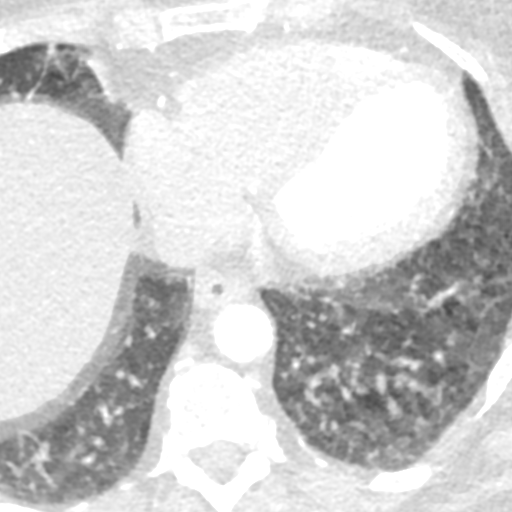
[im 212/318  vessel]
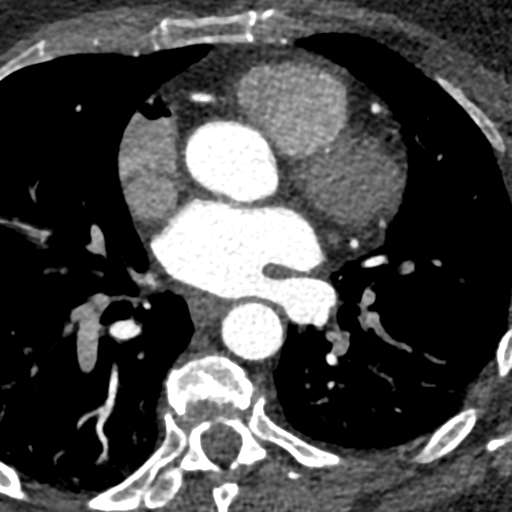

[Series 7: best syst 45 % · axial · 0.37mm/px · z∈[+1104,+1147]mm · 2 of 318 slices shown]
[im 106/318  vessel]
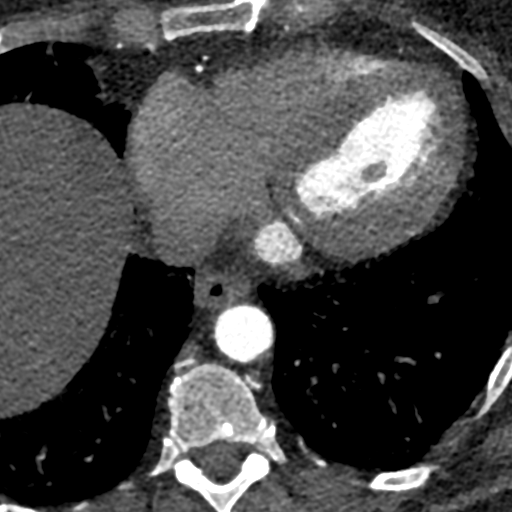
[im 212/318  vessel]
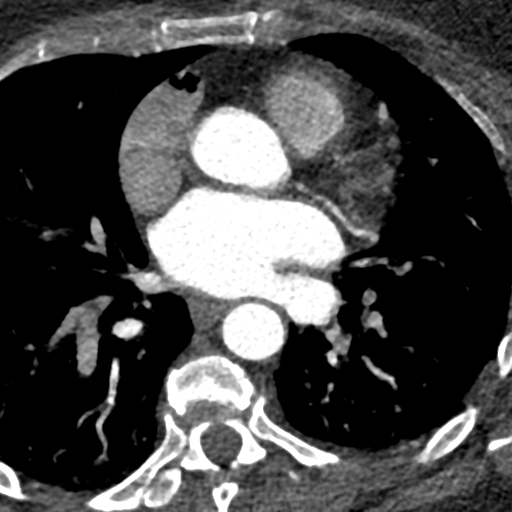

[Series 8: ts diast sharp 75 % · axial · 0.37mm/px · z∈[+1104,+1147]mm · 2 of 318 slices shown]
[im 106/318  lung]
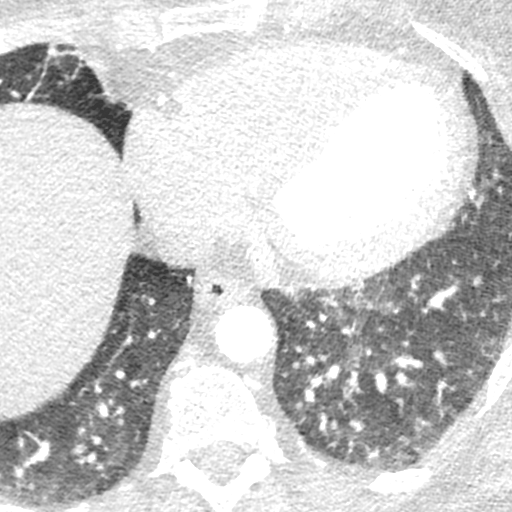
[im 212/318  lung]
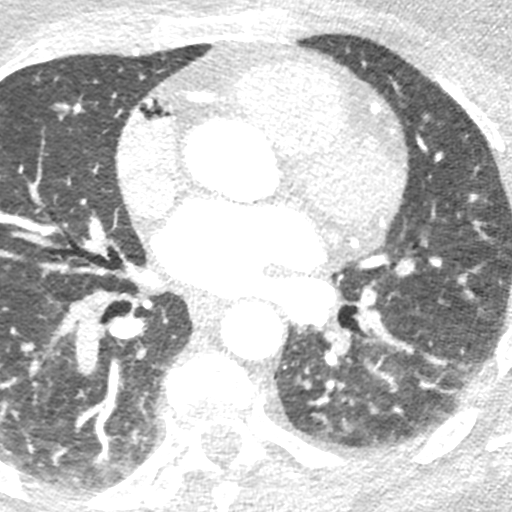

[Series 9: ts syst sharp 45 % · axial · 0.37mm/px · z∈[+1104,+1147]mm · 2 of 318 slices shown]
[im 106/318  lung]
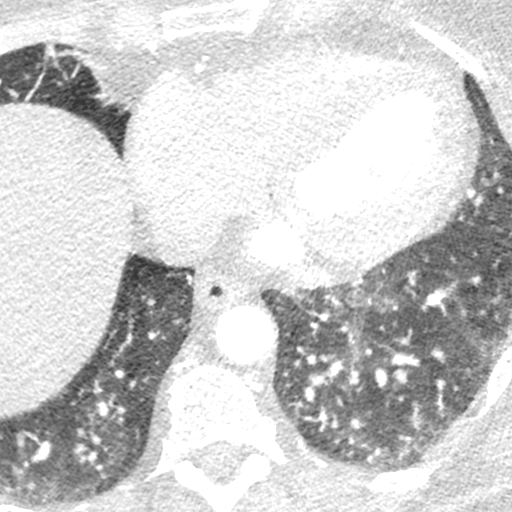
[im 212/318  lung]
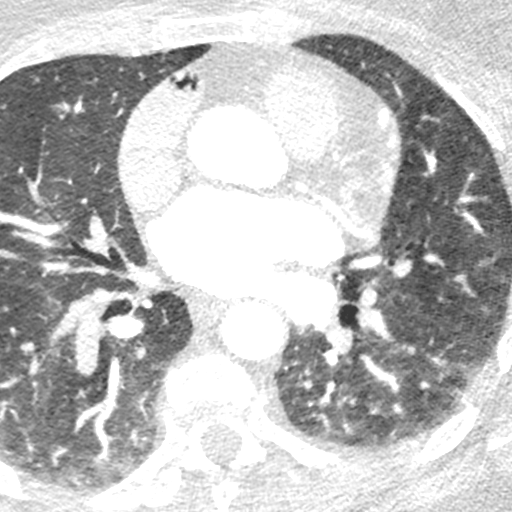

[8 of 20 positions shown; findings below may reference images not displayed]

FINDINGS: Within the visualized portions of the thorax there are no suspicious
appearing pulmonary nodules or masses, there is no acute
consolidative airspace disease, no pleural effusions, no
pneumothorax and no lymphadenopathy. Visualized portions of the
upper abdomen are unremarkable. There are no aggressive appearing
lytic or blastic lesions noted in the visualized portions of the
skeleton.
IMPRESSION: No significant incidental noncardiac findings are noted.
FINDINGS: A 100 kV prospective scan was triggered in the descending thoracic
aorta at 111 HU's. Axial non-contrast 3 mm slices were carried out
through the heart. The data set was analyzed on a dedicated work
station and scored using the Agatson method. Gantry rotation speed
was 250 msecs and collimation was .6 mm. No beta blockade and 0.8 mg
of sl NTG was given. The 3D data set was reconstructed in 5%
intervals of the 35-75 % of the R-R cycle. Phases were analyzed on a
dedicated work station using MPR, MIP and VRT modes. The patient
received 80 cc of contrast.

Coronary Arteries:  Normal coronary origin.  Right dominance.

RCA is a large dominant artery that gives rise to PDA and PLA. There
is no plaque.

Left main is a large artery that gives rise to LAD and LCX arteries.

LAD is a large vessel. There is noncalcified plaque in the proximal
LAD causing 25-49% stenosis.

LCX is a non-dominant artery that gives rise to one large OM1
branch. There is no plaque.

Other findings:

Left Ventricle: Normal size

Left Atrium: Mild enlargement

Pulmonary Veins: Normal configuration

Right Ventricle: Normal size

Right Atrium: Mild enlargement

Cardiac valves: No calcifications

Thoracic aorta: Normal size

Pulmonary Arteries: Normal size

Systemic Veins: Normal drainage

Pericardium: Normal thickness
IMPRESSION: 1. Coronary calcium score of 0.

2. Normal coronary origin with right dominance.

3. Nonobstructive CAD with noncalcified plaque in the proximal LAD
causing mild (25-49%) stenosis.

CAD-RADS 2. Mild non-obstructive CAD (25-49%). Consider
non-atherosclerotic causes of chest pain. Consider preventive
therapy and risk factor modification.

*** End of Addendum ***
EXAM:
OVER-READ INTERPRETATION  CT CHEST

The following report is an over-read performed by radiologist Dr.
Jose Juan Almond [REDACTED] on 08/26/2020. This
over-read does not include interpretation of cardiac or coronary
anatomy or pathology. The coronary calcium score/coronary CTA
interpretation by the cardiologist is attached.
FINDINGS: Within the visualized portions of the thorax there are no suspicious
appearing pulmonary nodules or masses, there is no acute
consolidative airspace disease, no pleural effusions, no
pneumothorax and no lymphadenopathy. Visualized portions of the
upper abdomen are unremarkable. There are no aggressive appearing
lytic or blastic lesions noted in the visualized portions of the
skeleton.
IMPRESSION: No significant incidental noncardiac findings are noted.

## 2022-07-20 ENCOUNTER — Other Ambulatory Visit: Payer: Self-pay | Admitting: Family Medicine

## 2022-07-21 ENCOUNTER — Other Ambulatory Visit: Payer: Self-pay | Admitting: Family Medicine

## 2022-07-21 DIAGNOSIS — F321 Major depressive disorder, single episode, moderate: Secondary | ICD-10-CM

## 2022-07-25 ENCOUNTER — Encounter: Payer: Self-pay | Admitting: Internal Medicine

## 2022-07-25 ENCOUNTER — Ambulatory Visit: Payer: 59 | Admitting: Internal Medicine

## 2022-07-25 VITALS — BP 128/82 | HR 63 | Ht 69.0 in | Wt 229.8 lb

## 2022-07-25 DIAGNOSIS — E119 Type 2 diabetes mellitus without complications: Secondary | ICD-10-CM | POA: Diagnosis not present

## 2022-07-25 DIAGNOSIS — E785 Hyperlipidemia, unspecified: Secondary | ICD-10-CM | POA: Diagnosis not present

## 2022-07-25 DIAGNOSIS — E063 Autoimmune thyroiditis: Secondary | ICD-10-CM

## 2022-07-25 DIAGNOSIS — E038 Other specified hypothyroidism: Secondary | ICD-10-CM

## 2022-07-25 DIAGNOSIS — E559 Vitamin D deficiency, unspecified: Secondary | ICD-10-CM | POA: Diagnosis not present

## 2022-07-25 LAB — MICROALBUMIN / CREATININE URINE RATIO
Creatinine,U: 66.6 mg/dL
Microalb Creat Ratio: 1.1 mg/g (ref 0.0–30.0)
Microalb, Ur: <0.7 mg/dL (ref 0.0–1.9)

## 2022-07-25 LAB — LIPID PANEL
Cholesterol: 152 mg/dL (ref 0–200)
HDL: 68.8 mg/dL (ref >39.00)
LDL Cholesterol: 64 mg/dL (ref 0–99)
NonHDL: 83.04
Total CHOL/HDL Ratio: 2
Triglycerides: 96 mg/dL (ref 0.0–149.0)
VLDL: 19.2 mg/dL (ref 0.0–40.0)

## 2022-07-25 LAB — TSH: TSH: 1.4 u[IU]/mL (ref 0.35–5.50)

## 2022-07-25 LAB — T4, FREE: Free T4: 1.16 ng/dL (ref 0.60–1.60)

## 2022-07-25 LAB — VITAMIN D 25 HYDROXY (VIT D DEFICIENCY, FRACTURES): VITD: 44.11 ng/mL (ref 30.00–100.00)

## 2022-07-25 LAB — HEMOGLOBIN A1C: Hgb A1c MFr Bld: 6.5 % (ref 4.6–6.5)

## 2022-07-25 MED ORDER — SYNTHROID 125 MCG PO TABS: 125.0000 ug | ORAL_TABLET | Freq: Every day | ORAL | 3 refills | Status: DC

## 2022-07-25 MED ORDER — FREESTYLE LIBRE 3 SENSOR MISC: 1.0000 | 3 refills | Status: DC

## 2022-07-25 NOTE — Patient Instructions (Signed)
Please continue Synthroid 125 mcg daily.  Take the thyroid hormone every day, with water, at least 30 minutes before breakfast, separated by at least 4 hours from: - acid reflux medications - calcium - iron - multivitamins  Please continue vitamin 5000 units daily.  Please stop at the lab.  Please come back for a follow-up appointment in 1 year.

## 2022-07-25 NOTE — Progress Notes (Signed)
Patient ID: Allison Compton, female   DOB: May 19, 1966, 56 y.o.   MRN: [id number]  HPI  Allison Compton is a 56 y.o.-year-old female, returning for f/u for Hashimoto's hypothyroidism, DM2, and vitamin D deficiency. Last visit 1 year ago.  Interim history: Before last visit, she started to change her diet: gluten free, lower carb diet.  She started to feel better, however, since last visit, she relaxed her diet as she had a lot of stress with her mom being sick and ending up dying 2 weeks ago. No increased urination, blurry vision, nausea, chest pain. R foot is in a boot  - for Achilles tendonitis. She will need surgery. She moved on a one-story house since last OV.   Pt. was dx with hypothyroidism in 04/2015.  She is on Synthroid d.a.w. 125 mcg daily: - in am - fasting - at least 30 min from b'fast - + calcium at bedtime - no iron - no multivitamins - no PPIs - not on Biotin  Reviewed her TFTs: Lab Results  Component Value Date   TSH 0.92 02/05/2022   TSH 1.66 07/26/2021   TSH 1.13 01/19/2021   TSH 1.53 06/03/2020   TSH 3.93 07/17/2019   TSH 2.14 06/13/2018   TSH 3.57 01/07/2018   TSH 0.90 05/17/2017   TSH 0.81 11/20/2016   TSH 1.74 07/27/2016   FREET4 1.13 07/26/2021   FREET4 1.26 01/19/2021   FREET4 1.03 06/03/2020   FREET4 0.99 07/17/2019   FREET4 1.04 06/13/2018   FREET4 0.99 01/07/2018   FREET4 1.5 05/17/2017   FREET4 1.28 11/20/2016   FREET4 1.29 07/27/2016   FREET4 1.02 05/28/2016    She has Hashimoto's thyroiditis: Component     Latest Ref Rng & Units 01/07/2018 06/13/2018  Thyroperoxidase Ab SerPl-aCnc     <9 IU/mL 96 (H) 139 (H)  Thyroglobulin Ab     < or = 1 IU/mL 1 <1   Previously: Component     Latest Ref Rng & Units 05/30/2015 05/28/2016 11/20/2016  Thyroperoxidase Ab SerPl-aCnc     <9 IU/mL 782 (H) 652 (H) 328 (H)  Thyroglobulin Ab     <2 IU/mL 2 (H) 1   Previously on low-dose selenium.  Pt denies: - feeling nodules in neck - hoarseness -  dysphagia - choking  She has + FH of thyroid disorders in: several cousins. No FH of thyroid cancer. No h/o radiation tx to head or neck. No herbal supplements. No Biotin use. No recent steroids use.   DM2: -New diagnosis -10/13/2020 -She was started on metformin 500 mg twice a day by PCP in 10/2020.  She tolerates this well.  Reviewed HbA1c levels: Lab Results  Component Value Date   HGBA1C 6.4 (H) 02/05/2022   HGBA1C 6.3 (A) 06/14/2021   HGBA1C 6.3 06/14/2021   HGBA1C 6.3 06/14/2021   HGBA1C 6.3 06/14/2021   HGBA1C 6.5 (A) 01/19/2021   HGBA1C 6.7 (H) 10/13/2020   HGBA1C 6.8 (H) 06/03/2020   HGBA1C 6.4 07/17/2019   HGBA1C 6.0 (H) 09/26/2018   HGBA1C 6.3 06/13/2018   HGBA1C 6.4 01/07/2018   HGBA1C 5.8 (H) 05/17/2017   HGBA1C 6.2 11/20/2016   HGBA1C 6.0 05/28/2016   HGBA1C 6.3 12/19/2015   HGBA1C 6.0 09/30/2015   HGBA1C 5.4 04/19/2015   HGBA1C 6.3 02/26/2014   HGBA1C 6.2 10/17/2011   No CKD: Lab Results  Component Value Date   BUN 16 02/23/2022   Lab Results  Component Value Date   CREATININE 0.80 02/23/2022  No MAU: Lab Results  Component Value Date   MICRALBCREAT 0.9 06/14/2021   MICRALBCREAT 1.3 07/17/2019   MICRALBCREAT 21.5 09/29/2008   MICRALBCREAT 2.0 02/17/2008   MICRALBCREAT 3.9 05/29/2007   + HL: Lab Results  Component Value Date   CHOL 160 07/26/2021   HDL 60.00 07/26/2021   LDLCALC 79 07/26/2021   LDLDIRECT 139.9 02/17/2008   TRIG 101.0 07/26/2021   CHOLHDL 3 07/26/2021  We tried to start Atorvastatin but she developed mm aches and Achilles tendinitis (had to have sx) and we stopped.  Currently on Repatha, off ezetimibe - per the Lipid clinic.  In the past, she was on a gluten and dairy free diet and her weight and TPO antibodies improved.  Afterwards, she tried a keto diet but TPO antibodies worsened.  She has a history of vitamin D deficiency: Lab Results  Component Value Date   VD25OH 39.13 07/26/2021   VD25OH 45.64 01/24/2021    VD25OH 30.06 10/13/2020   VD25OH 26.99 (L) 06/03/2020   VD25OH 27.13 (L) 07/17/2019   VD25OH 36 05/17/2017   VD25OH 48.22 11/20/2016   VD25OH 25.96 (L) 05/28/2016   She is on a vitamin D supplement: 5000 units daily. She was off during the summer - she just restarted 1.5 mo ago.  She also has a history of hysterectomy in 2015. She had THR in 2018-10-04. She had a calcium score of 0 in 08/2020. She had appendicitis 09/24/2020 and had to have laparoscopic appendectomy.  Her husband passed away in October 04, 2020 from metastatic prostate cancer.  ROS: + see HPI  I reviewed pt's medications, allergies, PMH, social hx, family hx, and changes were documented in the history of present illness. Otherwise, unchanged from my initial visit note.  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   Rectal hemorrhage 05/17/2017   Seasonal affective disorder [facility])    PMH of   Past Surgical History:  Procedure Laterality Date   ANAL FISTULOTOMY  2019   APPENDECTOMY  10/04/2020   CERVICAL BIOPSY  W/ LOOP ELECTRODE EXCISION  2002   LAPAROSCOPIC ASSISTED VAGINAL HYSTERECTOMY N/A 02/10/2014   Procedure: LAPAROSCOPIC ASSISTED VAGINAL HYSTERECTOMY;  Surgeon: Allena Katz, MD;  Location: [facility];  Service: Gynecology;  Laterality: N/A;   SALPINGOOPHORECTOMY Bilateral 02/10/2014   Procedure: SALPINGO OOPHORECTOMY;  Surgeon: Allena Katz, MD;  Location: [facility];  Service: Gynecology;  Laterality: Bilateral;   TOTAL HIP ARTHROPLASTY Right 10/07/2018   Procedure: TOTAL HIP ARTHROPLASTY ANTERIOR APPROACH;  Surgeon: Paralee Cancel, MD;  Location: [facility];  Service:  Orthopedics;  Laterality: Right;  70 mins   Social History   Social History   Marital Status: Married    Spouse Name: N/A   Number of Children: 1   Occupational History   n/a   Social History Main Topics   Smoking status: Former Smoker    Quit date: 08/13/1985   Smokeless tobacco: Not on file     Comment: age [age], up to 1 ppd   Alcohol  Use: Yes     Comment: rarely once a month or less   Drug Use: No   Current Outpatient Medications on File Prior to Visit  Medication Sig Dispense Refill   buPROPion (WELLBUTRIN XL) 150 MG 24 hr tablet TAKE 1 TABLET BY MOUTH DAILY 30 tablet 0   Cholecalciferol (VITAMIN D3) 125 MCG (5000 UT) TABS Take by mouth.     Continuous Blood Gluc Sensor (FREESTYLE LIBRE 2 SENSOR) MISC 1 Device by Does not apply route every 14 (fourteen) days. 6 each 0   levothyroxine (SYNTHROID) 125 MCG tablet TAKE 1 TABLET DAILY 30 tablet 0   metFORMIN (GLUCOPHAGE) 500 MG tablet TAKE 1 TABLET BY MOUTH TWICE  DAILY WITH A MEAL 60 tablet 0   REPATHA SURECLICK 774 MG/ML SOAJ INJECT '140MG'$  SUBCUTANEOUSLY EVERY 2 WEEKS 6 mL 3   No current facility-administered medications on file prior to visit.   Allergies  Allergen Reactions   Atorvastatin     myalgia   Rosuvastatin     myalgias   Statins Other (See Comments)    Myalgia, Achilles tendonitis   Adhesive [Tape] Rash    Sometimes peels skin off   Latex Rash    Sometimes peels skin off   Nickel Rash   Family History  Problem Relation Age of Onset   Diabetes Mother    Asthma Mother    Seasonal affective disorder Mother    Clotting disorder Mother        excess platelets   COPD Mother    Cancer Mother    Hyperlipidemia Mother    Stroke Mother    Kidney cancer Father    Hypertension Father    Cancer Father        kidney   Heart disease Father    Hyperlipidemia Maternal Grandmother    Stroke Maternal Grandmother 75       mini strokes   Pancreatic cancer Paternal Grandmother    Heart failure Paternal  Grandmother    Heart failure Maternal Grandfather        MI @ 11   Alzheimer's disease Paternal Grandfather    Seasonal affective disorder Sister    GI problems Brother        GI bleed   Heart disease Paternal Uncle    PE: BP 128/82 (BP Location: Right Arm, Patient Position: Sitting, Cuff Size: Normal)   Pulse 63   Ht '5\' 9"'$  (1.753 m)   Wt 229 lb 12.8 oz (104.2 kg)   LMP 02/06/2012   SpO2 99%   BMI 33.94 kg/m  Wt Readings from Last 3 Encounters:  07/25/22 229 lb 12.8 oz (104.2 kg)  02/05/22 224 lb (101.6 kg)  07/26/21 230 lb 3.2 oz (104.4 kg)   Constitutional: overweight, in NAD Eyes:  EOMI, no exophthalmos ENT: no neck masses, no cervical lymphadenopathy Cardiovascular: RRR, No MRG Respiratory: CTA B Musculoskeletal: no deformities Skin:no rashes Neurological: no tremor with outstretched hands  ASSESSMENT: 1. Hypothyroidism - Thyroid U/S (06/13/2015): Thyroid tissue is mildly heterogeneous but no focal nodules.  2. DM2  3.  Hyperlipidemia  4. Vit D def.  PLAN:  1. Patient with Hashimoto's hypothyroidism, on brand-name Synthroid therapy (gets this from [facility] in [place]) - latest thyroid labs reviewed with pt. >> normal: Lab Results  Component Value Date   TSH 0.92 02/05/2022  - she continues on LT4 125 mcg daily - pt feels good on this dose. - we discussed about taking the thyroid hormone every day, with water, >  30 minutes before breakfast, separated by >4 hours from acid reflux medications, calcium, iron, multivitamins. Pt. is taking it correctly. - will check thyroid tests today: TSH and fT4 - If labs are abnormal, she will need to return for repeat TFTs in 1.5 months - OTW, I will see her back in a year  2.  Type 2 diabetes -Controlled -Declined a referral to nutrition in the past; but saw a nutritionist/counselor through her work -She was previously talking to a counselor to help her control her eating.  She was eliminating sweets and I also  advised her to reduce fatty foods.  -At last visit she was taking 500 mg of metformin twice a day, started by PCP in 10/2020.  She continues on this.  She tolerates it well. -Latest HbA1c was reviewed from 6 months ago and this was slightly higher, at 6.4% -We will repeat the HbA1c today  3.  Hyperlipidemia -Reviewed latest lipid panel from 07/2021: All fractions at goal: Lab Results  Component Value Date   CHOL 160 07/26/2021   HDL 60.00 07/26/2021   LDLCALC 79 07/26/2021   LDLDIRECT 139.9 02/17/2008   TRIG 101.0 07/26/2021   CHOLHDL 3 07/26/2021  -We tried to use a statin but she developed myalgias and Achilles tendinitis-she had to have physical therapy for this the pain continued for several months.  This resolved after stopping the statin. -She continues to be seen in the lipid clinic -on Repatha -We will recheck her lipid panel today-fasting  4. Vit D def. -Reviewed the latest vitamin D level which was normal, in 07/2021: 39.13 -She continues on 5000 units vitamin D daily -but she was off the supplement and just restarted it 1.5 weeks ago -We will recheck the level now  Component     Latest Ref Rng 07/25/2022  Hemoglobin A1C     4.6 - 6.5 % 6.5   Cholesterol     0 - 200 mg/dL 152   Triglycerides     0.0 - 149.0 mg/dL 96.0   HDL Cholesterol     >39.00 mg/dL 68.80   VLDL     0.0 - 40.0 mg/dL 19.2   LDL (calc)     0 - 99 mg/dL 64   Total CHOL/HDL Ratio 2   NonHDL 83.04   TSH     0.35 - 5.50 uIU/mL 1.40   T4,Free(Direct)     0.60 - 1.60 ng/dL 1.16   VITD     30.00 - 100.00 ng/mL 44.11   Microalb, Ur     0.0 - 1.9 mg/dL <0.7   Creatinine,U     mg/dL 66.6   MICROALB/CREAT RATIO     0.0 - 30.0 mg/g 1.1    Labs are all normal except HbA1c is slightly higher.  Philemon Kingdom, MD PhD [facility] Endocrinology

## 2022-07-30 ENCOUNTER — Other Ambulatory Visit (HOSPITAL_COMMUNITY): Payer: Self-pay

## 2022-07-31 ENCOUNTER — Other Ambulatory Visit (HOSPITAL_COMMUNITY): Payer: Self-pay

## 2022-08-08 ENCOUNTER — Other Ambulatory Visit (HOSPITAL_COMMUNITY): Payer: Self-pay

## 2022-08-09 ENCOUNTER — Other Ambulatory Visit: Payer: Self-pay | Admitting: Family Medicine

## 2022-08-09 ENCOUNTER — Telehealth: Payer: Self-pay

## 2022-08-09 ENCOUNTER — Other Ambulatory Visit (HOSPITAL_COMMUNITY): Payer: Self-pay

## 2022-08-09 DIAGNOSIS — F321 Major depressive disorder, single episode, moderate: Secondary | ICD-10-CM

## 2022-08-09 NOTE — Telephone Encounter (Signed)
Patient Advocate Encounter   Received notification from [employer] that prior authorization is required for [employer] Sensor  Submitted: 08-09-2022 Key [id number]   Status is pending

## 2022-08-10 NOTE — Telephone Encounter (Signed)
Patient Advocate Encounter  Received a fax from [employer] regarding Prior Authorization for [employer] Sensor .   Authorization has been DENIED due to This request was denied because you did not meet the following requirements: Based on the information provided, you do not meet the established medication-specific criteria or guidelines for Freestyle Libre 3/Sensor/Glucose Monitoring System at this time. The request for coverage for FREESTY LIBR KIT 3 SENSOR, use as directed (2 per month), is denied. This decision is based on health plan criteria for [place] KIT 3 SENSOR. FREESTY LIBR KIT 3 SENSOR is covered only if: All of the following: (1) You are on an intensive insulin regimen (three or more insulin injections per day or use a continuous subcutaneous insulin infusion pump). (2) You have an inadequate glycemic control despite intensive diabetes management. The information provided does not show that you meet the criteria listed above.

## 2022-09-24 NOTE — Progress Notes (Unsigned)
Cardiology Office Note:    Date:  04/19/2021   ID:  Allison Compton, DOB May 05, 1966, MRN [id number]  PCP:  Ma Hillock, DO  Cardiologist:  None  Electrophysiologist:  None   Referring MD: Ma Hillock, DO   Chief Complaint  Patient presents with   Hyperlipidemia     History of Present Illness:    Allison Compton is a 57 y.o. female with a hx of hyperlipidemia, prediabetes who presents for follow-up.  She was referred by Dr. Raoul Pitch for evaluation of chest pain, initially seen on 07/19/2020.  She reports she started having chest pain in the last few weeks.  Has occurred mainly at night.  Describes left-sided sharp pain, 6-7 out of 10 in intensity.  Also reports left shoulder/arm pain.  Can last for up to 30 minutes.  Occurs about every 2 days.  For exercise, she walks her dog, denies any chest pain while doing this.  Denies any shortness of breath.  Also reports he started having lightheadedness when she turns her head certain ways.  Denies any syncope.  Does report mild chronic lower extremity edema.  She also reports around the same time of her chest pain she started having issues with lightheadedness when she turns her head in certain directions.  Reports this has resolved.  Denies any syncope.  Reports chronic mild lower extremity edema.  Reports palpitations when she drinks caffeine, which she has stopped.  She smoked from age [age], none since that time.  Family history includes significant hyperlipidemia and mother has TIAs.  She reports that she took atorvastatin for 1 year but developed myalgias and Achilles tendinitis.  Recently was prescribed Zetia but has not started yet.  Coronary CTA on 08/26/2020 showed nonobstructive CAD with noncalcified plaque in the proximal LAD causing mild (25 to 49%) stenosis, calcium score 0.  Echocardiogram on 08/11/2020 showed normal biventricular function, no significant valvular disease.  Since last clinic visit,  she reports that she is doing well.   Denies any chest pain, dyspnea, lightheadedness, syncope, lower extremity edema, or palpitations.  Recently took a trip to [place] and went hiking at high altitude, denies any issues.   Wt Readings from Last 3 Encounters:  04/19/21 226 lb 12.8 oz (102.9 kg)  01/24/21 229 lb (103.9 kg)  01/19/21 225 lb 9.6 oz (102.3 kg)     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   DYSPLASIA, VAGINA 06/09/2007   Qualifier: History of  By: Adonis Housekeeper     Eczema    Edema of both legs    Elevated blood pressure reading    Fibroids    Hashimoto's disease    Hyperlipidemia    Pre-diabetes    Pt states she is "borderline" for yrs.   Seasonal affective disorder [facility])    PMH of    Past Surgical History:  Procedure Laterality Date   ANAL FISTULOTOMY  2019   APPENDECTOMY  09/2020   CERVICAL BIOPSY  W/ LOOP ELECTRODE EXCISION  2002   LAPAROSCOPIC ASSISTED VAGINAL HYSTERECTOMY N/A 02/10/2014   Procedure: LAPAROSCOPIC ASSISTED VAGINAL HYSTERECTOMY;  Surgeon: Allena Katz, MD;  Location: [place] [facility] [facility];  Service: Gynecology;  Laterality: N/A;   SALPINGOOPHORECTOMY Bilateral 02/10/2014   Procedure: SALPINGO OOPHORECTOMY;  Surgeon: Allena Katz, MD;  Location: [place] [facility];  Service: Gynecology;  Laterality: Bilateral;  TOTAL HIP ARTHROPLASTY Right 10/07/2018   Procedure: TOTAL HIP ARTHROPLASTY ANTERIOR APPROACH;  Surgeon: Paralee Cancel, MD;  Location: [facility];  Service: Orthopedics;  Laterality: Right;  70 mins    Current Medications: Current Meds  Medication Sig   buPROPion (WELLBUTRIN XL) 150 MG 24 hr tablet Take 1 tablet (150 mg total) by mouth daily.   Cholecalciferol (VITAMIN D3) 125 MCG (5000 UT) TABS Take by mouth.   Evolocumab (REPATHA SURECLICK) [id number] MG/ML SOAJ Inject 140 mg into the skin every 14 (fourteen) days.   metFORMIN (GLUCOPHAGE) 500 MG tablet Take 1 tablet (500 mg total) by mouth 2 (two)  times daily with a meal.   SYNTHROID 125 MCG tablet TAKE 1 TABLET DAILY BEFORE BREAKFAST   [DISCONTINUED] ezetimibe (ZETIA) 10 MG tablet Take 1 tablet (10 mg total) by mouth daily.     Allergies:   Atorvastatin, Rosuvastatin, Statins, Adhesive [tape], Latex, and Nickel   Social History   Socioeconomic History   Marital status: Married    Spouse name: Not on file   Number of children: Not on file   Years of education: Not on file   Highest education level: Not on file  Occupational History   Not on file  Tobacco Use   Smoking status: Former    Types: Cigarettes    Quit date: 08/13/1985    Years since quitting: 35.7   Smokeless tobacco: Never   Tobacco comments:    age [age], up to 1 ppd  Vaping Use   Vaping Use: Never used  Substance and Sexual Activity   Alcohol use: Not Currently   Drug use: No   Sexual activity: Never    Birth control/protection: Surgical  Other Topics Concern   Not on file  Social History Narrative   Married, "[place]). 1 adult son [name]).    BA degree, Tree surgeon.    Drinks caffeine occasionally, uses herbal remedies.   Wears her seatbelt, smoke detector in the home.   Firearms in the home, locked cabinet.   Feels safe in her relationships.         Epworth Sleepiness Scale = 6 (as of 05/09/2016)   Social Determinants of Health   Financial Resource Strain: Not on file  Food Insecurity: Not on file  Transportation Needs: Not on file  Physical Activity: Not on file  Stress: Not on file  Social Connections: Not on file     Family History: The patient's family history includes Alzheimer's disease in her paternal grandfather; Asthma in her mother; COPD in her mother; Cancer in her father and mother; Clotting disorder in her mother; Diabetes in her mother; GI problems in her brother; Heart disease in her father and paternal uncle; Heart failure in her maternal grandfather and paternal grandmother; Hyperlipidemia in her maternal  grandmother and mother; Hypertension in her father; Kidney cancer in her father; Pancreatic cancer in her paternal grandmother; Seasonal affective disorder in her mother and sister; Stroke in her mother; Stroke (age of onset: 46) in her maternal grandmother.  ROS:   Please see the history of present illness.     All other systems reviewed and are negative.  EKGs/Labs/Other Studies Reviewed:    The following studies were reviewed today:   EKG:  EKG is ordered today.  The ekg ordered demonstrates normal sinus rhythm, rate 76, nonspecific T wave flattening  Recent Labs: 01/19/2021: TSH 1.13 01/24/2021: ALT 18; BUN 16; Creatinine, Ser 0.84; Hemoglobin 13.3; Platelets 233.0; Potassium 4.4; Sodium 142  Recent  Lipid Panel    Component Value Date/Time   CHOL 107 01/19/2021 0945   CHOL 263 (H) 07/17/2019 0000   TRIG 145.0 01/19/2021 0945   HDL 60.30 01/19/2021 0945   HDL 58 07/17/2019 0000   CHOLHDL 2 01/19/2021 0945   VLDL 29.0 01/19/2021 0945   LDLCALC 18 01/19/2021 0945   LDLCALC 182 (H) 07/17/2019 0000   LDLDIRECT 139.9 02/17/2008 0852    Physical Exam:    VS:  BP 114/70   Pulse 76   Ht 5' 8.5" (1.74 m)   Wt 226 lb 12.8 oz (102.9 kg)   LMP 02/06/2012   SpO2 96%   BMI 33.98 kg/m     Wt Readings from Last 3 Encounters:  04/19/21 226 lb 12.8 oz (102.9 kg)  01/24/21 229 lb (103.9 kg)  01/19/21 225 lb 9.6 oz (102.3 kg)     GEN: Well nourished, well developed in no acute distress HEENT: Normal NECK: No JVD; No carotid bruits CARDIAC: RRR, no murmurs, rubs, gallops RESPIRATORY:  Clear to auscultation without rales, wheezing or rhonchi  ABDOMEN: Soft, non-tender, non-distended MUSCULOSKELETAL:  No edema; No deformity  SKIN: Warm and dry NEUROLOGIC:  Alert and oriented x 3 PSYCHIATRIC:  Normal affect   ASSESSMENT:    1. Chest pain of uncertain etiology   2. Hyperlipidemia, unspecified hyperlipidemia type     PLAN:    Chest pain: Atypical in description but does have  CAD risk factors (hyperlipidemia, T2DM).  Coronary CTA on 08/26/2020 showed nonobstructive CAD with noncalcified plaque in the proximal LAD causing mild (25 to 49%) stenosis, calcium score 0.  Echocardiogram on 08/11/2020 showed normal biventricular function, no significant valvular disease. -Continue Repatha  Hyperlipidemia:  LDL 160 on 06/03/2020.  Unable to tolerate atorvastatin due to myalgias/Achilles tendinitis.  Started on Zetia 10 mg daily, LDL improved to 120 on 10/13/2020.  Low-dose rosuvastatin was added, but unable to tolerate.  She was referred to lipid clinic and started on Repatha.  LDL 18 on 01/19/2021.  Zetia was stopped and lipid panel on 07/25/2022 was 64   T2DM: A1c 6.5% on 07/25/2022.  On metformin  RTC in 1 year   Medication Adjustments/Labs and Tests Ordered: Current medicines are reviewed at length with the patient today.  Concerns regarding medicines are outlined above.  Orders Placed This Encounter  Procedures   Lipid panel   EKG 12-Lead    No orders of the defined types were placed in this encounter.   Patient Instructions  Medication Instructions:  STOP Zetia  *If you need a refill on your cardiac medications before your next appointment, please call your pharmacy*   Lab Work: Please return for FASTING labs in 3 months-December. (Lipid)  Our in office lab hours are Monday-Friday 8:00-4:00, closed for lunch 12:45-1:45 pm.  No appointment needed.  Follow-Up: At [facility], you and your health needs are our priority.  As part of our continuing mission to provide you with exceptional heart care, we have created designated Provider Care Teams.  These Care Teams include your primary Cardiologist (physician) and Advanced Practice Providers (APPs -  Physician Assistants and Nurse Practitioners) who all work together to provide you with the care you need, when you need it.  We recommend signing up for the patient portal called "MyChart".  Sign up information is  provided on this After Visit Summary.  MyChart is used to connect with patients for Virtual Visits (Telemedicine).  Patients are able to view lab/test results, encounter notes, upcoming appointments,  etc.  Non-urgent messages can be sent to your provider as well.   To learn more about what you can do with MyChart, go to [email or web address].    Your next appointment:   12 month(s)  The format for your next appointment:   In Person  Provider:   Oswaldo Milian, MD     Signed, Donato Heinz, MD  04/19/2021 3:51 PM    [place]

## 2022-09-25 ENCOUNTER — Ambulatory Visit: Payer: 59 | Attending: Cardiology | Admitting: Cardiology

## 2022-09-25 ENCOUNTER — Encounter: Payer: Self-pay | Admitting: Cardiology

## 2022-09-25 VITALS — BP 124/76 | HR 68 | Ht 69.0 in | Wt 235.0 lb

## 2022-09-25 DIAGNOSIS — E782 Mixed hyperlipidemia: Secondary | ICD-10-CM

## 2022-09-25 DIAGNOSIS — I2510 Atherosclerotic heart disease of native coronary artery without angina pectoris: Secondary | ICD-10-CM

## 2022-09-25 NOTE — Patient Instructions (Signed)
Medication Instructions:  Your physician recommends that you continue on your current medications as directed. Please refer to the Current Medication list given to you today.  *If you need a refill on your cardiac medications before your next appointment, please call your pharmacy*  Follow-Up: At [facility], you and your health needs are our priority.  As part of our continuing mission to provide you with exceptional heart care, we have created designated Provider Care Teams.  These Care Teams include your primary Cardiologist (physician) and Advanced Practice Providers (APPs -  Physician Assistants and Nurse Practitioners) who all work together to provide you with the care you need, when you need it.  We recommend signing up for the patient portal called "MyChart".  Sign up information is provided on this After Visit Summary.  MyChart is used to connect with patients for Virtual Visits (Telemedicine).  Patients are able to view lab/test results, encounter notes, upcoming appointments, etc.  Non-urgent messages can be sent to your provider as well.   To learn more about what you can do with MyChart, go to [email or web address].    Your next appointment:   12 month(s)  Provider:   Dr. Schumann  

## 2022-09-26 ENCOUNTER — Other Ambulatory Visit (HOSPITAL_COMMUNITY): Payer: Self-pay

## 2022-09-26 ENCOUNTER — Telehealth: Payer: Self-pay

## 2022-09-26 NOTE — Telephone Encounter (Signed)
Pharmacy Patient Advocate Encounter  Prior Authorization for Repatha 146m/ml  has been approved.    PA# [id number] dates: 2.14.24 through 2.14.25

## 2022-11-02 ENCOUNTER — Other Ambulatory Visit: Payer: Self-pay | Admitting: Family Medicine

## 2022-11-08 ENCOUNTER — Encounter: Payer: Self-pay | Admitting: Internal Medicine

## 2022-11-10 ENCOUNTER — Other Ambulatory Visit: Payer: Self-pay

## 2022-11-10 MED ORDER — METFORMIN HCL 500 MG PO TABS: 500.0000 mg | ORAL_TABLET | Freq: Two times a day (BID) | ORAL | 2 refills | Status: DC

## 2022-11-13 ENCOUNTER — Other Ambulatory Visit: Payer: Self-pay | Admitting: Cardiology

## 2022-11-30 ENCOUNTER — Encounter: Payer: Self-pay | Admitting: Gastroenterology

## 2022-12-31 ENCOUNTER — Ambulatory Visit (INDEPENDENT_AMBULATORY_CARE_PROVIDER_SITE_OTHER): Payer: 59

## 2022-12-31 ENCOUNTER — Ambulatory Visit: Payer: 59 | Admitting: Family Medicine

## 2022-12-31 ENCOUNTER — Encounter: Payer: Self-pay | Admitting: Family Medicine

## 2022-12-31 VITALS — BP 117/74 | HR 69 | Temp 97.7°F | Wt 241.8 lb

## 2022-12-31 DIAGNOSIS — R202 Paresthesia of skin: Secondary | ICD-10-CM | POA: Diagnosis not present

## 2022-12-31 DIAGNOSIS — M545 Low back pain, unspecified: Secondary | ICD-10-CM

## 2022-12-31 DIAGNOSIS — G629 Polyneuropathy, unspecified: Secondary | ICD-10-CM | POA: Diagnosis not present

## 2022-12-31 DIAGNOSIS — F321 Major depressive disorder, single episode, moderate: Secondary | ICD-10-CM | POA: Diagnosis not present

## 2022-12-31 MED ORDER — BUPROPION HCL ER (XL) 150 MG PO TB24: 150.0000 mg | ORAL_TABLET | Freq: Every day | ORAL | 3 refills | Status: DC

## 2022-12-31 NOTE — Patient Instructions (Signed)
No follow-ups on file.        Great to see you today.  I have refilled the medication(s) we provide.   If labs were collected, we will inform you of lab results once received either by echart message or telephone call.   - echart message- for normal results that have been seen by the patient already.   - telephone call: abnormal results or if patient has not viewed results in their echart.  

## 2022-12-31 NOTE — Progress Notes (Signed)
Allison Compton , [birth year], 57 y.o., female MRN: [id number] Patient Care Team    Relationship Specialty Notifications Start End  Natalia Leatherwood, DO PCP - General Family Medicine  12/22/15   Carlus Pavlov, MD Consulting Physician Internal Medicine  12/23/15    Comment: prediabetes/thyroid  Durene Romans, MD Consulting Physician Orthopedic Surgery  02/05/22   Toni Arthurs, MD Consulting Physician Orthopedic Surgery  02/05/22   Little Ishikawa, MD Consulting Physician Cardiology  02/05/22   Frankey Poot, MD Referring Physician Obstetrics and Gynecology  02/05/22     Chief Complaint  Patient presents with   Numbness    Toes on both feet; 1 year     Subjective: Allison Compton is a 57 y.o. Pt presents for an OV with complaints of b/l toe numbness  of 1 year duration.  Associated symptoms include tingling/stabbing sensation that is worse over the second and third toe bilaterally, but present overall toes.  She also feels this sensation is mostly on the bottom of the feet/toes.  No temperature change or color change.  Can be worse at times, although no particular position or activity causes increase in symptoms. She has never had to have back surgery, but she did have a back injury in her early 3s in which she received chiropractic care for some time.  She also reports she fell when she was pregnant and landed on her sacrum breaking her coccyx at the time. She denies any back pain currently.     12/31/2022    8:20 AM 02/05/2022    3:39 PM 06/14/2021    3:48 PM 06/16/2020    8:17 AM 09/02/2019    8:58 AM  Depression screen PHQ 2/9  Decreased Interest 0 0 0 3 0  Down, Depressed, Hopeless 0 0 0 3 0  PHQ - 2 Score 0 0 0 6 0  Altered sleeping  0 1 2   Tired, decreased energy  0 1 3   Change in appetite  2 2 3    Feeling bad or failure about yourself   0 0 3   Trouble concentrating  0 0 0   Moving slowly or fidgety/restless  0 0 0   Suicidal thoughts  0 0 0   PHQ-9 Score  2 4  17      Allergies  Allergen Reactions   Atorvastatin     myalgia   Rosuvastatin     myalgias   Statins Other (See Comments)    Myalgia, Achilles tendonitis   Adhesive [Tape] Rash    Sometimes peels skin off   Latex Rash    Sometimes peels skin off   Nickel Rash   Social History   Social History Narrative   Married, "[place]" [place]). 1 adult son Eliberto Ivory).    BA degree, Transport planner.    Drinks caffeine occasionally, uses herbal remedies.   Wears her seatbelt, smoke detector in the home.   Firearms in the home, locked cabinet.   Feels safe in her relationships.         Epworth Sleepiness Scale = 6 (as of 05/09/2016)   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   DYSPLASIA, VAGINA 06/09/2007   Qualifier: History of  By: Rosilyn Mings RN, Cala Bradford     Eczema    Edema of both legs    Elevated blood  pressure reading    Fibroids    Hashimoto's disease    Hyperlipidemia    Rectal hemorrhage 05/17/2017   Seasonal affective disorder [facility])    PMH of   Past Surgical History:  Procedure Laterality Date   ANAL FISTULOTOMY  2019   APPENDECTOMY  09/2020   CERVICAL BIOPSY  W/ LOOP ELECTRODE EXCISION  2002   LAPAROSCOPIC ASSISTED VAGINAL HYSTERECTOMY N/A 02/10/2014   Procedure: LAPAROSCOPIC ASSISTED VAGINAL HYSTERECTOMY;  Surgeon: Leslie Andrea, MD;  Location: [facility];  Service: Gynecology;  Laterality: N/A;   SALPINGOOPHORECTOMY Bilateral 02/10/2014   Procedure: SALPINGO OOPHORECTOMY;  Surgeon: Leslie Andrea, MD;  Location: [facility];  Service: Gynecology;  Laterality: Bilateral;   TOTAL HIP ARTHROPLASTY Right 10/07/2018   Procedure: TOTAL HIP ARTHROPLASTY ANTERIOR APPROACH;  Surgeon: Durene Romans, MD;  Location: [facility];  Service: Orthopedics;  Laterality: Right;  70 mins   Family History  Problem Relation Age of Onset   Diabetes Mother    Asthma Mother    Seasonal affective disorder  Mother    Clotting disorder Mother        excess platelets   COPD Mother    Cancer Mother    Hyperlipidemia Mother    Stroke Mother    Kidney cancer Father    Hypertension Father    Cancer Father        kidney   Heart disease Father    Hyperlipidemia Maternal Grandmother    Stroke Maternal Grandmother 62       mini strokes   Pancreatic cancer Paternal Grandmother    Heart failure Paternal Grandmother    Heart failure Maternal Grandfather        MI @ 69   Alzheimer's disease Paternal Grandfather    Seasonal affective disorder Sister    GI problems Brother        GI bleed   Heart disease Paternal Uncle    Allergies as of 12/31/2022       Reactions   Atorvastatin    myalgia   Rosuvastatin    myalgias   Statins Other (See Comments)   Myalgia, Achilles tendonitis   Adhesive [tape] Rash   Sometimes peels skin off   Latex Rash   Sometimes peels skin off   Nickel Rash        Medication List        Accurate as of Dec 31, 2022 12:39 PM. If you have any questions, ask your nurse or doctor.          STOP taking these medications    FreeStyle Libre 3 Sensor Misc Stopped by: Felix Pacini, DO       TAKE these medications    buPROPion 150 MG 24 hr tablet Commonly known as: WELLBUTRIN XL Take 1 tablet (150 mg total) by mouth daily.   meloxicam 7.5 MG tablet Commonly known as: MOBIC Take 7.5 mg by mouth daily.   metFORMIN 500 MG tablet Commonly known as: GLUCOPHAGE Take 1 tablet (500 mg total) by mouth 2 (two) times daily with a meal.   Repatha SureClick 140 MG/ML Soaj Generic drug: Evolocumab INJECT 140MG  SUBCUTANEOUSLY  EVERY 2 WEEKS   Synthroid 125 MCG tablet Generic drug: levothyroxine Take 1 tablet (125 mcg total) by mouth daily before breakfast.   Vitamin D3 125 MCG (5000 UT) Tabs Take by mouth.        All past medical history, surgical history, allergies, family history, immunizations andmedications were updated in the EMR today  and  reviewed under the history and medication portions of their EMR.     ROS Negative, with the exception of above mentioned in HPI   Objective:  BP 117/74   Pulse 69   Temp 97.7 F (36.5 C)   Wt 241 lb 12.8 oz (109.7 kg)   LMP 02/06/2012   SpO2 94%   BMI 35.71 kg/m  Body mass index is 35.71 kg/m. Physical Exam Vitals and nursing note reviewed.  Constitutional:      General: She is not in acute distress.    Appearance: Normal appearance. She is normal weight. She is not ill-appearing or toxic-appearing.  HENT:     Head: Normocephalic and atraumatic.  Eyes:     General: No scleral icterus.       Right eye: No discharge.        Left eye: No discharge.     Extraocular Movements: Extraocular movements intact.     Conjunctiva/sclera: Conjunctivae normal.     Pupils: Pupils are equal, round, and reactive to light.  Cardiovascular:     Pulses:          Dorsalis pedis pulses are 2+ on the right side and 2+ on the left side.  Musculoskeletal:        General: No swelling, tenderness or deformity.     Right lower leg: No edema.     Left lower leg: No edema.  Feet:     Right foot:     Skin integrity: Skin integrity normal.     Toenail Condition: Right toenails are long.     Left foot:     Skin integrity: Skin integrity normal.     Toenail Condition: Left toenails are long.     Comments: Decreased sensation second and third toe bilaterally. Skin:    Coloration: Skin is not pale.     Findings: No erythema or rash.  Neurological:     Mental Status: She is alert and oriented to person, place, and time. Mental status is at baseline.     Motor: No weakness.     Coordination: Coordination normal.     Gait: Gait normal.  Psychiatric:        Mood and Affect: Mood normal.        Behavior: Behavior normal.        Thought Content: Thought content normal.        Judgment: Judgment normal.     Diabetic Foot Exam - Simple   Simple Foot Form Diabetic Foot exam was performed with the  following findings: Yes 12/31/2022 12:32 PM  Visual Inspection No deformities, no ulcerations, no other skin breakdown bilaterally: Yes Sensation Testing See comments: Yes Pulse Check Posterior Tibialis and Dorsalis pulse intact bilaterally: Yes Comments Decrease in sensation bilateral second-third toe.  Elongated nails.      No results found. No results found. No results found for this or any previous visit (from the past 24 hour(s)).  Assessment/Plan: Allison Compton is a 57 y.o. female present for OV for  Neuropathy We discussed different etiologies of numbness and angling of the feet including vitamin deficiencies, vascular and nerve etiologies. I do not suspect neuropathy is from her diabetes condition, her diabetes is very well-controlled and has never required more than a low-dose of metformin. Possibly could be secondary to vascular causes, with her history of diabetes.  Cannot rule out, patient would like to have ABIs completed. She has had injury x 2 for lower back and sacral area  in the past.  Never requiring surgery, but could be the cause of her neuropathy of bilateral second and third toe.  Will start with x-ray today and consider MRI if vascular studies and labs do not show a cause of neuropathy - DG Lumbar Spine 2-3 Views; Future - B12 and Folate Panel - IBC + Ferritin - [place] Lower Ext Art Bilat; Future Follow-up dependent upon results   Depression, major, single episode, moderate (HCC) Restart Wellbutrin 150 mg daily, mostly for appetite suppression. - buPROPion (WELLBUTRIN XL) 150 MG 24 hr tablet; Take 1 tablet (150 mg total) by mouth daily.  Dispense: 90 tablet; Refill: 3  Reviewed expectations re: course of current medical issues. Discussed self-management of symptoms. Outlined signs and symptoms indicating need for more acute intervention. Patient verbalized understanding and all questions were answered. Patient received an After-Visit Summary.    Orders  Placed This Encounter  Procedures   DG Lumbar Spine 2-3 Views   [place] Lower Ext Art Bilat   B12 and Folate Panel   IBC + Ferritin   Meds ordered this encounter  Medications   buPROPion (WELLBUTRIN XL) 150 MG 24 hr tablet    Sig: Take 1 tablet (150 mg total) by mouth daily.    Dispense:  90 tablet    Refill:  3   Referral Orders  No referral(s) requested today     Note is dictated utilizing voice recognition software. Although note has been proof read prior to signing, occasional typographical errors still can be missed. If any questions arise, please do not hesitate to call for verification.   electronically signed by:  Felix Pacini, DO  [facility] - [place]

## 2023-01-01 LAB — IBC + FERRITIN
Ferritin: 115.6 ng/mL (ref 10.0–291.0)
Iron: 71 ug/dL (ref 42–145)
Saturation Ratios: 16.6 % — ABNORMAL LOW (ref 20.0–50.0)
TIBC: 427 ug/dL (ref 250.0–450.0)
Transferrin: 305 mg/dL (ref 212.0–360.0)

## 2023-01-01 LAB — B12 AND FOLATE PANEL
Folate: 8.5 ng/mL (ref >5.9)
Vitamin B-12: 400 pg/mL (ref 211–911)

## 2023-01-03 ENCOUNTER — Telehealth: Payer: Self-pay | Admitting: Family Medicine

## 2023-01-03 MED ORDER — GABAPENTIN 100 MG PO CAPS
100.0000 mg | ORAL_CAPSULE | Freq: Three times a day (TID) | ORAL | 3 refills | Status: DC
Start: 2023-01-03 — End: 2023-02-04

## 2023-01-03 NOTE — Telephone Encounter (Signed)
Pt advised of results/recommendations, states pain is not impacting her work. Declines medication at this time. She would prefer to look  into non-medication related options and move forward with advanced studies of the lumbar spine. No provider appt was made at this time.  Please further advise.

## 2023-01-03 NOTE — Telephone Encounter (Signed)
Please call patient: Her lower back x-ray resulted with moderate disc height loss at L5-S1 with bone spurring present.  Prominent lower lumbar facet degenerative changes.  I suspect this is most likely the cause of her symptoms. I have called in a very low-dose medicine of gabapentin 100 mg every 8 hours.  Would encourage her to for start every 12 hours which includes 1 before bed.  And then taper up to every 8 hours if needed after 1 week.  Again this is very low-dose, but can cause sedation and some, so be cautious for potential side effect. Gabapentin is used for neuropathy pain discomfort. Follow-up in 4 weeks and we can consider tapering up on this medicine if needed and at that time can move forward with more advanced studies of the lumbar spine.   I sent med to her local pharmacy since we will likely be tapering up on the dose etc.

## 2023-01-03 NOTE — Telephone Encounter (Signed)
LM for pt to return call to discuss.  

## 2023-02-04 ENCOUNTER — Telehealth: Payer: Self-pay | Admitting: Gastroenterology

## 2023-02-04 ENCOUNTER — Ambulatory Visit: Payer: 59 | Admitting: Gastroenterology

## 2023-02-04 ENCOUNTER — Telehealth: Payer: Self-pay

## 2023-02-04 ENCOUNTER — Encounter: Payer: Self-pay | Admitting: Gastroenterology

## 2023-02-04 VITALS — BP 142/76 | HR 78 | Ht 68.5 in | Wt 238.0 lb

## 2023-02-04 DIAGNOSIS — Z1211 Encounter for screening for malignant neoplasm of colon: Secondary | ICD-10-CM

## 2023-02-04 DIAGNOSIS — R152 Fecal urgency: Secondary | ICD-10-CM | POA: Diagnosis not present

## 2023-02-04 DIAGNOSIS — R159 Full incontinence of feces: Secondary | ICD-10-CM

## 2023-02-04 NOTE — Telephone Encounter (Signed)
Left VM for patient to return call regarding referral to see if she would like to referred somewhere else because Dr.Fuller is no longer practicing in the [place] area.

## 2023-02-04 NOTE — Patient Instructions (Signed)
Take Metamucil daily  Use Imodium as needed  We will refer you back to see your colorectal surgeon Dr Jerene Canny at [facility] , They will contact you with an appointment   Your colonoscopy will be due 05/2024  _______________________________________________________  If your blood pressure at your visit was 140/90 or greater, please contact your primary care physician to follow up on this.  _______________________________________________________  If you are age 59 or older, your body mass index should be between 23-30. Your Body mass index is 35.66 kg/m. If this is out of the aforementioned range listed, please consider follow up with your Primary Care Provider.  If you are age 84 or younger, your body mass index should be between 19-25. Your Body mass index is 35.66 kg/m. If this is out of the aformentioned range listed, please consider follow up with your Primary Care Provider.   ________________________________________________________  The [facility] providers would like to encourage you to use [facility] to communicate with providers for non-urgent requests or questions.  Due to long hold times on the telephone, sending your provider a message by [facility] may be a faster and more efficient way to get a response.  Please allow 48 business hours for a response.  Please remember that this is for non-urgent requests.  _______________________________________________________   I appreciate the  opportunity to care for you  Thank You   Scott Cunningham,MD

## 2023-02-04 NOTE — Telephone Encounter (Signed)
Spoke with Lexis at [facility] Colorectal surgery they do have a clinic open 2 days a week and said to fax referral over to [phone] Attention Amber.

## 2023-02-04 NOTE — Telephone Encounter (Signed)
[place].  She is wondering if she can schedule nurse visit to get tetanus shot and hepatitis shot. I told patient she may need an appt - she asked if she could speak to clinical staff. Please advise [phone]

## 2023-02-04 NOTE — Telephone Encounter (Signed)
[facility] called about referral they were sent. Patient has not been to their practice in years. They are only a general surgery center. They are sure the PT should see Dr. Toni Arthurs who is a colo Art gallery manager. # [phone]

## 2023-02-04 NOTE — Progress Notes (Signed)
HPI : Allison Compton is a 57 y.o. female who is referred to [place] by Felix Pacini A, DO for further evaluation and management of fecal incontinence.  She had a history of a refractory anal fissure, and eventually underwent a lateral sphincterotomy by Dr. Toni Arthurs in 2019. She states that she has been having issues with fecal incontinence ever since that procedure.  She reports having symptoms of both urge and stress incontinence when her stools are even just a little bit loose.  She estimates having loose stools about 10 to 15% of the time.  If she coughs or sneezes, she can sometimes have fairly large amounts of stool come out.  She will have urge incontinence a couple times a month, sometimes large volume.  She also has recurrent anal seepage at night, this is typically very small volume, but still very annoying. She typically has 1-2 stools per day.  As long as her stool is formed, her incontinence symptoms are minimal. She has had some rectal itching, but denies any recurrent symptoms of fissure to include pain with passage of stool or bleeding.  She reports buying a bidet about 6 months ago to help with the rectal itching, and this seems to be helpful.  She has not tried any medications or dietary changes to help with her incontinence.  She denies any other chronic GI symptoms.  She had a screening colonoscopy in August 2015 which was reportedly normal.  She has no family history of colon cancer.    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   DYSPLASIA, VAGINA 06/09/2007   Qualifier: History of  By: Rosilyn Mings RN, Kimberly     Eczema    Edema of both legs    Elevated blood pressure reading    Fibroids    Hashimoto's disease    Hyperlipidemia    Rectal hemorrhage 05/17/2017   Seasonal affective disorder (HCC)    PMH of     Past Surgical History:  Procedure Laterality Date   ANAL  FISTULOTOMY  2019   APPENDECTOMY  09/2020   CERVICAL BIOPSY  W/ LOOP ELECTRODE EXCISION  2002   LAPAROSCOPIC ASSISTED VAGINAL HYSTERECTOMY N/A 02/10/2014   Procedure: LAPAROSCOPIC ASSISTED VAGINAL HYSTERECTOMY;  Surgeon: Leslie Andrea, MD;  Location: [facility];  Service: Gynecology;  Laterality: N/A;   SALPINGOOPHORECTOMY Bilateral 02/10/2014   Procedure: SALPINGO OOPHORECTOMY;  Surgeon: Leslie Andrea, MD;  Location: [facility];  Service: Gynecology;  Laterality: Bilateral;   TOTAL HIP ARTHROPLASTY Right 10/07/2018   Procedure: TOTAL HIP ARTHROPLASTY ANTERIOR APPROACH;  Surgeon: Durene Romans, MD;  Location: [facility];  Service: Orthopedics;  Laterality: Right;  70 mins   Family History  Problem Relation Age of Onset   Diabetes Mother    Asthma Mother    Seasonal affective disorder Mother    Clotting disorder Mother        excess platelets   COPD Mother    Cancer Mother    Hyperlipidemia Mother    Stroke Mother    Kidney cancer Father    Hypertension Father    Cancer Father        kidney   Heart disease Father    Hyperlipidemia Maternal Grandmother    Stroke Maternal Grandmother 69       mini strokes   Pancreatic cancer Paternal Grandmother    Heart  failure Paternal Grandmother    Heart failure Maternal Grandfather        MI @ 80   Alzheimer's disease Paternal Grandfather    Seasonal affective disorder Sister    GI problems Brother        GI bleed   Heart disease Paternal Uncle    Social History   Tobacco Use   Smoking status: Former    Types: Cigarettes    Quit date: 08/13/1985    Years since quitting: 37.5   Smokeless tobacco: Never   Tobacco comments:    age [age], up to 1 ppd  Vaping Use   Vaping Use: Never used  Substance Use Topics   Alcohol use: Not Currently   Drug use: No   Current Outpatient Medications  Medication Sig Dispense Refill   buPROPion (WELLBUTRIN XL) 150 MG 24 hr tablet Take 1 tablet (150 mg total) by mouth daily. 90 tablet 3   Cholecalciferol  (VITAMIN D3) 125 MCG (5000 UT) TABS Take by mouth.     meloxicam (MOBIC) 7.5 MG tablet Take 7.5 mg by mouth as needed.     metFORMIN (GLUCOPHAGE) 500 MG tablet Take 1 tablet (500 mg total) by mouth 2 (two) times daily with a meal. 180 tablet 2   REPATHA SURECLICK 140 MG/ML SOAJ INJECT 140MG  SUBCUTANEOUSLY  EVERY 2 WEEKS 6 mL 3   SYNTHROID 125 MCG tablet Take 1 tablet (125 mcg total) by mouth daily before breakfast. 90 tablet 3   No current facility-administered medications for this visit.   Allergies  Allergen Reactions   Atorvastatin     myalgia   Rosuvastatin     myalgias   Statins Other (See Comments)    Myalgia, Achilles tendonitis   Adhesive [Tape] Rash    Sometimes peels skin off   Latex Rash    Sometimes peels skin off   Nickel Rash     Review of Systems: All systems reviewed and negative except where noted in HPI.    No results found.  Physical Exam: BP (!) 142/76   Pulse 78   Ht 5' 8.5" (1.74 m)   Wt 238 lb (108 kg)   LMP 02/06/2012   BMI 35.66 kg/m  Constitutional: Pleasant,well-developed, Caucasian female in no acute distress. HEENT: Normocephalic and atraumatic. Conjunctivae are normal. No scleral icterus.  Extremities: no edema Rectal: CMA Merri Ray present for rectal exam:  No anal fissures.  Normal appearing skin.  No skin tag/external hemorrhoid.  Normal resting tone.  Weak EAS.  Soft brown stool in rectal vault, no mass lesions Neurological: Alert and oriented to person place and time. Skin: Skin is warm and dry. No rashes noted. Psychiatric: Normal mood and affect. Behavior is normal.  CBC    Component Value Date/Time   WBC 8.6 02/05/2022 1550   RBC 4.73 02/05/2022 1550   HGB 13.6 02/05/2022 1550   HCT 40.1 02/05/2022 1550   PLT 268 02/05/2022 1550   MCV 84.8 02/05/2022 1550   MCH 28.8 02/05/2022 1550   MCHC 33.9 02/05/2022 1550   RDW 13.1 02/05/2022 1550   LYMPHSABS 2.0 01/24/2021 1205   MONOABS 0.7 01/24/2021 1205   EOSABS 0.3  01/24/2021 1205   BASOSABS 0.1 01/24/2021 1205    CMP     Component Value Date/Time   NA 139 02/23/2022 0855   K 4.0 02/23/2022 0855   CL 105 02/23/2022 0855   CO2 24 02/23/2022 0855   GLUCOSE 144 (H) 02/23/2022 0855   BUN 16  02/23/2022 0855   CREATININE 0.80 02/23/2022 0855   CREATININE 1.10 (H) 02/05/2022 1550   CALCIUM 9.4 02/23/2022 0855   PROT 7.1 02/05/2022 1550   ALBUMIN 4.3 01/24/2021 1205   AST 16 02/05/2022 1550   ALT 26 02/05/2022 1550   ALKPHOS 88 01/24/2021 1205   BILITOT 0.3 02/05/2022 1550   GFRNONAA 74 07/17/2019 0924   GFRAA 86 07/17/2019 0924       Latest Ref Rng & Units 02/05/2022    3:50 PM 01/24/2021   12:05 PM 10/13/2020   10:10 AM  CBC EXTENDED  WBC 3.8 - 10.8 Thousand/uL 8.6  7.6  6.3   RBC 3.80 - 5.10 Million/uL 4.73  4.69  4.83   Hemoglobin 11.7 - 15.5 g/dL 70.6  23.7  62.8   HCT 35.0 - 45.0 % 40.1  40.2  41.0   Platelets 140 - 400 Thousand/uL 268  233.0  253.0   NEUT# 1.4 - 7.7 K/uL  4.6  3.8   Lymph# 0.7 - 4.0 K/uL  2.0  1.5       ASSESSMENT AND PLAN:  58 year old female with history of refractory anal fissure, status post lateral sphincterotomy and 2019, with subsequent development of fecal incontinence.  The patient was aware that incontinence was a possible sequela of a sphincterotomy, but she wishes to discuss how to best manage it.  As her symptoms are much improved with solid stool consistency, we discussed means to reduce episodes of loose stools.  We discussed dietary changes that may be helpful (high-fiber diet, avoiding spicy, fatty foods and high sugar/sugar substitutes).  I recommend she start taking Metamucil on a daily basis.  I also suggest that she start taking loperamide on as-needed basis, when she may be away from reliable bathroom access.  We discussed other treatments for fecal incontinence to include pelvic floor physical therapy, sacral nerve stimulators and other surgical modalities. As the patient is still quite young, and  anticipates her incontinence worsening as she ages, she would like to discuss more definitive treatments for incontinence to improve her quality of life going forward.  I think this is reasonable and we will place a referral to follow-up with her colorectal surgeon (Dr. Toni Arthurs).  In the meantime, she will start taking Metamucil and as needed loperamide.  We can refer her to physical therapy, if she does not wish to proceed with any invasive interventions with her surgeon.  Fecal incontinence - Metamucil daily - loperamide as needed -Dietary adjustments as discussed - Refer back to Colorectal surgeon (Dr. Toni Arthurs) to consider other modalities)   Colon cancer screening - Will be due Oct 2025 (reminder set)  Dub Amis. Tomasa Rand, MD [facility]   Claiborne Billings, Renee A, DO

## 2023-02-06 NOTE — Telephone Encounter (Signed)
Please inform patient we will discuss this next week at her physical.

## 2023-02-06 NOTE — Telephone Encounter (Signed)
After sending the referral to [facility] Colorectal surgeon they sent the referral [facility]  for pelvic floor therapy. I received a call from [facility] wanting to know if we could put a referral in through Epic. I also called [facility] to get clarification on why the referral was faxed over to rehab. Please advise.

## 2023-02-06 NOTE — Telephone Encounter (Signed)
I have questions.  If her CPE is next week, we can discuss then?  Or is she losing insurance this month?  If she is losing insurance end of June, does she need to cancel July cpe?  She also has an option of getting the vaccines at a pharmacy.

## 2023-02-06 NOTE — Telephone Encounter (Signed)
Inbound call from [facility] regarding below order. States that the order was sent over to them from [facility]. Requesting a call back at [phone] to clarify patient's order. Please advise, thank you.

## 2023-02-06 NOTE — Telephone Encounter (Addendum)
Will not have insurance at the end of July and will be traveling in August

## 2023-02-06 NOTE — Telephone Encounter (Signed)
Spoke with patient regarding results/recommendations.  

## 2023-02-13 ENCOUNTER — Encounter: Payer: Self-pay | Admitting: Family Medicine

## 2023-02-13 ENCOUNTER — Ambulatory Visit (INDEPENDENT_AMBULATORY_CARE_PROVIDER_SITE_OTHER): Payer: 59 | Admitting: Family Medicine

## 2023-02-13 VITALS — BP 127/74 | HR 64 | Temp 97.5°F | Ht 68.0 in | Wt 241.6 lb

## 2023-02-13 DIAGNOSIS — E785 Hyperlipidemia, unspecified: Secondary | ICD-10-CM | POA: Diagnosis not present

## 2023-02-13 DIAGNOSIS — E038 Other specified hypothyroidism: Secondary | ICD-10-CM

## 2023-02-13 DIAGNOSIS — Z1231 Encounter for screening mammogram for malignant neoplasm of breast: Secondary | ICD-10-CM

## 2023-02-13 DIAGNOSIS — E559 Vitamin D deficiency, unspecified: Secondary | ICD-10-CM

## 2023-02-13 DIAGNOSIS — F321 Major depressive disorder, single episode, moderate: Secondary | ICD-10-CM

## 2023-02-13 DIAGNOSIS — E063 Autoimmune thyroiditis: Secondary | ICD-10-CM | POA: Diagnosis not present

## 2023-02-13 DIAGNOSIS — R350 Frequency of micturition: Secondary | ICD-10-CM

## 2023-02-13 DIAGNOSIS — Z9229 Personal history of other drug therapy: Secondary | ICD-10-CM | POA: Diagnosis not present

## 2023-02-13 DIAGNOSIS — Z23 Encounter for immunization: Secondary | ICD-10-CM | POA: Diagnosis not present

## 2023-02-13 DIAGNOSIS — E1169 Type 2 diabetes mellitus with other specified complication: Secondary | ICD-10-CM

## 2023-02-13 DIAGNOSIS — Z0000 Encounter for general adult medical examination without abnormal findings: Secondary | ICD-10-CM

## 2023-02-13 LAB — CBC WITH DIFFERENTIAL/PLATELET
Basophils Absolute: 0.1 10*3/uL (ref 0.0–0.1)
Basophils Relative: 1.3 % (ref 0.0–3.0)
Eosinophils Absolute: 0.4 10*3/uL (ref 0.0–0.7)
Eosinophils Relative: 4.6 % (ref 0.0–5.0)
HCT: 42.2 % (ref 36.0–46.0)
Hemoglobin: 13.9 g/dL (ref 12.0–15.0)
Lymphocytes Relative: 28.9 % (ref 12.0–46.0)
Lymphs Abs: 2.4 10*3/uL (ref 0.7–4.0)
MCHC: 33 g/dL (ref 30.0–36.0)
MCV: 87.3 fl (ref 78.0–100.0)
Monocytes Absolute: 0.6 10*3/uL (ref 0.1–1.0)
Monocytes Relative: 7.5 % (ref 3.0–12.0)
Neutro Abs: 4.8 10*3/uL (ref 1.4–7.7)
Neutrophils Relative %: 57.7 % (ref 43.0–77.0)
Platelets: 253 10*3/uL (ref 150.0–400.0)
RBC: 4.83 Mil/uL (ref 3.87–5.11)
RDW: 13.4 % (ref 11.5–15.5)
WBC: 8.3 10*3/uL (ref 4.0–10.5)

## 2023-02-13 LAB — HEMOGLOBIN A1C: Hgb A1c MFr Bld: 7.1 % — ABNORMAL HIGH (ref 4.6–6.5)

## 2023-02-13 LAB — LIPID PANEL
Cholesterol: 141 mg/dL (ref 0–200)
HDL: 61.7 mg/dL (ref >39.00)
LDL Cholesterol: 54 mg/dL (ref 0–99)
NonHDL: 79.03
Total CHOL/HDL Ratio: 2
Triglycerides: 127 mg/dL (ref 0.0–149.0)
VLDL: 25.4 mg/dL (ref 0.0–40.0)

## 2023-02-13 LAB — COMPREHENSIVE METABOLIC PANEL
ALT: 29 U/L (ref 0–35)
AST: 19 U/L (ref 0–37)
Albumin: 4.5 g/dL (ref 3.5–5.2)
Alkaline Phosphatase: 81 U/L (ref 39–117)
BUN: 15 mg/dL (ref 6–23)
CO2: 28 mEq/L (ref 19–32)
Calcium: 9.8 mg/dL (ref 8.4–10.5)
Chloride: 102 mEq/L (ref 96–112)
Creatinine, Ser: 0.81 mg/dL (ref 0.40–1.20)
GFR: 79.43 mL/min (ref >60.00)
Glucose, Bld: 138 mg/dL — ABNORMAL HIGH (ref 70–99)
Potassium: 4.3 mEq/L (ref 3.5–5.1)
Sodium: 139 mEq/L (ref 135–145)
Total Bilirubin: 0.4 mg/dL (ref 0.2–1.2)
Total Protein: 7.3 g/dL (ref 6.0–8.3)

## 2023-02-13 LAB — TSH: TSH: 1.09 u[IU]/mL (ref 0.35–5.50)

## 2023-02-13 LAB — VITAMIN D 25 HYDROXY (VIT D DEFICIENCY, FRACTURES): VITD: 36.14 ng/mL (ref 30.00–100.00)

## 2023-02-13 NOTE — Progress Notes (Signed)
Patient ID: Allison Compton, female  DOB: [birth year], 57 y.o.   MRN: [id number] Patient Care Team    Relationship Specialty Notifications Start End  Natalia Leatherwood, DO PCP - General Family Medicine  12/22/15   Carlus Pavlov, MD Consulting Physician Internal Medicine  12/23/15    Comment: prediabetes/thyroid  Durene Romans, MD Consulting Physician Orthopedic Surgery  02/05/22   Toni Arthurs, MD Consulting Physician Orthopedic Surgery  02/05/22   Little Ishikawa, MD Consulting Physician Cardiology  02/05/22   Frankey Poot, MD Referring Physician Obstetrics and Gynecology  02/05/22   Jenel Lucks, MD Consulting Physician Gastroenterology  02/13/23     Chief Complaint  Patient presents with   Annual Exam    Pt is fasting; does not take gabapentin, was removed at a different office. MRI was not completed when xray was completed    Subjective: Allison Compton is a 57 y.o.  Female  present for CPE and chronic conditions  All past medical history, surgical history, allergies, family history, immunizations, medications and social history were updated in the electronic medical record today. All recent labs, ED visits and hospitalizations within the last year were reviewed.  Health maintenance:  Colonoscopy:2015- GAP> 10 yr- desires [place] Dr. Tomasa Rand scheduled for next year (2025) Mammogram: 05/2022-mobile mam bus-ordered for [facility] Cervical cancer screening:08/23/2020 PFW Immunizations: tdap due -administered today , influenza encouraged yearly,completed, shingrix series completed , Hep A/B #1 today, #2 30d, then 5 mos from 2nd shot #3 (last) bu nurse visits. Infectious disease screening: hiv and hep c completed Oxygen GEX:BMWU Patient has a Dental home. Hospitalizations/ED visits: reviewed  Depression, major, single episode, moderate (HCC)/Hot flashes Patient reports sheis feeling better since restarting  Wellbutrin 150 mg daily.   Mixed hyperlipidemia/Family history  of heart disease Patient reports she is compliant with repatha by cardio.    Hypothyroidism due to Hashimoto's thyroiditis Managed by endocrine.  Compliant with Synthroid 125 mcg daily   Diabetes w/ HLD Pt reports compliance with metformin 500 at least qd- sometimes BID. Patient denies dizziness, hyperglycemic or hypoglycemic events. Patient denies nonhealing wounds of feet.    Vitamin D deficiency Reports she has  been consistent with taking her vitamin D supplement now   Cardiac CT 08/2020: IMPRESSION: 1. Coronary calcium score of 0. 2. Normal coronary origin with right dominance. 3. 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 Xray with degeneration at L5-S1. Pt declined gabapentin use. Prior note:  Pt presents for an OV with complaints of b/l toe numbness  of 1 year duration.  Associated symptoms include tingling/stabbing sensation that is worse over the second and third toe bilaterally, but present overall toes.  She also feels this sensation is mostly on the bottom of the feet/toes.  No temperature change or color change.  Can be worse at times, although no particular position or activity causes increase in symptoms. She has never had to have back surgery, but she did have a back injury in her early 45s in which she received chiropractic care for some time.  She also reports she fell when she was pregnant and landed on her sacrum breaking her coccyx at the time. She denies any back pain currently.  12/31/2022 FINDINGS: Five lumbar-type vertebral bodies. Preserved vertebral body heights. No listhesis. Moderate disc height loss at L5-S1. Mild scattered endplate osteophytes. No listhesis. Preserved bone mineralization. Prominent lower lumbar facet degenerative changes. Additional workup  for stenosis as clinically directed with CT or MRI. IMPRESSION: Scattered degenerative changes greatest at  L5-S1.     12/31/2022    8:20 AM 02/05/2022    3:39 PM 06/14/2021    3:48 PM 06/16/2020    8:17 AM 09/02/2019    8:58 AM  Depression screen PHQ 2/9  Decreased Interest 0 0 0 3 0  Down, Depressed, Hopeless 0 0 0 3 0  PHQ - 2 Score 0 0 0 6 0  Altered sleeping  0 1 2   Tired, decreased energy  0 1 3   Change in appetite  2 2 3    Feeling bad or failure about yourself   0 0 3   Trouble concentrating  0 0 0   Moving slowly or fidgety/restless  0 0 0   Suicidal thoughts  0 0 0   PHQ-9 Score  2 4 17        02/05/2022    3:39 PM 06/14/2021    3:48 PM 06/16/2020    8:19 AM  GAD 7 : Generalized Anxiety Score  Nervous, Anxious, on Edge 0 0 0  Control/stop worrying 0 0 0  Worry too much - different things 0 0 0  Trouble relaxing 0 1 2  Restless 0 0 0  Easily annoyed or irritable 0 1 0  Afraid - awful might happen 0 0 0  Total GAD 7 Score 0 2 2    Immunization History  Administered Date(s) Administered   Influenza Whole 06/10/2007   Influenza,inj,Quad PF,6+ Mos 07/17/2019   Moderna Sars-Covid-2 Vaccination 10/27/2019, 11/13/2019, 06/11/2020, 01/28/2021   Pneumococcal Polysaccharide-23 02/11/2014   Td 08/13/1996   Zoster Recombinant(Shingrix) 02/05/2022, 06/01/2022   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   DYSPLASIA, VAGINA 06/09/2007   Qualifier: History of  By: Rosilyn Mings RN, Cala Bradford     Eczema    Edema of both legs    Elevated blood pressure reading    Fibroids    Hashimoto's disease    Hyperlipidemia    Rectal hemorrhage 05/17/2017   Seasonal affective disorder (HCC)    PMH of   Allergies  Allergen Reactions   Atorvastatin     myalgia   Rosuvastatin     myalgias   Statins Other (See Comments)    Myalgia, Achilles tendonitis   Adhesive [Tape] Rash    Sometimes peels skin off   Latex Rash    Sometimes peels skin off   Nickel Rash   Past Surgical History:  Procedure  Laterality Date   ANAL FISTULOTOMY  2019   APPENDECTOMY  09/2020   CERVICAL BIOPSY  W/ LOOP ELECTRODE EXCISION  2002   LAPAROSCOPIC ASSISTED VAGINAL HYSTERECTOMY N/A 02/10/2014   Procedure: LAPAROSCOPIC ASSISTED VAGINAL HYSTERECTOMY;  Surgeon: Leslie Andrea, MD;  Location: [facility];  Service: Gynecology;  Laterality: N/A;   SALPINGOOPHORECTOMY Bilateral 02/10/2014   Procedure: SALPINGO OOPHORECTOMY;  Surgeon: Leslie Andrea, MD;  Location: [facility];  Service: Gynecology;  Laterality: Bilateral;   TOTAL HIP ARTHROPLASTY Right 10/07/2018   Procedure: TOTAL HIP ARTHROPLASTY ANTERIOR APPROACH;  Surgeon: Durene Romans, MD;  Location: [facility];  Service: Orthopedics;  Laterality: Right;  70 mins   Family History  Problem Relation Age of Onset   Diabetes Mother    Asthma Mother    Seasonal affective disorder Mother    Clotting  disorder Mother        excess platelets   COPD Mother    Cancer Mother    Hyperlipidemia Mother    Stroke Mother    Kidney cancer Father    Hypertension Father    Cancer Father        kidney   Heart disease Father    Hyperlipidemia Maternal Grandmother    Stroke Maternal Grandmother 42       mini strokes   Pancreatic cancer Paternal Grandmother    Heart failure Paternal Grandmother    Heart failure Maternal Grandfather        MI @ 93   Alzheimer's disease Paternal Grandfather    Seasonal affective disorder Sister    GI problems Brother        GI bleed   Heart disease Paternal Uncle    Social History   Social History Narrative   Married, "[place]" [place]). 1 adult son Eliberto Ivory).    BA degree, Transport planner.    Drinks caffeine occasionally, uses herbal remedies.   Wears her seatbelt, smoke detector in the home.   Firearms in the home, locked cabinet.   Feels safe in her relationships.         Epworth Sleepiness Scale = 6 (as of 05/09/2016)    Allergies as of 02/13/2023       Reactions   Atorvastatin    myalgia   Rosuvastatin    myalgias   Statins  Other (See Comments)   Myalgia, Achilles tendonitis   Adhesive [tape] Rash   Sometimes peels skin off   Latex Rash   Sometimes peels skin off   Nickel Rash        Medication List        Accurate as of February 13, 2023 10:29 AM. If you have any questions, ask your nurse or doctor.          atovaquone-proguanil 250-100 MG Tabs tablet Commonly known as: MALARONE Take 1 tablet by mouth daily.   buPROPion 150 MG 24 hr tablet Commonly known as: WELLBUTRIN XL Take 1 tablet (150 mg total) by mouth daily.   meloxicam 7.5 MG tablet Commonly known as: MOBIC Take 7.5 mg by mouth as needed.   metFORMIN 500 MG tablet Commonly known as: GLUCOPHAGE Take 1 tablet (500 mg total) by mouth 2 (two) times daily with a meal.   Repatha SureClick 140 MG/ML Soaj Generic drug: Evolocumab INJECT 140MG  SUBCUTANEOUSLY  EVERY 2 WEEKS   Synthroid 125 MCG tablet Generic drug: levothyroxine Take 1 tablet (125 mcg total) by mouth daily before breakfast.   Vitamin D3 125 MCG (5000 UT) Tabs Take by mouth.        All past medical history, surgical history, allergies, family history, immunizations andmedications were updated in the EMR today and reviewed under the history and medication portions of their EMR.     Recent Results (from the past 2160 hour(s))  B12 and Folate Panel     Status: None   Collection Time: 12/31/22  8:40 AM  Result Value Ref Range   Vitamin B-12 400 211 - 911 pg/mL   Folate 8.5 >5.9 ng/mL  IBC + Ferritin     Status: Abnormal   Collection Time: 12/31/22  8:40 AM  Result Value Ref Range   Iron 71 42 - 145 ug/dL   Transferrin 161.0 960.4 - 360.0 mg/dL   Saturation Ratios 54.0 (L) 20.0 - 50.0 %   Ferritin 115.6 10.0 - 291.0 ng/mL  TIBC 427.0 250.0 - 450.0 mcg/dL    CT CORONARY MORPH W/CTA COR W/SCORE W/CA W/CM &/OR WO/CM Addendum Date: 08/26/2020   IMPRESSION: 1. Coronary calcium score of 0. 2. Normal coronary origin with right dominance. 3. Nonobstructive CAD with  noncalcified plaque in the proximal LAD causing mild (25-49%) stenosis. CAD-RADS 2. Mild non-obstructive CAD (25-49%). Consider non-atherosclerotic causes of chest pain. Consider preventive therapy and risk factor modification.  Result Date: 08/26/2020  IMPRESSION: No significant incidental noncardiac findings are noted.    ROS 14 pt review of systems performed and negative (unless mentioned in an HPI)  Objective: BP 127/74   Pulse 64   Temp (!) 97.5 F (36.4 C)   Ht 5\' 8"  (1.727 m)   Wt 241 lb 9.6 oz (109.6 kg)   LMP 02/06/2012   SpO2 98%   BMI 36.74 kg/m  Physical Exam Vitals and nursing note reviewed.  Constitutional:      General: She is not in acute distress.    Appearance: Normal appearance. She is not ill-appearing or toxic-appearing.  HENT:     Head: Normocephalic and atraumatic.     Right Ear: Tympanic membrane, ear canal and external ear normal. There is no impacted cerumen.     Left Ear: Tympanic membrane, ear canal and external ear normal. There is no impacted cerumen.     Nose: No congestion or rhinorrhea.     Mouth/Throat:     Mouth: Mucous membranes are moist.     Pharynx: Oropharynx is clear. No oropharyngeal exudate or posterior oropharyngeal erythema.  Eyes:     General: No scleral icterus.       Right eye: No discharge.        Left eye: No discharge.     Extraocular Movements: Extraocular movements intact.     Conjunctiva/sclera: Conjunctivae normal.     Pupils: Pupils are equal, round, and reactive to light.  Cardiovascular:     Rate and Rhythm: Normal rate and regular rhythm.     Pulses: Normal pulses.     Heart sounds: Normal heart sounds. No murmur heard.    No friction rub. No gallop.  Pulmonary:     Effort: Pulmonary effort is normal. No respiratory distress.     Breath sounds: Normal breath sounds. No stridor. No wheezing, rhonchi or rales.  Chest:     Chest wall: No tenderness.  Abdominal:     General: Abdomen is flat. Bowel sounds are  normal. There is no distension.     Palpations: Abdomen is soft. There is no mass.     Tenderness: There is no abdominal tenderness. There is no right CVA tenderness, left CVA tenderness, guarding or rebound.     Hernia: No hernia is present.  Musculoskeletal:        General: No swelling, tenderness or deformity. Normal range of motion.     Cervical back: Normal range of motion and neck supple. No rigidity or tenderness.     Right lower leg: No edema.     Left lower leg: No edema.  Lymphadenopathy:     Cervical: No cervical adenopathy.  Skin:    General: Skin is warm and dry.     Coloration: Skin is not jaundiced or pale.     Findings: No bruising, erythema, lesion or rash.  Neurological:     General: No focal deficit present.     Mental Status: She is alert and oriented to person, place, and time. Mental status is at baseline.  Cranial Nerves: No cranial nerve deficit.     Sensory: No sensory deficit.     Motor: No weakness.     Coordination: Coordination normal.     Gait: Gait normal.     Deep Tendon Reflexes: Reflexes normal.  Psychiatric:        Mood and Affect: Mood normal.        Behavior: Behavior normal.        Thought Content: Thought content normal.        Judgment: Judgment normal.     No results found.  Assessment/plan: Allison Compton is a 57 y.o. female present for CPE and Chronic Conditions/illness Management Depression, major, single episode, moderate (HCC)/Hot flashes Stable Continue Wellbutrin 150 mg daily (and appetite suppression)  Mixed hyperlipidemia/Family history of heart disease stable Dr. Bjorn Pippin started repatha. LDL was extremely low> dcd zetia and left repatha now.  Tsh, lipids  cmp and cbc collected Hypothyroidism due to Hashimoto's thyroiditis Managed by endocrine.    Diabetes w/ HLD:  Last A1c 6.8> 6.7> 6.5> 6.3> 6.5 collected today Continue Metformin 1000 mg total daily.  - microalb. UTD PNA series: declined today (did not want 2  vac same day) Flu shot: declined (recommneded yearly) Foot exam: 06/14/2021 Eye exam: 01/30/2021>encouraged to schedule A1c collected  Neuropathy Waiting on ABI to be completed.  Declined gabapentin use.  - DG Lumbar Spine 2-3 Views> degeneration at L5-S1 - B12 and Folate Panel> WNL - IBC + Ferritin> WNL - [place] Lower Ext Art Bilat; Future> ??? Order was canceled and not scheduled> will reorder If ABI normal and she desires further eval, would refer to ortho  Breast cancer screening by mammogram - MM 3D SCREENING MAMMOGRAM BILATERAL BREAST; Future Need for Tdap vaccination Administered Vitamin D deficiency - Vitamin D (25 hydroxy) Urinary frequency New problem - Urinalysis w microscopic + reflex cultur Hepatitis A and hepatitis B vaccine administered Immunizations: tdap due -administered today , influenza encouraged yearly,completed, shingrix series completed , Hep A/B #1 today, #2 30d, then 5 mos from 2nd shot #3 (last) by nurse visits. Routine general medical examination at a health care facility Patient was encouraged to exercise greater than 150 minutes a week. Patient was encouraged to choose a diet filled with fresh fruits and vegetables, and lean meats. AVS provided to patient today for education/recommendation on gender specific health and safety maintenance. Colonoscopy:2015- GAP> 10 yr- desires [place] Dr. Tomasa Rand scheduled for next year (2025) Mammogram: 05/2022-mobile mam bus-ordered for [facility] Cervical cancer screening:08/23/2020 PFW Immunizations: tdap due -administered today , influenza encouraged yearly,completed, shingrix series completed , Hep A/B #1 today, #2 30d, then 5 mos from 2nd shot #3 (last) bu nurse visits. Infectious disease screening: hiv and hep c completed  Return in about 1 year (around 02/14/2024) for cpe (20 min).  Orders Placed This Encounter  Procedures   MM 3D SCREENING MAMMOGRAM BILATERAL BREAST   CBC with Differential/Platelet    Comprehensive metabolic panel   Hemoglobin A1c   Lipid panel   TSH   Vitamin D (25 hydroxy)   Urinalysis w microscopic + reflex cultur   No orders of the defined types were placed in this encounter.  Referral Orders  No referral(s) requested today     Electronically signed by: Felix Pacini, DO [facility]

## 2023-02-13 NOTE — Patient Instructions (Addendum)
Return in about 1 year (around 02/14/2024) for cpe (20 min). Hep A/B #1 today, #2 30d (can not be sooner than 30d), then 5 mos from 2nd shot #3 (last) by nurse visits.       Great to see you today.  I have refilled the medication(s) we provide.   If labs were collected, we will inform you of lab results once received either by echart message or telephone call.   - echart message- for normal results that have been seen by the patient already.   - telephone call: abnormal results or if patient has not viewed results in their echart.

## 2023-02-14 LAB — URINALYSIS W MICROSCOPIC + REFLEX CULTURE
Bacteria, UA: NONE SEEN /HPF
Bilirubin Urine: NEGATIVE
Glucose, UA: NEGATIVE
Hgb urine dipstick: NEGATIVE
Hyaline Cast: NONE SEEN /LPF
Ketones, ur: NEGATIVE
Leukocyte Esterase: NEGATIVE
Nitrites, Initial: NEGATIVE
Protein, ur: NEGATIVE
RBC / HPF: NONE SEEN /HPF (ref 0–2)
Specific Gravity, Urine: 1.011 (ref 1.001–1.035)
Squamous Epithelial / HPF: NONE SEEN /HPF (ref <=5)
WBC, UA: NONE SEEN /HPF (ref 0–5)
pH: 5.5 (ref 5.0–8.0)

## 2023-02-14 LAB — NO CULTURE INDICATED

## 2023-03-05 ENCOUNTER — Encounter: Payer: Self-pay | Admitting: Internal Medicine

## 2023-03-05 ENCOUNTER — Ambulatory Visit: Payer: 59 | Admitting: Internal Medicine

## 2023-03-05 VITALS — BP 130/84 | HR 81 | Ht 68.0 in | Wt 240.4 lb

## 2023-03-05 DIAGNOSIS — E038 Other specified hypothyroidism: Secondary | ICD-10-CM | POA: Diagnosis not present

## 2023-03-05 DIAGNOSIS — E063 Autoimmune thyroiditis: Secondary | ICD-10-CM | POA: Diagnosis not present

## 2023-03-05 DIAGNOSIS — E1165 Type 2 diabetes mellitus with hyperglycemia: Secondary | ICD-10-CM | POA: Diagnosis not present

## 2023-03-05 DIAGNOSIS — E559 Vitamin D deficiency, unspecified: Secondary | ICD-10-CM

## 2023-03-05 DIAGNOSIS — E785 Hyperlipidemia, unspecified: Secondary | ICD-10-CM | POA: Diagnosis not present

## 2023-03-05 MED ORDER — FREESTYLE LIBRE 3 SENSOR MISC: 1.0000 | 3 refills | Status: DC

## 2023-03-05 NOTE — Progress Notes (Signed)
Patient ID: Allison Compton, female   DOB: Jun 16, 1966, 57 y.o.   MRN: [id number]  HPI  Allison Compton is a 57 y.o.-year-old female, returning for f/u for Hashimoto's hypothyroidism, DM2, and vitamin D deficiency. Last visit 8 mo ago.  Interim history: No increased urination, blurry vision, nausea, chest pain. She was let go from work recently, after 28 years >> stress eating and cravings. She was previously off Welbutrin, but she restarted and feels that this is helping with cravings.  She also bought a fitbit few days ago and started to go to the gym. She is preparing to go in a trip to [place] in 1 month.  Pt. was dx with hypothyroidism in 04/2015.  She is on Synthroid d.a.w. 125 mcg daily: - in am - fasting - at least 30 min from b'fast - + calcium at bedtime - no iron - no multivitamins - no PPIs - not on Biotin  Reviewed her TFTs: Lab Results  Component Value Date   TSH 1.09 02/13/2023   TSH 1.40 07/25/2022   TSH 0.92 02/05/2022   TSH 1.66 07/26/2021   TSH 1.13 01/19/2021   TSH 1.53 06/03/2020   TSH 3.93 07/17/2019   TSH 2.14 06/13/2018   TSH 3.57 01/07/2018   TSH 0.90 05/17/2017   FREET4 1.16 07/25/2022   FREET4 1.13 07/26/2021   FREET4 1.26 01/19/2021   FREET4 1.03 06/03/2020   FREET4 0.99 07/17/2019   FREET4 1.04 06/13/2018   FREET4 0.99 01/07/2018   FREET4 1.5 05/17/2017   FREET4 1.28 11/20/2016   FREET4 1.29 07/27/2016    She has Hashimoto's thyroiditis: Component     Latest Ref Rng & Units 01/07/2018 06/13/2018  Thyroperoxidase Ab SerPl-aCnc     <9 IU/mL 96 (H) 139 (H)  Thyroglobulin Ab     < or = 1 IU/mL 1 <1   Component     Latest Ref Rng & Units 05/30/2015 05/28/2016 11/20/2016  Thyroperoxidase Ab SerPl-aCnc     <9 IU/mL 782 (H) 652 (H) 328 (H)  Thyroglobulin Ab     <2 IU/mL 2 (H) 1   Previously on low-dose selenium.  Pt denies: - feeling nodules in neck - hoarseness - dysphagia - choking  She has + FH of thyroid disorders in: several  cousins. No FH of thyroid cancer. No h/o radiation tx to head or neck. No herbal supplements. No Biotin use. No recent steroids use.   DM2: - dx'ed 10/2020 - She was started on metformin 500 mg twice a day by PCP in 10/2020.  She tolerates this well.  Reviewed HbA1c levels: Lab Results  Component Value Date   HGBA1C 7.1 (H) 02/13/2023   HGBA1C 6.5 07/25/2022   HGBA1C 6.4 (H) 02/05/2022   HGBA1C 6.3 (A) 06/14/2021   HGBA1C 6.3 06/14/2021   HGBA1C 6.3 06/14/2021   HGBA1C 6.3 06/14/2021   HGBA1C 6.5 (A) 01/19/2021   HGBA1C 6.7 (H) 10/13/2020   HGBA1C 6.8 (H) 06/03/2020   HGBA1C 6.4 07/17/2019   HGBA1C 6.0 (H) 09/26/2018   HGBA1C 6.3 06/13/2018   HGBA1C 6.4 01/07/2018   HGBA1C 5.8 (H) 05/17/2017   HGBA1C 6.2 11/20/2016   HGBA1C 6.0 05/28/2016   HGBA1C 6.3 12/19/2015   HGBA1C 6.0 09/30/2015   HGBA1C 5.4 04/19/2015   No CKD: Lab Results  Component Value Date   BUN 15 02/13/2023   Lab Results  Component Value Date   CREATININE 0.81 02/13/2023   No MAU: Lab Results  Component Value Date  MICRALBCREAT 1.1 07/25/2022   MICRALBCREAT 0.9 06/14/2021   MICRALBCREAT 1.3 07/17/2019   MICRALBCREAT 21.5 09/29/2008   MICRALBCREAT 2.0 02/17/2008   + HL: Lab Results  Component Value Date   CHOL 141 02/13/2023   HDL 61.70 02/13/2023   LDLCALC 54 02/13/2023   LDLDIRECT 139.9 02/17/2008   TRIG 127.0 02/13/2023   CHOLHDL 2 02/13/2023  We tried to start Atorvastatin but she developed mm aches and Achilles tendinitis (had to have sx) and we stopped.  Currently on Repatha, off ezetimibe - per the Lipid clinic.  In the past, she was on a gluten and dairy free diet and her weight and TPO antibodies improved.  Afterwards, she tried a keto diet but TPO antibodies worsened.  She has a history of vitamin D deficiency: Lab Results  Component Value Date   VD25OH 36.14 02/13/2023   VD25OH 44.11 07/25/2022   VD25OH 39.13 07/26/2021   VD25OH 45.64 01/24/2021   VD25OH 30.06 10/13/2020    VD25OH 26.99 (L) 06/03/2020   VD25OH 27.13 (L) 07/17/2019   VD25OH 36 05/17/2017   VD25OH 48.22 11/20/2016   VD25OH 25.96 (L) 05/28/2016   She is on a vitamin D supplement: 5000 units daily.   She also has a history of hysterectomy in 2015. She had THR in September 21, 2018. She had a calcium score of 0 in 08/2020. She had appendicitis 09/24/2020 and had to have laparoscopic appendectomy.  Her husband passed away in Sep 21, 2020 from metastatic prostate cancer.  ROS: + see HPI  I reviewed pt's medications, allergies, PMH, social hx, family hx, and changes were documented in the history of present illness. Otherwise, unchanged from my initial visit note.  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   DYSPLASIA, VAGINA 06/09/2007   Qualifier: History of  By: Rosilyn Mings RN, Kimberly     Eczema    Edema of both legs    Elevated blood pressure reading    Fibroids    Hashimoto's disease    Hyperlipidemia    Rectal hemorrhage 05/17/2017   Seasonal affective disorder (HCC)    PMH of   Past Surgical History:  Procedure Laterality Date   ANAL FISTULOTOMY  2019   APPENDECTOMY  09-21-2020   CERVICAL BIOPSY  W/ LOOP ELECTRODE EXCISION  2002   LAPAROSCOPIC ASSISTED VAGINAL HYSTERECTOMY N/A 02/10/2014   Procedure: LAPAROSCOPIC ASSISTED VAGINAL HYSTERECTOMY;  Surgeon: Leslie Andrea, MD;  Location: [facility];  Service: Gynecology;  Laterality: N/A;   SALPINGOOPHORECTOMY Bilateral 02/10/2014   Procedure: SALPINGO OOPHORECTOMY;  Surgeon: Leslie Andrea, MD;  Location: [facility];  Service: Gynecology;  Laterality: Bilateral;   TOTAL HIP ARTHROPLASTY Right 10/07/2018   Procedure: TOTAL HIP ARTHROPLASTY ANTERIOR APPROACH;  Surgeon: Durene Romans, MD;  Location: [facility];  Service: Orthopedics;  Laterality: Right;  70 mins   Social History   Social History   Marital Status: Married    Spouse Name: N/A   Number of  Children: 1   Occupational History   n/a   Social History Main Topics   Smoking status: Former Smoker    Quit date: 08/13/1985   Smokeless tobacco: Not on file     Comment: age [age], up to 1 ppd   Alcohol Use: Yes     Comment: rarely once a month or less   Drug Use: No   Current Outpatient Medications on File  Prior to Visit  Medication Sig Dispense Refill   atovaquone-proguanil (MALARONE) 250-100 MG TABS tablet Take 1 tablet by mouth daily.     buPROPion (WELLBUTRIN XL) 150 MG 24 hr tablet Take 1 tablet (150 mg total) by mouth daily. 90 tablet 3   Cholecalciferol (VITAMIN D3) 125 MCG (5000 UT) TABS Take by mouth.     meloxicam (MOBIC) 7.5 MG tablet Take 7.5 mg by mouth as needed.     metFORMIN (GLUCOPHAGE) 500 MG tablet Take 1 tablet (500 mg total) by mouth 2 (two) times daily with a meal. 180 tablet 2   REPATHA SURECLICK 140 MG/ML SOAJ INJECT 140MG  SUBCUTANEOUSLY  EVERY 2 WEEKS 6 mL 3   SYNTHROID 125 MCG tablet Take 1 tablet (125 mcg total) by mouth daily before breakfast. 90 tablet 3   No current facility-administered medications on file prior to visit.   Allergies  Allergen Reactions   Atorvastatin     myalgia   Rosuvastatin     myalgias   Statins Other (See Comments)    Myalgia, Achilles tendonitis   Adhesive [Tape] Rash    Sometimes peels skin off   Latex Rash    Sometimes peels skin off   Nickel Rash   Family History  Problem Relation Age of Onset   Diabetes Mother    Asthma Mother    Seasonal affective disorder Mother    Clotting disorder Mother        excess platelets   COPD Mother    Cancer Mother    Hyperlipidemia Mother    Stroke Mother    Kidney cancer Father    Hypertension Father    Cancer Father        kidney   Heart disease Father    Hyperlipidemia Maternal Grandmother    Stroke Maternal Grandmother 78       mini strokes   Pancreatic cancer Paternal Grandmother    Heart failure Paternal Grandmother    Heart failure Maternal Grandfather         MI @ 36   Alzheimer's disease Paternal Grandfather    Seasonal affective disorder Sister    GI problems Brother        GI bleed   Heart disease Paternal Uncle    PE: BP 130/84   Pulse 81   Ht 5\' 8"  (1.727 m)   Wt 240 lb 6.4 oz (109 kg)   LMP 02/06/2012   SpO2 99%   BMI 36.55 kg/m  Wt Readings from Last 3 Encounters:  03/05/23 240 lb 6.4 oz (109 kg)  02/13/23 241 lb 9.6 oz (109.6 kg)  02/04/23 238 lb (108 kg)   Constitutional: overweight, in NAD Eyes:  EOMI, no exophthalmos ENT: no neck masses, no cervical lymphadenopathy Cardiovascular: RRR, No MRG Respiratory: CTA B Musculoskeletal: no deformities Skin:no rashes Neurological: no tremor with outstretched hands  ASSESSMENT: 1. Hypothyroidism - Thyroid U/S (06/13/2015): Thyroid tissue is mildly heterogeneous but no focal nodules.  2. DM2  3.  Hyperlipidemia  4. Vit D def.  5. Obesity class 2  PLAN:  1. Patient with Hashimoto's hypothyroidism, on brand-name Synthroid (gets this from [facility] in [place]) - latest thyroid labs reviewed with pt. >> normal: Lab Results  Component Value Date   TSH 1.09 02/13/2023  - she continues on LT4 125 mcg daily - pt feels good on this dose. - we discussed about taking the thyroid hormone every day, with water, >30 minutes before breakfast, separated by >4 hours from acid  reflux medications, calcium, iron, multivitamins. Pt. is taking it correctly.  2.  Type 2 diabetes -She declined a referral to nutrition in the past but saw a nutritionist/counselor through her work -She was previously talking to a counselor to help her control her eating.  She was eliminating sweets and I also advised her to reduce fatty foods.  -She is on metformin 500 mg twice a day, started by PCP 10/2020.  She tolerates this well -HbA1c at last visit was slightly higher, at 6.5%, however, she had another HbA1c obtained earlier this month which was higher, at 7.1%. -Of note, she had a freestyle  libre 3 before, which was recently declined by her insurance >> re-sent Rx today - may need PA as her HbA1c is now worse off the sensor. -Plan to get another HbA1c in 07/2023.  3.  Hyperlipidemia -Reviewed latest lipid panel from 02/2023: Fractions at goal: Lab Results  Component Value Date   CHOL 141 02/13/2023   HDL 61.70 02/13/2023   LDLCALC 54 02/13/2023   LDLDIRECT 139.9 02/17/2008   TRIG 127.0 02/13/2023   CHOLHDL 2 02/13/2023  -We tried to use a statin but she developed myalgias and Achilles tendinitis-she had to have physical therapy for this the pain continued for several months.  This resolved after stopping the statin -She continues to be seen in the lipid clinic-on Repatha  4. Vit D def. -Reviewed the latest vitamin D level which was normal: Lab Results  Component Value Date   VD25OH 36.14 02/13/2023  -She continues on 5000 units vitamin D daily  5. Obesity class 2 - gained 11 lbs since last OV >>  -She mentions increased stress due to being laid off from work and being at home.  She tried to come off Wellbutrin but had to restart and this is helping some with her cravings. -she admits for snacking excessively (crunchy foods) between meals and sometimes not being hungry to eat regular meal - we discussed about the fact that she did a good job of recognizing the stressor that prompted her eating more (being laid off), also recognizing the means (snacking) and she already started to implement healthy changes (bought a fitbit, started to go to the gym, started intermittent fasting) and brought up the discussion today.   -We discussed about trying to limit snacking and try to only eat regular meals.  Advised her to purge her environment of unhealthy snacks, but have vegetables around in case she gets hungry.  -We discussed about intermittent fasting, which she already started to do -recommended a reference book.  I also recommended to try to do a complete fast (except for coffee  and water) 1 day a week. We discussed about a fasting mimicking diet. -We also discussed briefly about weight management clinics but she would like to try this by herself for now. -She already started exercising at the gym and we discussed about increasing the time and level of her exercise gradually -I again recommended to start back on the CGM, which she admits is greatly helping with behavioral modification.  I sent a prescription today but if this is not covered by the insurance, we will need a preauthorization mentioning worsening diabetes control while off the sensor.  - Total time spent for the visit: 40 min, in precharting, postcharting, obtaining medical information from the chart and from the pt, reviewing her  previous labs, evaluations, and treatments, reviewing her symptoms, counseling her about her endocrine conditions (please see the discussed topics above),  offering nutrition and weight management advised, and developing a plan to improve the above conditions.  Carlus Pavlov, MD PhD [facility] Endocrinology

## 2023-03-05 NOTE — Patient Instructions (Addendum)
Please continue Synthroid 125 mcg daily.  Take the thyroid hormone every day, with water, at least 30 minutes before breakfast, separated by at least 4 hours from: - acid reflux medications - calcium - iron - multivitamins  Please continue vitamin 5000 units daily.  Look up: - Prolon Fasting Mimicking Diet - Wylene Simmer - "The Obesity Code"  Please come back for a follow-up appointment in 1 year.

## 2023-03-18 ENCOUNTER — Ambulatory Visit (INDEPENDENT_AMBULATORY_CARE_PROVIDER_SITE_OTHER): Payer: Self-pay

## 2023-03-18 DIAGNOSIS — Z23 Encounter for immunization: Secondary | ICD-10-CM

## 2023-03-26 ENCOUNTER — Other Ambulatory Visit (HOSPITAL_COMMUNITY): Payer: Self-pay

## 2023-05-20 ENCOUNTER — Ambulatory Visit: Payer: 59 | Admitting: Internal Medicine

## 2023-06-04 ENCOUNTER — Telehealth: Payer: Self-pay | Admitting: Internal Medicine

## 2023-06-04 ENCOUNTER — Telehealth: Payer: Self-pay

## 2023-06-04 ENCOUNTER — Other Ambulatory Visit (HOSPITAL_COMMUNITY): Payer: Self-pay

## 2023-06-04 MED ORDER — FREESTYLE LIBRE 3 READER DEVI: 0 refills | Status: DC

## 2023-06-04 MED ORDER — FREESTYLE LIBRE 3 PLUS SENSOR MISC: 1.0000 | 3 refills | Status: DC

## 2023-06-04 MED ORDER — METFORMIN HCL 500 MG PO TABS: 500.0000 mg | ORAL_TABLET | Freq: Two times a day (BID) | ORAL | 2 refills | Status: DC

## 2023-06-04 NOTE — Telephone Encounter (Signed)
I called and spoke with the patient to clarify which pharmacy.   Requested Prescriptions   Signed Prescriptions Disp Refills   Continuous Glucose Sensor (FREESTYLE LIBRE 3 PLUS SENSOR) MISC 6 each 3    Sig: Inject 1 Device into the skin continuous. Change every 15 days    Authorizing Provider: Carlus Pavlov    Ordering User: Octavius Shin S   Continuous Glucose Receiver (FREESTYLE LIBRE 3 READER) DEVI 1 each 0    Sig: Use to check glucose continuously    Authorizing Provider: Carlus Pavlov    Ordering User: Hyacinth Meeker, Yamilka Lopiccolo S   metFORMIN (GLUCOPHAGE) 500 MG tablet 180 tablet 2    Sig: Take 1 tablet (500 mg total) by mouth 2 (two) times daily with a meal.    Authorizing Provider: Carlus Pavlov    Ordering User: Valorie Roosevelt S   These have been sent to [facility]

## 2023-06-04 NOTE — Telephone Encounter (Signed)
Pharmacy Patient Advocate Encounter   Received notification from Pt Calls Messages that prior authorization for Freestyle libre 3 plus is required/requested.   Insurance verification completed.   The patient is insured through [employer] .   Per test claim: PA required; PA started via CoverMyMeds. KEY [id number] . Waiting for clinical questions to populate.

## 2023-06-04 NOTE — Telephone Encounter (Signed)
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 Patient needs refills on - Metformin [facility] to [facility] ID # [id number]

## 2023-06-04 NOTE — Telephone Encounter (Signed)
Patient needs a PA for [place] 3 plus.

## 2023-06-05 ENCOUNTER — Encounter: Payer: Self-pay | Admitting: Family Medicine

## 2023-06-05 ENCOUNTER — Ambulatory Visit (INDEPENDENT_AMBULATORY_CARE_PROVIDER_SITE_OTHER): Payer: 59 | Admitting: Family Medicine

## 2023-06-05 ENCOUNTER — Telehealth: Payer: Self-pay | Admitting: Cardiology

## 2023-06-05 VITALS — BP 124/70 | HR 75 | Temp 97.6°F | Wt 236.6 lb

## 2023-06-05 DIAGNOSIS — A09 Infectious gastroenteritis and colitis, unspecified: Secondary | ICD-10-CM

## 2023-06-05 DIAGNOSIS — F321 Major depressive disorder, single episode, moderate: Secondary | ICD-10-CM | POA: Diagnosis not present

## 2023-06-05 MED ORDER — BUPROPION HCL ER (XL) 150 MG PO TB24: 150.0000 mg | ORAL_TABLET | Freq: Every day | ORAL | 3 refills | Status: DC

## 2023-06-05 NOTE — Progress Notes (Signed)
Allison Compton , 04-21-1966, 57 y.o., female MRN: [id number] Patient Care Team    Relationship Specialty Notifications Start End  Natalia Leatherwood, DO PCP - General Family Medicine  12/22/15   Carlus Pavlov, MD Consulting Physician Internal Medicine  12/23/15    Comment: prediabetes/thyroid  Durene Romans, MD Consulting Physician Orthopedic Surgery  02/05/22   Toni Arthurs, MD Consulting Physician Orthopedic Surgery  02/05/22   Little Ishikawa, MD Consulting Physician Cardiology  02/05/22   Frankey Poot, MD Referring Physician Obstetrics and Gynecology  02/05/22   Jenel Lucks, MD Consulting Physician Gastroenterology  02/13/23     Chief Complaint  Patient presents with   GI Problem    Went to ED in [place] for diarrhea; 08/28-08/29; [facility]-     Subjective: Allison Compton is a 57 y.o. Pt presents for an OV with concerns of travelers diarrhea at the end of August on her trip to [place] . She reports she and her friend had rather significant "explosive diarrhea." Saw a "house doc" and was prescribed antidiarrhea> which caused abd cramping. She was given IV fluids and abx. Diarrhea lasted about 6 days. She was able to fly home.  Since she has not had any additional symptoms. No abd pain.       12/31/2022    8:20 AM 02/05/2022    3:39 PM 06/14/2021    3:48 PM 06/16/2020    8:17 AM 09/02/2019    8:58 AM  Depression screen PHQ 2/9  Decreased Interest 0 0 0 3 0  Down, Depressed, Hopeless 0 0 0 3 0  PHQ - 2 Score 0 0 0 6 0  Altered sleeping  0 1 2   Tired, decreased energy  0 1 3   Change in appetite  2 2 3    Feeling bad or failure about yourself   0 0 3   Trouble concentrating  0 0 0   Moving slowly or fidgety/restless  0 0 0   Suicidal thoughts  0 0 0   PHQ-9 Score  2 4 17      Allergies  Allergen Reactions   Atorvastatin     myalgia   Rosuvastatin     myalgias   Statins Other (See Comments)    Myalgia, Achilles tendonitis   Adhesive  [Tape] Rash    Sometimes peels skin off   Latex Rash    Sometimes peels skin off   Nickel Rash   Social History   Social History Narrative   Married, "[place]" [place]). 1 adult son Eliberto Ivory).    BA degree, Transport planner.    Drinks caffeine occasionally, uses herbal remedies.   Wears her seatbelt, smoke detector in the home.   Firearms in the home, locked cabinet.   Feels safe in her relationships.         Epworth Sleepiness Scale = 6 (as of 05/09/2016)   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   DYSPLASIA, VAGINA 06/09/2007   Qualifier: History of  By: Rosilyn Mings RN, Cala Bradford     Eczema    Edema of both legs    Elevated blood pressure reading    Fibroids    Hashimoto's disease    Hyperlipidemia    Rectal hemorrhage 05/17/2017   Seasonal affective disorder (HCC)    PMH of  Past Surgical History:  Procedure Laterality Date   ANAL FISTULOTOMY  2019   APPENDECTOMY  09/2020   CERVICAL BIOPSY  W/ LOOP ELECTRODE EXCISION  2002   LAPAROSCOPIC ASSISTED VAGINAL HYSTERECTOMY N/A 02/10/2014   Procedure: LAPAROSCOPIC ASSISTED VAGINAL HYSTERECTOMY;  Surgeon: Leslie Andrea, MD;  Location: [facility];  Service: Gynecology;  Laterality: N/A;   SALPINGOOPHORECTOMY Bilateral 02/10/2014   Procedure: SALPINGO OOPHORECTOMY;  Surgeon: Leslie Andrea, MD;  Location: [facility];  Service: Gynecology;  Laterality: Bilateral;   TOTAL HIP ARTHROPLASTY Right 10/07/2018   Procedure: TOTAL HIP ARTHROPLASTY ANTERIOR APPROACH;  Surgeon: Durene Romans, MD;  Location: [facility];  Service: Orthopedics;  Laterality: Right;  70 mins   Family History  Problem Relation Age of Onset   Diabetes Mother    Asthma Mother    Seasonal affective disorder Mother    Clotting disorder Mother        excess platelets   COPD Mother    Cancer Mother    Hyperlipidemia Mother    Stroke Mother    Kidney cancer Father     Hypertension Father    Cancer Father        kidney   Heart disease Father    Hyperlipidemia Maternal Grandmother    Stroke Maternal Grandmother 69       mini strokes   Pancreatic cancer Paternal Grandmother    Heart failure Paternal Grandmother    Heart failure Maternal Grandfather        MI @ 60   Alzheimer's disease Paternal Grandfather    Seasonal affective disorder Sister    GI problems Brother        GI bleed   Heart disease Paternal Uncle    Allergies as of 06/05/2023       Reactions   Atorvastatin    myalgia   Rosuvastatin    myalgias   Statins Other (See Comments)   Myalgia, Achilles tendonitis   Adhesive [tape] Rash   Sometimes peels skin off   Latex Rash   Sometimes peels skin off   Nickel Rash        Medication List        Accurate as of June 05, 2023 10:45 AM. If you have any questions, ask your nurse or doctor.          STOP taking these medications    atovaquone-proguanil 250-100 MG Tabs tablet Commonly known as: MALARONE Stopped by: Felix Pacini       TAKE these medications    buPROPion 150 MG 24 hr tablet Commonly known as: WELLBUTRIN XL Take 1 tablet (150 mg total) by mouth daily.   FreeStyle Libre 3 Plus Sensor Misc Inject 1 Device into the skin continuous. Change every 15 days   FreeStyle Libre 3 Reader [facility] Use to check glucose continuously   meloxicam 7.5 MG tablet Commonly known as: MOBIC Take 7.5 mg by mouth as needed.   metFORMIN 500 MG tablet Commonly known as: GLUCOPHAGE Take 1 tablet (500 mg total) by mouth 2 (two) times daily with a meal.   Repatha SureClick 140 MG/ML Soaj Generic drug: Evolocumab INJECT 140MG  SUBCUTANEOUSLY  EVERY 2 WEEKS   Synthroid 125 MCG tablet Generic drug: levothyroxine Take 1 tablet (125 mcg total) by mouth daily before breakfast.   Vitamin D3 125 MCG (5000 UT) Tabs Take by mouth.        All past medical history, surgical history, allergies, family history, immunizations  andmedications were updated in the  EMR today and reviewed under the history and medication portions of their EMR.     ROS Negative, with the exception of above mentioned in HPI   Objective:  BP 124/70   Pulse 75   Temp 97.6 F (36.4 C)   Wt 236 lb 9.6 oz (107.3 kg)   LMP 02/06/2012   SpO2 97%   BMI 35.97 kg/m  Body mass index is 35.97 kg/m. Physical Exam Vitals and nursing note reviewed.  Constitutional:      General: She is not in acute distress.    Appearance: Normal appearance. She is normal weight. She is not ill-appearing or toxic-appearing.  HENT:     Head: Normocephalic and atraumatic.  Eyes:     General: No scleral icterus.       Right eye: No discharge.        Left eye: No discharge.     Extraocular Movements: Extraocular movements intact.     Conjunctiva/sclera: Conjunctivae normal.     Pupils: Pupils are equal, round, and reactive to light.  Cardiovascular:     Rate and Rhythm: Normal rate and regular rhythm.  Abdominal:     General: Abdomen is flat. There is no distension.     Palpations: Abdomen is soft.  Skin:    Findings: No rash.  Neurological:     Mental Status: She is alert and oriented to person, place, and time. Mental status is at baseline.     Motor: No weakness.     Coordination: Coordination normal.     Gait: Gait normal.  Psychiatric:        Mood and Affect: Mood normal.        Behavior: Behavior normal.        Thought Content: Thought content normal.        Judgment: Judgment normal.    No results found. No results found. No results found for this or any previous visit (from the past 24 hour(s)).  Assessment/Plan: Allison Compton is a 57 y.o. female present for OV for  Traveler's diarrhea Has since resolved. Had [place] in [place].  Do not feel further testing is warranted or would change outcome.   Reviewed expectations re: course of current medical issues. Discussed self-management of symptoms. Outlined signs and  symptoms indicating need for more acute intervention. Patient verbalized understanding and all questions were answered. Patient received an After-Visit Summary.    No orders of the defined types were placed in this encounter.  Meds ordered this encounter  Medications   buPROPion (WELLBUTRIN XL) 150 MG 24 hr tablet    Sig: Take 1 tablet (150 mg total) by mouth daily.    Dispense:  90 tablet    Refill:  3   Referral Orders  No referral(s) requested today     Note is dictated utilizing voice recognition software. Although note has been proof read prior to signing, occasional typographical errors still can be missed. If any questions arise, please do not hesitate to call for verification.   electronically signed by:  Felix Pacini, DO  [facility] [place]

## 2023-06-05 NOTE — Telephone Encounter (Signed)
*  STAT* If patient is at the pharmacy, call can be transferred to refill team.   1. Which medications need to be refilled? (please list name of each medication and dose if known)  REPATHA SURECLICK 140 MG/ML SOAJ    2. Which pharmacy/location (including street and city if local pharmacy) is medication to be sent to? [facility] - [place], [place] - [address]    3. Do they need a 30 day or 90 day supply? 90 day

## 2023-06-06 MED ORDER — REPATHA SURECLICK 140 MG/ML ~~LOC~~ SOAJ: 140.0000 mg | SUBCUTANEOUS | 3 refills | Status: DC

## 2023-06-13 ENCOUNTER — Telehealth: Payer: Self-pay | Admitting: Cardiology

## 2023-06-13 ENCOUNTER — Encounter: Payer: Self-pay | Admitting: Internal Medicine

## 2023-06-13 NOTE — Telephone Encounter (Signed)
*  STAT* If patient is at the pharmacy, call can be transferred to refill team.   1. Which medications need to be refilled? (please list name of each medication and dose if known) Evolocumab (REPATHA SURECLICK) 140 MG/ML SOAJ   2. Which pharmacy/location (including street and city if local pharmacy) is medication to be sent to?  [id number] - [place], [place] - [address]    3. Do they need a 30 day or 90 day supply? 90

## 2023-06-14 NOTE — Telephone Encounter (Signed)
Clinical info has been submitted

## 2023-06-21 NOTE — Telephone Encounter (Signed)
Pharmacy Patient Advocate Encounter  Received notification from [facility] that Prior Authorization for [facility] 3 plus has been DENIED.  Full denial letter will be uploaded to the media tab. See denial reason below.

## 2023-06-24 NOTE — Telephone Encounter (Signed)
We  can try the [place] 2... Ty! C

## 2023-06-24 NOTE — Telephone Encounter (Signed)
Please Advise,  Allison Compton 3 plus has been denied,

## 2023-06-26 MED ORDER — FREESTYLE LIBRE 2 SENSOR MISC: 1.0000 | 3 refills | Status: DC

## 2023-06-26 NOTE — Addendum Note (Signed)
Addended by: Pollie Meyer on: 06/26/2023 09:24 AM   Modules accepted: Orders

## 2023-07-22 ENCOUNTER — Ambulatory Visit: Payer: 59 | Admitting: Internal Medicine

## 2023-07-22 ENCOUNTER — Encounter: Payer: Self-pay | Admitting: Internal Medicine

## 2023-07-22 VITALS — BP 120/60 | HR 79 | Ht 68.0 in | Wt 238.2 lb

## 2023-07-22 DIAGNOSIS — E1165 Type 2 diabetes mellitus with hyperglycemia: Secondary | ICD-10-CM | POA: Diagnosis not present

## 2023-07-22 DIAGNOSIS — E785 Hyperlipidemia, unspecified: Secondary | ICD-10-CM | POA: Diagnosis not present

## 2023-07-22 DIAGNOSIS — Z7984 Long term (current) use of oral hypoglycemic drugs: Secondary | ICD-10-CM | POA: Diagnosis not present

## 2023-07-22 DIAGNOSIS — E063 Autoimmune thyroiditis: Secondary | ICD-10-CM | POA: Diagnosis not present

## 2023-07-22 DIAGNOSIS — E559 Vitamin D deficiency, unspecified: Secondary | ICD-10-CM | POA: Diagnosis not present

## 2023-07-22 LAB — POCT GLYCOSYLATED HEMOGLOBIN (HGB A1C): Hemoglobin A1C: 7.3 % — AB (ref 4.0–5.6)

## 2023-07-22 MED ORDER — METFORMIN HCL 1000 MG PO TABS: 1000.0000 mg | ORAL_TABLET | Freq: Two times a day (BID) | ORAL | 3 refills | Status: DC

## 2023-07-22 MED ORDER — FREESTYLE LIBRE 2 SENSOR MISC: 1.0000 | 3 refills | Status: DC

## 2023-07-22 NOTE — Patient Instructions (Addendum)
Please continue Synthroid 125 mcg daily.  Take the thyroid hormone every day, with water, at least 30 minutes before breakfast, separated by at least 4 hours from: - acid reflux medications - calcium - iron - multivitamins  Increase Metformin to 1000 mg 2x a day.  Please restart vitamin 5000 units daily.  Please stop at the lab.  Please come back for a follow-up appointment in 4 months.

## 2023-07-22 NOTE — Progress Notes (Signed)
Patient ID: Allison Compton, female   DOB: 08/19/1965, 57 y.o.   MRN: [id number]  HPI  Allison Compton is a 57 y.o.-year-old female, returning for f/u for Hashimoto's hypothyroidism, DM2, and vitamin D deficiency. Last visit 8 mo ago.  Interim history: No increased urination, blurry vision, nausea, chest pain. She was let go from work before last visit, after 28 years >> stress eating and cravings.  She restarted Wellbutrin which was helping with cravings in the past. She went to [place] earlier this year. She developed traveler's diarrhea, and had to get fluids and antibiotics. She relaxed her diet recently.  Pt. was dx with hypothyroidism in 04/2015.  She is on Synthroid d.a.w. 125 mcg daily: - in am - fasting - at least 30 min from b'fast - + calcium at bedtime - no iron - no multivitamins - no PPIs - not on Biotin  Reviewed her TFTs: Lab Results  Component Value Date   TSH 1.09 02/13/2023   TSH 1.40 07/25/2022   TSH 0.92 02/05/2022   TSH 1.66 07/26/2021   TSH 1.13 01/19/2021   TSH 1.53 06/03/2020   TSH 3.93 07/17/2019   TSH 2.14 06/13/2018   TSH 3.57 01/07/2018   TSH 0.90 05/17/2017   FREET4 1.16 07/25/2022   FREET4 1.13 07/26/2021   FREET4 1.26 01/19/2021   FREET4 1.03 06/03/2020   FREET4 0.99 07/17/2019   FREET4 1.04 06/13/2018   FREET4 0.99 01/07/2018   FREET4 1.5 05/17/2017   FREET4 1.28 11/20/2016   FREET4 1.29 07/27/2016    She has Hashimoto's thyroiditis: Component     Latest Ref Rng & Units 01/07/2018 06/13/2018  Thyroperoxidase Ab SerPl-aCnc     <9 IU/mL 96 (H) 139 (H)  Thyroglobulin Ab     < or = 1 IU/mL 1 <1   Component     Latest Ref Rng & Units 05/30/2015 05/28/2016 11/20/2016  Thyroperoxidase Ab SerPl-aCnc     <9 IU/mL 782 (H) 652 (H) 328 (H)  Thyroglobulin Ab     <2 IU/mL 2 (H) 1   Previously on low-dose selenium.  Pt denies: - feeling nodules in neck - hoarseness - dysphagia - choking  She has + FH of thyroid disorders in: several  cousins. No FH of thyroid cancer. No h/o radiation tx to head or neck. No herbal supplements. No Biotin use. No recent steroids use.   DM2: - dx'ed 10/2020 - She was started on metformin 500 mg twice a day by PCP in 10/2020.  She tolerates this well.  Reviewed HbA1c levels: Lab Results  Component Value Date   HGBA1C 7.1 (H) 02/13/2023   HGBA1C 6.5 07/25/2022   HGBA1C 6.4 (H) 02/05/2022   HGBA1C 6.3 (A) 06/14/2021   HGBA1C 6.3 06/14/2021   HGBA1C 6.3 06/14/2021   HGBA1C 6.3 06/14/2021   HGBA1C 6.5 (A) 01/19/2021   HGBA1C 6.7 (H) 10/13/2020   HGBA1C 6.8 (H) 06/03/2020   HGBA1C 6.4 07/17/2019   HGBA1C 6.0 (H) 09/26/2018   HGBA1C 6.3 06/13/2018   HGBA1C 6.4 01/07/2018   HGBA1C 5.8 (H) 05/17/2017   HGBA1C 6.2 11/20/2016   HGBA1C 6.0 05/28/2016   HGBA1C 6.3 12/19/2015   HGBA1C 6.0 09/30/2015   HGBA1C 5.4 04/19/2015   No CKD: Lab Results  Component Value Date   BUN 15 02/13/2023   Lab Results  Component Value Date   CREATININE 0.81 02/13/2023   No MAU: Lab Results  Component Value Date   MICRALBCREAT 1.1 07/25/2022   MICRALBCREAT 0.9 06/14/2021  MICRALBCREAT 1.3 07/17/2019   MICRALBCREAT 21.5 09/29/2008   MICRALBCREAT 2.0 02/17/2008   Last foot exam 12/2022.  Last eye exam 2023.  + HL: Lab Results  Component Value Date   CHOL 141 02/13/2023   HDL 61.70 02/13/2023   LDLCALC 54 02/13/2023   LDLDIRECT 139.9 02/17/2008   TRIG 127.0 02/13/2023   CHOLHDL 2 02/13/2023  We tried to start Atorvastatin but she developed mm aches and Achilles tendinitis (had to have sx) and we stopped.  Currently on Repatha, off ezetimibe - per the Lipid clinic.  In the past, she was on a gluten and dairy free diet and her weight and TPO antibodies improved.  Afterwards, she tried a keto diet but TPO antibodies worsened.  She has a history of vitamin D deficiency: Lab Results  Component Value Date   VD25OH 36.14 02/13/2023   VD25OH 44.11 07/25/2022   VD25OH 39.13 07/26/2021    VD25OH 45.64 01/24/2021   VD25OH 30.06 10/13/2020   VD25OH 26.99 (L) 06/03/2020   VD25OH 27.13 (L) 07/17/2019   VD25OH 36 05/17/2017   VD25OH 48.22 11/20/2016   VD25OH 25.96 (L) 05/28/2016   She was on a vitamin D supplement: 5000 units daily - but off for few weeks.  She also has a history of hysterectomy in 2015. She had THR in 09/28/18. She had a calcium score of 0 in 08/2020. She had appendicitis 09/24/2020 and had to have laparoscopic appendectomy.  Her husband passed away in 09/28/20 from metastatic prostate cancer.  ROS: + see HPI  I reviewed pt's medications, allergies, PMH, social hx, family hx, and changes were documented in the history of present illness. Otherwise, unchanged from my initial visit note.  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   DYSPLASIA, VAGINA 06/09/2007   Qualifier: History of  By: Rosilyn Mings RN, Kimberly     Eczema    Edema of both legs    Elevated blood pressure reading    Fibroids    Hashimoto's disease    Hyperlipidemia    Rectal hemorrhage 05/17/2017   Seasonal affective disorder (HCC)    PMH of   Past Surgical History:  Procedure Laterality Date   ANAL FISTULOTOMY  2019   APPENDECTOMY  09/28/2020   CERVICAL BIOPSY  W/ LOOP ELECTRODE EXCISION  2002   LAPAROSCOPIC ASSISTED VAGINAL HYSTERECTOMY N/A 02/10/2014   Procedure: LAPAROSCOPIC ASSISTED VAGINAL HYSTERECTOMY;  Surgeon: Leslie Andrea, MD;  Location: [facility];  Service: Gynecology;  Laterality: N/A;   SALPINGOOPHORECTOMY Bilateral 02/10/2014   Procedure: SALPINGO OOPHORECTOMY;  Surgeon: Leslie Andrea, MD;  Location: [facility];  Service: Gynecology;  Laterality: Bilateral;   TOTAL HIP ARTHROPLASTY Right 10/07/2018   Procedure: TOTAL HIP ARTHROPLASTY ANTERIOR APPROACH;  Surgeon: Durene Romans, MD;  Location: [facility];  Service: Orthopedics;  Laterality: Right;  70 mins   Social History   Social  History   Marital Status: Married    Spouse Name: N/A   Number of Children: 1   Occupational History   n/a   Social History Main Topics   Smoking status: Former Smoker    Quit date: 08/13/1985   Smokeless tobacco: Not on file     Comment: age [age], up to 1 ppd   Alcohol Use: Yes     Comment: rarely once a month or less   Drug Use: No  Current Outpatient Medications on File Prior to Visit  Medication Sig Dispense Refill   buPROPion (WELLBUTRIN XL) 150 MG 24 hr tablet Take 1 tablet (150 mg total) by mouth daily. 90 tablet 3   Cholecalciferol (VITAMIN D3) 125 MCG (5000 UT) TABS Take by mouth.     Continuous Glucose Receiver (FREESTYLE LIBRE 3 READER) DEVI Use to check glucose continuously 1 each 0   Continuous Glucose Sensor (FREESTYLE LIBRE 2 SENSOR) MISC 1 each by Does not apply route every 14 (fourteen) days. 6 each 3   Evolocumab (REPATHA SURECLICK) 140 MG/ML SOAJ Inject 140 mg into the skin every 14 (fourteen) days. 6 mL 3   meloxicam (MOBIC) 7.5 MG tablet Take 7.5 mg by mouth as needed.     metFORMIN (GLUCOPHAGE) 500 MG tablet Take 1 tablet (500 mg total) by mouth 2 (two) times daily with a meal. 180 tablet 2   SYNTHROID 125 MCG tablet Take 1 tablet (125 mcg total) by mouth daily before breakfast. 90 tablet 3   No current facility-administered medications on file prior to visit.   Allergies  Allergen Reactions   Atorvastatin     myalgia   Rosuvastatin     myalgias   Statins Other (See Comments)    Myalgia, Achilles tendonitis   Adhesive [Tape] Rash    Sometimes peels skin off   Latex Rash    Sometimes peels skin off   Nickel Rash   Family History  Problem Relation Age of Onset   Diabetes Mother    Asthma Mother    Seasonal affective disorder Mother    Clotting disorder Mother        excess platelets   COPD Mother    Cancer Mother    Hyperlipidemia Mother    Stroke Mother    Kidney cancer Father    Hypertension Father    Cancer Father        kidney    Heart disease Father    Hyperlipidemia Maternal Grandmother    Stroke Maternal Grandmother 78       mini strokes   Pancreatic cancer Paternal Grandmother    Heart failure Paternal Grandmother    Heart failure Maternal Grandfather        MI @ 67   Alzheimer's disease Paternal Grandfather    Seasonal affective disorder Sister    GI problems Brother        GI bleed   Heart disease Paternal Uncle    PE: BP 120/60   Pulse 79   Ht 5\' 8"  (1.727 m)   Wt 238 lb 3.2 oz (108 kg)   LMP 02/06/2012   SpO2 96%   BMI 36.22 kg/m  Wt Readings from Last 3 Encounters:  07/22/23 238 lb 3.2 oz (108 kg)  06/05/23 236 lb 9.6 oz (107.3 kg)  03/05/23 240 lb 6.4 oz (109 kg)   Constitutional: overweight, in NAD Eyes:  EOMI, no exophthalmos ENT: no neck masses, no cervical lymphadenopathy Cardiovascular: RRR, No MRG Respiratory: CTA B Musculoskeletal: no deformities Skin:no rashes Neurological: no tremor with outstretched hands  ASSESSMENT: 1. Hypothyroidism - Thyroid U/S (06/13/2015): Thyroid tissue is mildly heterogeneous but no focal nodules.  2. DM2  3.  Hyperlipidemia  4. Vit D def.  5. Obesity class 2  PLAN:  1. Patient with Hashimoto's hypothyroidism, on name Synthroid (from [facility] in [place]) - latest thyroid labs reviewed with pt. >> normal: Lab Results  Component Value Date   TSH 1.09 02/13/2023  - she  continues on LT4 125 mcg daily - pt feels good on this dose. - we discussed about taking the thyroid hormone every day, with water, >30 minutes before breakfast, separated by >4 hours from acid reflux medications, calcium, iron, multivitamins. Pt. is taking it correctly.  2.  Type 2 diabetes -Saw nutritionist through her work -She was previously talking to a counselor to help her control her eating.  She was eliminating sweets and I also advised her to reduce fatty foods.  -She was started on metformin 500 mg twice a day by PCP in 10/2020.  She tolerates this  well -Right before our last visit, HbA1c was higher, at 7.1%. -At last visit she was off her freestyle libre 3 sensor as this was declined by her insurance.  I sent another prescription at last visit.  Due to national backorder, we tried to get her on the freestyle libre 3+ sensor but this was again declined by her insurance.  We then sent the prescription for the freestyle libre 2 sensor.  She was not able to obtain this.  We called in again today. -HbA1c today is 7.3% (higher) -At today's visit, we will continue metformin but I did advise her to increase the dose to 1000 mg twice a day, with meals -Will have her back sooner, in 4 months, for a recheck.  We may need to intensify the treatment at that time.  3.  Hyperlipidemia -Reviewed latest lipid panel from 02/2023: All fractions at goal Lab Results  Component Value Date   CHOL 141 02/13/2023   HDL 61.70 02/13/2023   LDLCALC 54 02/13/2023   LDLDIRECT 139.9 02/17/2008   TRIG 127.0 02/13/2023   CHOLHDL 2 02/13/2023  -We tried to use a statin but she developed myalgias and Achilles tendinitis-she had to have physical therapy for this the pain continued for several months.  This resolved after stopping the statin -She is seen in the lipid clinic-on Repatha  4. Vit D def. -Latest vitamin D level was reviewed and this was normal, Lab Results  Component Value Date   VD25OH 36.14 02/13/2023  -Will continue vitamin D3 5000 units daily  5. Obesity class 2 - gained 11 lbs before last visit -At that time she declined a referral to the weight management clinic as she was planning to work on her diet and also exercise.  I recommended to do a complete fast (except for noncaloric drinks) 1 day a week.  We discussed about the fasting mimicking diet. -At today's visit, she lost 2 pounds since last visit -Increasing metformin should also help with controlling appetite.  Carlus Pavlov, MD PhD [facility]

## 2023-07-23 ENCOUNTER — Other Ambulatory Visit: Payer: Self-pay | Admitting: Internal Medicine

## 2023-07-23 LAB — MICROALBUMIN / CREATININE URINE RATIO
Creatinine, Urine: 101 mg/dL (ref 20–275)
Microalb Creat Ratio: 3 mg/g{creat} (ref <30)
Microalb, Ur: 0.3 mg/dL

## 2023-07-26 ENCOUNTER — Telehealth: Payer: Self-pay | Admitting: Cardiology

## 2023-07-26 MED ORDER — REPATHA SURECLICK 140 MG/ML ~~LOC~~ SOAJ: 140.0000 mg | SUBCUTANEOUS | 3 refills | Status: DC

## 2023-07-26 NOTE — Telephone Encounter (Signed)
Pt c/o medication issue:  1. Name of Medication:  Evolocumab (REPATHA SURECLICK) 140 MG/ML SOAJ   2. How are you currently taking this medication (dosage and times per day)? As prescribed   3. Are you having a reaction (difficulty breathing--STAT)? No   4. What is your medication issue? Patient is calling regarding need Repatha. Last request was denied. Patient states [facility] does not have this medication in stock so she is needing it sent to [facility]. Patient is on her last pen that she will be using next friday. Please advise.

## 2023-07-26 NOTE — Telephone Encounter (Signed)
Medication was sent to [facility].

## 2023-08-16 ENCOUNTER — Ambulatory Visit: Payer: 59

## 2023-08-23 ENCOUNTER — Ambulatory Visit (INDEPENDENT_AMBULATORY_CARE_PROVIDER_SITE_OTHER): Payer: 59

## 2023-08-23 ENCOUNTER — Ambulatory Visit: Payer: 59

## 2023-08-23 DIAGNOSIS — Z23 Encounter for immunization: Secondary | ICD-10-CM | POA: Diagnosis not present

## 2023-08-23 NOTE — Progress Notes (Signed)
 Allison Compton is a 58 y.o. female presents to the office today for Twinrix #3, per physician's orders. Original order: 02/13/2023 Twinrix 1 mL,  IM was administered L Deltoid today. Patient tolerated injection. Patient due for follow up labs/provider appt: No.  Patient next injection due: n/a.  (Dr Charlies Bellini pt) DOD: Amon Idol, [name]

## 2023-08-27 DIAGNOSIS — H8102 Meniere's disease, left ear: Secondary | ICD-10-CM | POA: Insufficient documentation

## 2023-08-30 DIAGNOSIS — H9042 Sensorineural hearing loss, unilateral, left ear, with unrestricted hearing on the contralateral side: Secondary | ICD-10-CM | POA: Insufficient documentation

## 2023-09-12 ENCOUNTER — Other Ambulatory Visit (HOSPITAL_COMMUNITY): Payer: Self-pay

## 2023-09-23 ENCOUNTER — Telehealth: Payer: Self-pay | Admitting: Pharmacy Technician

## 2023-09-23 ENCOUNTER — Other Ambulatory Visit (HOSPITAL_COMMUNITY): Payer: Self-pay

## 2023-09-23 NOTE — Telephone Encounter (Signed)
 Pharmacy Patient Advocate Encounter   Received notification from [employer] that prior authorization for FreeStyle Libre 2 Sensor is required/requested.   Insurance verification completed.   The patient is insured through [employer] .   Per test claim: PA required; PA started via CoverMyMeds. KEY [id number] . Waiting for clinical questions to populate.

## 2023-09-24 NOTE — Telephone Encounter (Signed)
Pharmacy Patient Advocate Encounter  Received notification from [employer] [facility] that Prior Authorization for Allison Compton has been DENIED.  Full denial letter will be uploaded to the media tab. See denial reason below.

## 2023-09-24 NOTE — Telephone Encounter (Signed)
Clinical questions have been answered and PA submitted. PA currently Pending.  Based on what they are asking, she doesn't qualify. I see that she got a 3 month supply in December, so it may be that her ins just covers it now, without a PA. Although, if this was the case, I expected the ins. to come back and say that when I initially submitted the request.  She doesn't use a pump or insulin and isn't having level 2 hypoglycemic events with her glucose below 54 mg/dL, nor level 3 events with altered mental and/or physical status. Since she's been on the [place] & got it in Dec, I'm going ahead and submitting the request, but I just wanted to let you know it may not be approved.

## 2023-09-29 NOTE — Progress Notes (Unsigned)
Cardiology Office Note:    Date:  10/03/2023   ID:  Allison Compton, DOB [birth year], MRN [id number]  PCP:  Natalia Leatherwood, DO  Cardiologist:  None  Electrophysiologist:  None   Referring MD: Natalia Leatherwood, DO   Chief Complaint  Patient presents with   Coronary Artery Disease     History of Present Illness:    Allison Compton is a 58 y.o. female with a hx of CAD, hyperlipidemia, diabetes who presents for follow-up.  She was referred by Dr. Claiborne Billings for evaluation of chest pain, initially seen on 07/19/2020.   Coronary CTA on 08/26/2020 showed nonobstructive CAD with noncalcified plaque in the proximal LAD causing mild (25 to 49%) stenosis, calcium score 0.  Echocardiogram on 08/11/2020 showed normal biventricular function, no significant valvular disease.  Since last clinic visit, she reports she is doing well.  Denies any chest pain, dyspnea, lower extremity edema, or palpitations.  Recovering from recent episode of flu.  Reports some lightheadedness but denies any syncope.  She does yard work for exercise but not exercising regularly.   Wt Readings from Last 3 Encounters:  10/03/23 230 lb 12.8 oz (104.7 kg)  07/22/23 238 lb 3.2 oz (108 kg)  06/05/23 236 lb 9.6 oz (107.3 kg)     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   DYSPLASIA, VAGINA 06/09/2007   Qualifier: History of  By: Rosilyn Mings RN, Kimberly     Eczema    Edema of both legs    Elevated blood pressure reading    Fibroids    Hashimoto's disease    Hyperlipidemia    Rectal hemorrhage 05/17/2017   Seasonal affective disorder (HCC)    PMH of    Past Surgical History:  Procedure Laterality Date   ANAL FISTULOTOMY  2019   APPENDECTOMY  09/2020   CERVICAL BIOPSY  W/ LOOP ELECTRODE EXCISION  2002   LAPAROSCOPIC ASSISTED VAGINAL HYSTERECTOMY N/A 02/10/2014   Procedure: LAPAROSCOPIC ASSISTED VAGINAL HYSTERECTOMY;  Surgeon:  Leslie Andrea, MD;  Location: [facility];  Service: Gynecology;  Laterality: N/A;   SALPINGOOPHORECTOMY Bilateral 02/10/2014   Procedure: SALPINGO OOPHORECTOMY;  Surgeon: Leslie Andrea, MD;  Location: [facility];  Service: Gynecology;  Laterality: Bilateral;   TOTAL HIP ARTHROPLASTY Right 10/07/2018   Procedure: TOTAL HIP ARTHROPLASTY ANTERIOR APPROACH;  Surgeon: Durene Romans, MD;  Location: [facility];  Service: Orthopedics;  Laterality: Right;  70 mins    Current Medications: Current Meds  Medication Sig   buPROPion (WELLBUTRIN XL) 150 MG 24 hr tablet Take 1 tablet (150 mg total) by mouth daily.   Cholecalciferol (VITAMIN D3) 125 MCG (5000 UT) TABS Take by mouth.   Continuous Glucose Sensor (FREESTYLE LIBRE 2 SENSOR) MISC 1 each by Does not apply route every 14 (fourteen) days.   meloxicam (MOBIC) 7.5 MG tablet Take 7.5 mg by mouth as needed.   metFORMIN (GLUCOPHAGE) 1000 MG tablet Take 1 tablet (1,000 mg total) by mouth 2 (two) times daily with a meal.   SYNTHROID 125 MCG tablet TAKE 1 TABLET (125 MCG TOTAL) DAILY BEFORE BREAKFAST   [DISCONTINUED] Evolocumab (REPATHA SURECLICK) 140 MG/ML SOAJ Inject 140 mg into the skin every 14 (fourteen) days.     Allergies:   Atorvastatin, Rosuvastatin, Statins, Adhesive [tape], Latex, and Nickel   Social History   Socioeconomic History   Marital  status: Widowed    Spouse name: Not on file   Number of children: Not on file   Years of education: Not on file   Highest education level: Bachelor's degree (e.g., BA, AB, BS)  Occupational History   Not on file  Tobacco Use   Smoking status: Former    Current packs/day: 0.00    Types: Cigarettes    Quit date: 08/13/1985    Years since quitting: 38.1   Smokeless tobacco: Never   Tobacco comments:    age [age], up to 1 ppd  Vaping Use   Vaping status: Never Used  Substance and Sexual Activity   Alcohol use: Not Currently   Drug use: No   Sexual activity: Never    Birth control/protection:  Surgical  Other Topics Concern   Not on file  Social History Narrative   Married, [place] 1 adult son Eliberto Ivory).    BA degree, Transport planner.    Drinks caffeine occasionally, uses herbal remedies.   Wears her seatbelt, smoke detector in the home.   Firearms in the home, locked cabinet.   Feels safe in her relationships.         Epworth Sleepiness Scale = 6 (as of 05/09/2016)   Social Drivers of Health   Financial Resource Strain: Low Risk  (12/28/2022)   Overall Financial Resource Strain (CARDIA)    Difficulty of Paying Living Expenses: Not hard at all  Food Insecurity: Low Risk  (08/30/2023)   Received from [facility]   Hunger Vital Sign    Worried About Running Out of Food in the Last Year: Never true    Ran Out of Food in the Last Year: Never true  Transportation Needs: No Transportation Needs (08/30/2023)   Received from [facility]    In the past 12 months, has lack of reliable transportation kept you from medical appointments, meetings, work or from getting things needed for daily living? : No  Physical Activity: Insufficiently Active (12/28/2022)   Exercise Vital Sign    Days of Exercise per Week: 2 days    Minutes of Exercise per Session: 20 min  Stress: No Stress Concern Present (12/28/2022)   [employer] of Occupational Health - Occupational Stress Questionnaire    Feeling of Stress : Only a little  Social Connections: Moderately Integrated (12/28/2022)   Social Connection and Isolation Panel [NHANES]    Frequency of Communication with Friends and Family: More than three times a week    Frequency of Social Gatherings with Friends and Family: Twice a week    Attends Religious Services: 1 to 4 times per year    Active Member of [employer] or Organizations: Yes    Attends Banker Meetings: More than 4 times per year    Marital Status: Widowed     Family History: The patient's family history includes Alzheimer's disease in her  paternal grandfather; Asthma in her mother; COPD in her mother; Cancer in her father and mother; Clotting disorder in her mother; Diabetes in her mother; GI problems in her brother; Heart disease in her father and paternal uncle; Heart failure in her maternal grandfather and paternal grandmother; Hyperlipidemia in her maternal grandmother and mother; Hypertension in her father; Kidney cancer in her father; Pancreatic cancer in her paternal grandmother; Seasonal affective disorder in her mother and sister; Stroke in her mother; Stroke (age of onset: 58) in her maternal grandmother.  ROS:   Please see the history of present illness.  All other systems reviewed and are negative.  EKGs/Labs/Other Studies Reviewed:    The following studies were reviewed today:   EKG:   09/25/22: Normal sinus rhythm, rate 68, nonspecific T wave flattening 10/03/2023: Normal sinus rhythm, rate 75, no ST abnormality  Recent Labs: 02/13/2023: ALT 29; BUN 15; Creatinine, Ser 0.81; Hemoglobin 13.9; Platelets 253.0; Potassium 4.3; Sodium 139; TSH 1.09  Recent Lipid Panel    Component Value Date/Time   CHOL 141 02/13/2023 1028   CHOL 263 (H) 07/17/2019 0000   TRIG 127.0 02/13/2023 1028   HDL 61.70 02/13/2023 1028   HDL 58 07/17/2019 0000   CHOLHDL 2 02/13/2023 1028   VLDL 25.4 02/13/2023 1028   LDLCALC 54 02/13/2023 1028   LDLCALC 182 (H) 07/17/2019 0000   LDLDIRECT 139.9 02/17/2008 0852    Physical Exam:    VS:  BP 122/72   Pulse 75   Ht 5' 9.6" (1.768 m)   Wt 230 lb 12.8 oz (104.7 kg)   LMP 02/06/2012   SpO2 97%   BMI 33.50 kg/m     Wt Readings from Last 3 Encounters:  10/03/23 230 lb 12.8 oz (104.7 kg)  07/22/23 238 lb 3.2 oz (108 kg)  06/05/23 236 lb 9.6 oz (107.3 kg)     GEN: Well nourished, well developed in no acute distress HEENT: Normal NECK: No JVD; No carotid bruits CARDIAC: RRR, no murmurs, rubs, gallops RESPIRATORY:  Clear to auscultation without rales, wheezing or rhonchi   ABDOMEN: Soft, non-tender, non-distended MUSCULOSKELETAL:  No edema; No deformity  SKIN: Warm and dry NEUROLOGIC:  Alert and oriented x 3 PSYCHIATRIC:  Normal affect   ASSESSMENT:    1. Coronary artery disease involving native coronary artery of native heart without angina pectoris   2. Hyperlipidemia, unspecified hyperlipidemia type       PLAN:    CAD: Reported atypical chest pain.  Coronary CTA on 08/26/2020 showed nonobstructive CAD with noncalcified plaque in the proximal LAD causing mild (25 to 49%) stenosis, calcium score 0.  Echocardiogram on 08/11/2020 showed normal biventricular function, no significant valvular disease. -Reports no recent chest pain -Continue Repatha  Hyperlipidemia:  LDL 160 on 06/03/2020.  Unable to tolerate atorvastatin due to myalgias/Achilles tendinitis.  Started on Zetia 10 mg daily, LDL improved to 120 on 10/13/2020.  Low-dose rosuvastatin was added, but unable to tolerate.  She was referred to lipid clinic and started on Repatha.  LDL 18 on 01/19/2021.  Zetia was stopped.  LDL 54 on 02/13/23   T2DM: A1c 7.3% on 07/22/2023.  On metformin  RTC in 1 year   Medication Adjustments/Labs and Tests Ordered: Current medicines are reviewed at length with the patient today.  Concerns regarding medicines are outlined above.  Orders Placed This Encounter  Procedures   EKG 12-Lead    Meds ordered this encounter  Medications   Evolocumab (REPATHA SURECLICK) 140 MG/ML SOAJ    Sig: Inject 140 mg into the skin every 14 (fourteen) days.    Dispense:  6 mL    Refill:  3    Requesting 1 year supply     Patient Instructions  Medication Instructions:  Continue current medications *If you need a refill on your cardiac medications before your next appointment, please call your pharmacy*   Lab Work: none If you have labs (blood work) drawn today and your tests are completely normal, you will receive your results only by: MyChart Message (if you have MyChart)  OR A paper copy in the mail  If you have any lab test that is abnormal or we need to change your treatment, we will call you to review the results.   Testing/Procedures: none   Follow-Up: At [facility], you and your health needs are our priority.  As part of our continuing mission to provide you with exceptional heart care, we have created designated Provider Care Teams.  These Care Teams include your primary Cardiologist (physician) and Advanced Practice Providers (APPs -  Physician Assistants and Nurse Practitioners) who all work together to provide you with the care you need, when you need it.  We recommend signing up for the patient portal called "MyChart".  Sign up information is provided on this After Visit Summary.  MyChart is used to connect with patients for Virtual Visits (Telemedicine).  Patients are able to view lab/test results, encounter notes, upcoming appointments, etc.  Non-urgent messages can be sent to your provider as well.   To learn more about what you can do with MyChart, go to [email or web address].    Your next appointment:   1 year(s)  Provider:   Dr. Bjorn Pippin  Other Instructions none       Signed, Little Ishikawa, MD  10/03/2023 4:46 PM    [facility]

## 2023-10-03 ENCOUNTER — Ambulatory Visit: Payer: 59 | Attending: Cardiology | Admitting: Cardiology

## 2023-10-03 ENCOUNTER — Encounter: Payer: Self-pay | Admitting: Cardiology

## 2023-10-03 VITALS — BP 122/72 | HR 75 | Ht 69.6 in | Wt 230.8 lb

## 2023-10-03 DIAGNOSIS — E785 Hyperlipidemia, unspecified: Secondary | ICD-10-CM

## 2023-10-03 DIAGNOSIS — I2510 Atherosclerotic heart disease of native coronary artery without angina pectoris: Secondary | ICD-10-CM

## 2023-10-03 MED ORDER — REPATHA SURECLICK 140 MG/ML ~~LOC~~ SOAJ: 140.0000 mg | SUBCUTANEOUS | 3 refills | Status: DC

## 2023-10-03 NOTE — Patient Instructions (Signed)
 Medication Instructions:  Continue current medications *If you need a refill on your cardiac medications before your next appointment, please call your pharmacy*   Lab Work: none If you have labs (blood work) drawn today and your tests are completely normal, you will receive your results only by: MyChart Message (if you have MyChart) OR A paper copy in the mail If you have any lab test that is abnormal or we need to change your treatment, we will call you to review the results.   Testing/Procedures: none   Follow-Up: At [facility], you and your health needs are our priority.  As part of our continuing mission to provide you with exceptional heart care, we have created designated Provider Care Teams.  These Care Teams include your primary Cardiologist (physician) and Advanced Practice Providers (APPs -  Physician Assistants and Nurse Practitioners) who all work together to provide you with the care you need, when you need it.  We recommend signing up for the patient portal called "MyChart".  Sign up information is provided on this After Visit Summary.  MyChart is used to connect with patients for Virtual Visits (Telemedicine).  Patients are able to view lab/test results, encounter notes, upcoming appointments, etc.  Non-urgent messages can be sent to your provider as well.   To learn more about what you can do with MyChart, go to [email or web address].    Your next appointment:   1 year(s)  Provider:   Dr. Bjorn Pippin   Other Instructions none

## 2023-11-04 LAB — HM DIABETES EYE EXAM

## 2023-11-06 ENCOUNTER — Encounter: Payer: Self-pay | Admitting: Family Medicine

## 2023-11-06 ENCOUNTER — Other Ambulatory Visit: Payer: Self-pay

## 2023-11-06 DIAGNOSIS — F321 Major depressive disorder, single episode, moderate: Secondary | ICD-10-CM

## 2023-11-06 MED ORDER — BUPROPION HCL ER (XL) 150 MG PO TB24
150.0000 mg | ORAL_TABLET | Freq: Every day | ORAL | 1 refills | Status: DC
Start: 2023-11-06 — End: 2024-03-09

## 2023-11-07 ENCOUNTER — Telehealth: Payer: Self-pay

## 2023-11-07 NOTE — Telephone Encounter (Signed)
 Fax received from [facility] regarding pt's most recent eye exam. Results placed in PCP office for review.

## 2023-11-21 ENCOUNTER — Ambulatory Visit (INDEPENDENT_AMBULATORY_CARE_PROVIDER_SITE_OTHER): Payer: 59 | Admitting: Internal Medicine

## 2023-11-21 ENCOUNTER — Encounter: Payer: Self-pay | Admitting: Internal Medicine

## 2023-11-21 VITALS — BP 120/80 | HR 82 | Ht 69.6 in | Wt 230.4 lb

## 2023-11-21 DIAGNOSIS — E785 Hyperlipidemia, unspecified: Secondary | ICD-10-CM | POA: Diagnosis not present

## 2023-11-21 DIAGNOSIS — E559 Vitamin D deficiency, unspecified: Secondary | ICD-10-CM

## 2023-11-21 DIAGNOSIS — E063 Autoimmune thyroiditis: Secondary | ICD-10-CM | POA: Diagnosis not present

## 2023-11-21 DIAGNOSIS — E1165 Type 2 diabetes mellitus with hyperglycemia: Secondary | ICD-10-CM

## 2023-11-21 LAB — POCT GLYCOSYLATED HEMOGLOBIN (HGB A1C): Hemoglobin A1C: 6.7 % — AB (ref 4.0–5.6)

## 2023-11-21 NOTE — Patient Instructions (Addendum)
 Please continue Synthroid 125 mcg daily.  Take the thyroid hormone every day, with water, at least 30 minutes before breakfast, separated by at least 4 hours from: - acid reflux medications - calcium - iron - multivitamins  Please also continue:  - Metformin but take 2000 mg with dinner - Vitamin 5000 units daily  Please come back for a follow-up appointment in 6 months.

## 2023-11-21 NOTE — Progress Notes (Signed)
 Patient ID: Allison Compton, female   DOB: 1966-08-13, 58 y.o.   MRN: [id number]  HPI  Allison Compton is a 58 y.o.-year-old female, returning for f/u for Hashimoto's hypothyroidism, DM2, and vitamin D deficiency. Last visit 4 mo ago.  Interim history: No increased urination, blurry vision, nausea, chest pain. She was let go from work in 2023, after 28 years >> stress eating and cravings.  She restarted Wellbutrin which was helping with cravings in the past.  She continues on this. She is doing intermittent fasting - eats first meal after 10 am.   Hashimoto's hypothyroidism: - dx'ed 04/2015.  She is on Synthroid d.a.w. 125 mcg daily: - in am - fasting - at least 30 min from b'fast - + calcium at bedtime - no iron - no multivitamins - no PPIs - not on Biotin  Reviewed her TFTs: Lab Results  Component Value Date   TSH 1.09 02/13/2023   TSH 1.40 07/25/2022   TSH 0.92 02/05/2022   TSH 1.66 07/26/2021   TSH 1.13 01/19/2021   TSH 1.53 06/03/2020   TSH 3.93 07/17/2019   TSH 2.14 06/13/2018   TSH 3.57 01/07/2018   TSH 0.90 05/17/2017   FREET4 1.16 07/25/2022   FREET4 1.13 07/26/2021   FREET4 1.26 01/19/2021   FREET4 1.03 06/03/2020   FREET4 0.99 07/17/2019   FREET4 1.04 06/13/2018   FREET4 0.99 01/07/2018   FREET4 1.5 05/17/2017   FREET4 1.28 11/20/2016   FREET4 1.29 07/27/2016    She has Hashimoto's thyroiditis: Component     Latest Ref Rng & Units 01/07/2018 06/13/2018  Thyroperoxidase Ab SerPl-aCnc     <9 IU/mL 96 (H) 139 (H)  Thyroglobulin Ab     < or = 1 IU/mL 1 <1   Component     Latest Ref Rng & Units 05/30/2015 05/28/2016 11/20/2016  Thyroperoxidase Ab SerPl-aCnc     <9 IU/mL 782 (H) 652 (H) 328 (H)  Thyroglobulin Ab     <2 IU/mL 2 (H) 1   Previously on low-dose selenium.  Pt denies: - feeling nodules in neck - hoarseness - dysphagia - choking  She has + FH of thyroid disorders in: several cousins. No FH of thyroid cancer. No h/o radiation tx to head or  neck. No herbal supplements. No Biotin use. No recent steroids use.   DM2: - dx'ed 10/2020  Reviewed HbA1c levels: Lab Results  Component Value Date   HGBA1C 7.3 (A) 07/22/2023   HGBA1C 7.1 (H) 02/13/2023   HGBA1C 6.5 07/25/2022   HGBA1C 6.4 (H) 02/05/2022   HGBA1C 6.3 (A) 06/14/2021   HGBA1C 6.3 06/14/2021   HGBA1C 6.3 06/14/2021   HGBA1C 6.3 06/14/2021   HGBA1C 6.5 (A) 01/19/2021   HGBA1C 6.7 (H) 10/13/2020   HGBA1C 6.8 (H) 06/03/2020   HGBA1C 6.4 07/17/2019   HGBA1C 6.0 (H) 09/26/2018   HGBA1C 6.3 06/13/2018   HGBA1C 6.4 01/07/2018   HGBA1C 5.8 (H) 05/17/2017   HGBA1C 6.2 11/20/2016   HGBA1C 6.0 05/28/2016   HGBA1C 6.3 12/19/2015   HGBA1C 6.0 09/30/2015   She was started on metformin 500 mg twice a day by PCP in 10/2020 >> 1000 mcg 2x a day (lunch and dinner)  -increased 07/2023  She checks sugars >4x a day with her Libre CGM:   No CKD: Lab Results  Component Value Date   BUN 15 02/13/2023   Lab Results  Component Value Date   CREATININE 0.81 02/13/2023   No MAU: Lab Results  Component  Value Date   MICRALBCREAT 3 07/22/2023   MICRALBCREAT 1.1 07/25/2022   MICRALBCREAT 0.9 06/14/2021   MICRALBCREAT 1.3 07/17/2019   MICRALBCREAT 21.5 09/29/2008   Last eye exam 11/04/2023: No DR. + low grade cataracts.  Last foot exam 12/31/2022.  + HL: Lab Results  Component Value Date   CHOL 141 02/13/2023   HDL 61.70 02/13/2023   LDLCALC 54 02/13/2023   LDLDIRECT 139.9 02/17/2008   TRIG 127.0 02/13/2023   CHOLHDL 2 02/13/2023  We tried to start Atorvastatin but she developed mm aches and Achilles tendinitis (had to have sx) and we stopped.  Currently on Repatha, off ezetimibe - per the Lipid clinic.  In the past, she was on a gluten and dairy free diet and her weight and TPO antibodies improved.  Afterwards, she tried a keto diet but TPO antibodies worsened.  She has a history of vitamin D deficiency: Lab Results  Component Value Date   VD25OH 36.14 02/13/2023    VD25OH 44.11 07/25/2022   VD25OH 39.13 07/26/2021   VD25OH 45.64 01/24/2021   VD25OH 30.06 10/13/2020   VD25OH 26.99 (L) 06/03/2020   VD25OH 27.13 (L) 07/17/2019   VD25OH 36 05/17/2017   VD25OH 48.22 11/20/2016   VD25OH 25.96 (L) 05/28/2016   She was on a vitamin D supplement: 5000 units daily.  However, she has not taken it in the last few weeks.  She plans to restart.  She also has a history of hysterectomy in 2015. She had THR in 2018/10/12. She had a calcium score of 0 in 08/2020. She had appendicitis 09/24/2020 and had to have laparoscopic appendectomy.  Her husband passed away in 12-Oct-2020 from metastatic prostate cancer.  ROS: + see HPI  I reviewed pt's medications, allergies, PMH, social hx, family hx, and changes were documented in the history of present illness. Otherwise, unchanged from my initial visit note.  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   DYSPLASIA, VAGINA 06/09/2007   Qualifier: History of  By: Rosilyn Mings RN, Kimberly     Eczema    Edema of both legs    Elevated blood pressure reading    Fibroids    Hashimoto's disease    Hyperlipidemia    Rectal hemorrhage 05/17/2017   Seasonal affective disorder (HCC)    PMH of   Past Surgical History:  Procedure Laterality Date   ANAL FISTULOTOMY  2019   APPENDECTOMY  10/12/20   CERVICAL BIOPSY  W/ LOOP ELECTRODE EXCISION  2002   LAPAROSCOPIC ASSISTED VAGINAL HYSTERECTOMY N/A 02/10/2014   Procedure: LAPAROSCOPIC ASSISTED VAGINAL HYSTERECTOMY;  Surgeon: Leslie Andrea, MD;  Location: [facility];  Service: Gynecology;  Laterality: N/A;   SALPINGOOPHORECTOMY Bilateral 02/10/2014   Procedure: SALPINGO OOPHORECTOMY;  Surgeon: Leslie Andrea, MD;  Location: [facility];  Service: Gynecology;  Laterality: Bilateral;   TOTAL HIP ARTHROPLASTY Right 10/07/2018   Procedure: TOTAL HIP ARTHROPLASTY ANTERIOR APPROACH;  Surgeon: Durene Romans, MD;  Location: [facility];  Service: Orthopedics;  Laterality: Right;  70 mins   Social History   Social History   Marital Status: Married    Spouse Name: N/A   Number of Children: 1   Occupational History   n/a   Social History Main Topics   Smoking status: Former Smoker    Quit date: 08/13/1985   Smokeless tobacco: Not on file  Comment: age [age], up to 1 ppd   Alcohol Use: Yes     Comment: rarely once a month or less   Drug Use: No   Current Outpatient Medications on File Prior to Visit  Medication Sig Dispense Refill   buPROPion (WELLBUTRIN XL) 150 MG 24 hr tablet Take 1 tablet (150 mg total) by mouth daily. 90 tablet 1   Cholecalciferol (VITAMIN D3) 125 MCG (5000 UT) TABS Take by mouth.     Continuous Glucose Sensor (FREESTYLE LIBRE 2 SENSOR) MISC 1 each by Does not apply route every 14 (fourteen) days. 6 each 3   Evolocumab (REPATHA SURECLICK) 140 MG/ML SOAJ Inject 140 mg into the skin every 14 (fourteen) days. 6 mL 3   meloxicam (MOBIC) 7.5 MG tablet Take 7.5 mg by mouth as needed.     metFORMIN (GLUCOPHAGE) 1000 MG tablet Take 1 tablet (1,000 mg total) by mouth 2 (two) times daily with a meal. 180 tablet 3   SYNTHROID 125 MCG tablet TAKE 1 TABLET (125 MCG TOTAL) DAILY BEFORE BREAKFAST 90 tablet 3   No current facility-administered medications on file prior to visit.   Allergies  Allergen Reactions   Atorvastatin     myalgia   Rosuvastatin     myalgias   Statins Other (See Comments)    Myalgia, Achilles tendonitis   Adhesive [Tape] Rash    Sometimes peels skin off   Latex Rash    Sometimes peels skin off   Nickel Rash   Family History  Problem Relation Age of Onset   Diabetes Mother    Asthma Mother    Seasonal affective disorder Mother    Clotting disorder Mother        excess platelets   COPD Mother    Cancer Mother    Hyperlipidemia Mother    Stroke Mother    Kidney cancer Father    Hypertension Father    Cancer Father        kidney    Heart disease Father    Hyperlipidemia Maternal Grandmother    Stroke Maternal Grandmother 50       mini strokes   Pancreatic cancer Paternal Grandmother    Heart failure Paternal Grandmother    Heart failure Maternal Grandfather        MI @ 61   Alzheimer's disease Paternal Grandfather    Seasonal affective disorder Sister    GI problems Brother        GI bleed   Heart disease Paternal Uncle    PE: BP 120/80   Pulse 82   Ht 5' 9.6" (1.768 m)   Wt 230 lb 6.4 oz (104.5 kg)   LMP 02/06/2012   SpO2 97%   BMI 33.44 kg/m  Wt Readings from Last 3 Encounters:  11/21/23 230 lb 6.4 oz (104.5 kg)  10/03/23 230 lb 12.8 oz (104.7 kg)  07/22/23 238 lb 3.2 oz (108 kg)   Constitutional: overweight, in NAD Eyes:  EOMI, no exophthalmos ENT: no neck masses, no cervical lymphadenopathy Cardiovascular: RRR, No MRG Respiratory: CTA B Musculoskeletal: no deformities Skin:no rashes Neurological: no tremor with outstretched hands  ASSESSMENT: 1. Hypothyroidism - Thyroid U/S (06/13/2015): Thyroid tissue is mildly heterogeneous but no focal nodules.  2. DM2  3.  Hyperlipidemia  4. Vit D def.  PLAN:  1. Patient with Hashimoto's hypothyroidism, on name Synthroid (from [facility] in [place]) - latest thyroid labs reviewed with pt. >> normal: Lab Results  Component Value Date   TSH  1.09 02/13/2023  - she continues on LT4 125 mcg daily - pt feels good on this dose. - we discussed about taking the thyroid hormone every day, with water, >30 minutes before breakfast, separated by >4 hours from acid reflux medications, calcium, iron, multivitamins. Pt. is taking it correctly. - will check thyroid tests today: TSH and fT4 - If labs are abnormal, she will need to return for repeat TFTs in 1.5 months  2.  Type 2 diabetes - She saw the nutritionist through her work and previously worked with a Veterinary surgeon to help control her eating.  She was able to eliminate sweets and we also discussed about  reducing fatty foods. - In 10/2020, PCP started her on metformin 500 mg twice a day.  She tolerated this well. -At last visit, however, HbA1c was higher, at 7.3%.  I advised her to increase metformin to 1000 mg twice a day.  She continues on this dose now. -She lost 8 pounds since last visit.  Since then, she lost another 8 pounds. -Unfortunately, the freestyle libre CGM was not covered for her due to not being on insulin and having hypoglycemia.  However, she was able to obtain it.  She states she is paying for it out-of-pocket. CGM interpretation: -At today's visit, we reviewed her CGM downloads: It appears that 91% of values are in target range (goal >70%), while 9% are higher than 180 (goal <25%), and 0% are lower than 70 (goal <4%).  The calculated average blood sugar is 135.  The projected HbA1c for the next 3 months (GMI) is 6.5%. -Reviewing the CGM trends, sugars appear to be at goal during the night but they are increasing after approximately 6 AM, without her eating.  They are more fluctuating later in the day but mostly within the target range.  We discussed about possible reasons for the sugars being high at waking up including dawn phenomenon and also increased hepatic gluconeogenesis.  We discussed about possibly moving the metformin to dinnertime to help with the latter.  If she develops GI symptoms, we can switch to metformin ER.  She will let me know. -At today's visit, HbA1c is 6.7% (lower) - I will see her back in 6 months  3.  Hyperlipidemia - Latest lipid panel showed all fractions at goal: Lab Results  Component Value Date   CHOL 141 02/13/2023   HDL 61.70 02/13/2023   LDLCALC 54 02/13/2023   LDLDIRECT 139.9 02/17/2008   TRIG 127.0 02/13/2023   CHOLHDL 2 02/13/2023  -We tried to use a statin but she developed myalgias and Achilles tendinitis-she had to have physical therapy for this the pain continued for several months.  This resolved after stopping the statin - She is  current seen in the lipid clinic-on Repatha - She will have an annual physical exam with PCP during the summer and will have her lipid checked at that time  4. Vit D def. - Latest vitamin D level was normal: Lab Results  Component Value Date   VD25OH 36.14 02/13/2023  - She continues on 5000 units vitamin D daily but she has been off for the last few weeks.  I advised her to restart it. - she will have another vitamin D level checked during the summer, at her [facility]  Carlus Pavlov, MD PhD [facility]

## 2024-01-17 ENCOUNTER — Encounter: Payer: Self-pay | Admitting: Family Medicine

## 2024-01-17 ENCOUNTER — Ambulatory Visit (INDEPENDENT_AMBULATORY_CARE_PROVIDER_SITE_OTHER): Admitting: Family Medicine

## 2024-01-17 VITALS — BP 120/80 | HR 86 | Temp 97.8°F | Wt 234.0 lb

## 2024-01-17 DIAGNOSIS — W57XXXA Bitten or stung by nonvenomous insect and other nonvenomous arthropods, initial encounter: Secondary | ICD-10-CM

## 2024-01-17 DIAGNOSIS — S80862A Insect bite (nonvenomous), left lower leg, initial encounter: Secondary | ICD-10-CM | POA: Diagnosis not present

## 2024-01-17 MED ORDER — DOXYCYCLINE HYCLATE 100 MG PO TABS: 100.0000 mg | ORAL_TABLET | Freq: Two times a day (BID) | ORAL | 0 refills | Status: DC

## 2024-01-17 NOTE — Progress Notes (Signed)
 Allison Compton , 11-08-1965, 58 y.o., female MRN: [id number] Patient Care Team    Relationship Specialty Notifications Start End  Mariel Shope, DO PCP - General Family Medicine  12/22/15   Emilie Harden, MD Consulting Physician Internal Medicine  12/23/15    Comment: prediabetes/thyroid   Claiborne Crew, MD Consulting Physician Orthopedic Surgery  02/05/22   Amada Backer, MD Consulting Physician Orthopedic Surgery  02/05/22   Wendie Hamburg, MD Consulting Physician Cardiology  02/05/22   Vanetta Generous, MD Referring Physician Obstetrics and Gynecology  02/05/22   Elois Hair, MD Consulting Physician Gastroenterology  02/13/23     Chief Complaint  Patient presents with   Insect Bite    Pt was bitten by a tick 2 weeks ago; removed tick from L leg. Redness, irritation. Pt requesting to be tested for Lyme disease.       Subjective: Allison Compton is a 58 y.o. Pt presents for an OV with complaints of tick bite that occurred 2 weeks ago on her posterior calf (left).  Associated symptoms include redness and swelling around site. Patient reports the tick was embedded but she was able to successfully remove.  She reports the redness has resolved now. She is uncertain how long the tick was latched, given the location posterior calf, is not an area she commonly sees.  She has been working out in her garden for many hours daily for the last couple weeks. Pt denies headache, fatigue, rash ,headache, fever. She does endorse mild knee discomfort but no redness or swelling. She denies rash. She is concerned for lyme disease and would like tested today.  Patient reports she was positive for Lyme greater than 20 years ago and treated.  Would like to be tested for Lyme today.     12/31/2022    8:20 AM 02/05/2022    3:39 PM 06/14/2021    3:48 PM 06/16/2020    8:17 AM 09/02/2019    8:58 AM  Depression screen PHQ 2/9  Decreased Interest 0 0 0 3 0  Down, Depressed, Hopeless 0 0 0 3 0   PHQ - 2 Score 0 0 0 6 0  Altered sleeping  0 1 2   Tired, decreased energy  0 1 3   Change in appetite  2 2 3    Feeling bad or failure about yourself   0 0 3   Trouble concentrating  0 0 0   Moving slowly or fidgety/restless  0 0 0   Suicidal thoughts  0 0 0   PHQ-9 Score  2 4 17      Allergies  Allergen Reactions   Atorvastatin      myalgia   Rosuvastatin      myalgias   Statins Other (See Comments)    Myalgia, Achilles tendonitis   Adhesive [Tape] Rash    Sometimes peels skin off   Latex Rash    Sometimes peels skin off   Nickel Rash   Social History   Social History Narrative   Married, "[place]" [place]). 1 adult son Fabian Holster).    BA degree, Transport planner.    Drinks caffeine occasionally, uses herbal remedies.   Wears her seatbelt, smoke detector in the home.   Firearms in the home, locked cabinet.   Feels safe in her relationships.         Epworth Sleepiness Scale = 6 (as of 05/09/2016)   Past Medical History:  Diagnosis Date   Acute appendicitis 09/24/2020  Anal tear    Borderline abnormal TFTs    due to soy nut ingestion   DYSPLASIA, VAGINA 06/09/2007   Qualifier: History of  By: Marisela Sicks RN, Kimberly     Eczema    Edema of both legs    Elevated blood pressure reading    Fibroids    Hashimoto's disease    Hyperlipidemia    Rectal hemorrhage 05/17/2017   Seasonal affective disorder [facility])    PMH of   Past Surgical History:  Procedure Laterality Date   ANAL FISTULOTOMY  2019   APPENDECTOMY  09/2020   CERVICAL BIOPSY  W/ LOOP ELECTRODE EXCISION  2002   LAPAROSCOPIC ASSISTED VAGINAL HYSTERECTOMY N/A 02/10/2014   Procedure: LAPAROSCOPIC ASSISTED VAGINAL HYSTERECTOMY;  Surgeon: Jeanmarie Millet, MD;  Location: [facility];  Service: Gynecology;  Laterality: N/A;   SALPINGOOPHORECTOMY Bilateral 02/10/2014   Procedure: SALPINGO OOPHORECTOMY;  Surgeon: Jeanmarie Millet, MD;  Location: [facility];  Service: Gynecology;  Laterality: Bilateral;   TOTAL HIP ARTHROPLASTY Right  10/07/2018   Procedure: TOTAL HIP ARTHROPLASTY ANTERIOR APPROACH;  Surgeon: Claiborne Crew, MD;  Location: [facility];  Service: Orthopedics;  Laterality: Right;  70 mins   Family History  Problem Relation Age of Onset   Diabetes Mother    Asthma Mother    Seasonal affective disorder Mother    Clotting disorder Mother        excess platelets   COPD Mother    Cancer Mother    Hyperlipidemia Mother    Stroke Mother    Kidney cancer Father    Hypertension Father    Cancer Father        kidney   Heart disease Father    Hyperlipidemia Maternal Grandmother    Stroke Maternal Grandmother 41       mini strokes   Pancreatic cancer Paternal Grandmother    Heart failure Paternal Grandmother    Heart failure Maternal Grandfather        MI @ 63   Alzheimer's disease Paternal Grandfather    Seasonal affective disorder Sister    GI problems Brother        GI bleed   Heart disease Paternal Uncle    Allergies as of 01/17/2024       Reactions   Atorvastatin     myalgia   Rosuvastatin     myalgias   Statins Other (See Comments)   Myalgia, Achilles tendonitis   Adhesive [tape] Rash   Sometimes peels skin off   Latex Rash   Sometimes peels skin off   Nickel Rash        Medication List        Accurate as of January 17, 2024  3:12 PM. If you have any questions, ask your nurse or doctor.          buPROPion  150 MG 24 hr tablet Commonly known as: WELLBUTRIN  XL Take 1 tablet (150 mg total) by mouth daily.   doxycycline 100 MG tablet Commonly known as: VIBRA-TABS Take 1 tablet (100 mg total) by mouth 2 (two) times daily. Started by: Napolean Backbone   FreeStyle Libre 2 Sensor Misc 1 each by Does not apply route every 14 (fourteen) days.   meloxicam 7.5 MG tablet Commonly known as: MOBIC Take 7.5 mg by mouth as needed.   metFORMIN  1000 MG tablet Commonly known as: GLUCOPHAGE  Take 1 tablet (1,000 mg total) by mouth 2 (two) times daily with a meal.   Repatha  SureClick 140 MG/ML  Soaj  Generic drug: Evolocumab  Inject 140 mg into the skin every 14 (fourteen) days.   Synthroid  125 MCG tablet Generic drug: levothyroxine  TAKE 1 TABLET (125 MCG TOTAL) DAILY BEFORE BREAKFAST   Vitamin D3 125 MCG (5000 UT) Tabs Take by mouth.        All past medical history, surgical history, allergies, family history, immunizations andmedications were updated in the EMR today and reviewed under the history and medication portions of their EMR.     ROS Negative, with the exception of above mentioned in HPI   Objective:  BP 120/80   Pulse 86   Temp 97.8 F (36.6 C)   Wt 234 lb (106.1 kg)   LMP 02/06/2012   SpO2 98%   BMI 33.96 kg/m  Body mass index is 33.96 kg/m.  Physical Exam Vitals and nursing note reviewed.  Constitutional:      General: She is not in acute distress.    Appearance: Normal appearance. She is normal weight. She is not ill-appearing or toxic-appearing.  HENT:     Head: Normocephalic and atraumatic.  Eyes:     General: No scleral icterus.       Right eye: No discharge.        Left eye: No discharge.     Extraocular Movements: Extraocular movements intact.     Conjunctiva/sclera: Conjunctivae normal.     Pupils: Pupils are equal, round, and reactive to light.  Skin:    Findings: No rash.     Comments: Left calf: X 2 insect bite left calf.  No redness, no swelling.  No rashes present.  Neurological:     Mental Status: She is alert and oriented to person, place, and time. Mental status is at baseline.     Motor: No weakness.     Coordination: Coordination normal.     Gait: Gait normal.  Psychiatric:        Mood and Affect: Mood normal.        Behavior: Behavior normal.        Thought Content: Thought content normal.        Judgment: Judgment normal.      No results found. No results found. No results found for this or any previous visit (from the past 24 hours).  Assessment/Plan: Allison Compton is a 58 y.o. female present for OV for   Tick bite of left lower leg, initial encounter (Primary) -Doxy twice daily x 10 days prescribed - B. burgdorfi antibodies We discussed Lyme disease.  She had been treated a few decades ago for Lyme disease. She is asymptomatic at this time but has obvious concerns concerning her history all like retested. Reviewed expectations re: course of current medical issues. Discussed self-management of symptoms. Outlined signs and symptoms indicating need for more acute intervention. Patient verbalized understanding and all questions were answered. Patient received an After-Visit Summary.    Orders Placed This Encounter  Procedures   B. burgdorfi antibodies   Meds ordered this encounter  Medications   doxycycline (VIBRA-TABS) 100 MG tablet    Sig: Take 1 tablet (100 mg total) by mouth 2 (two) times daily.    Dispense:  20 tablet    Refill:  0   Referral Orders  No referral(s) requested today     Note is dictated utilizing voice recognition software. Although note has been proof read prior to signing, occasional typographical errors still can be missed. If any questions arise, please do not hesitate to call for verification.  electronically signed by:  Napolean Backbone, DO  [facility] - [place]

## 2024-01-17 NOTE — Patient Instructions (Addendum)

## 2024-01-20 ENCOUNTER — Telehealth: Payer: Self-pay

## 2024-01-20 NOTE — Telephone Encounter (Signed)
 Communication  Reason for CRM: Patient received text regarding recent lab results. Will receive call back.   Spoke with pt. Advised pt that we did not yet have the lab results from 6/6. Pt understood and verbalized understanding.

## 2024-01-21 LAB — B. BURGDORFI ANTIBODIES: B burgdorferi Ab IgG+IgM: <0.9 {index}

## 2024-01-22 ENCOUNTER — Ambulatory Visit: Payer: Self-pay | Admitting: Family Medicine

## 2024-02-18 ENCOUNTER — Encounter: Payer: 59 | Admitting: Family Medicine

## 2024-03-09 ENCOUNTER — Encounter: Payer: Self-pay | Admitting: Family Medicine

## 2024-03-09 ENCOUNTER — Ambulatory Visit (INDEPENDENT_AMBULATORY_CARE_PROVIDER_SITE_OTHER): Admitting: Family Medicine

## 2024-03-09 VITALS — BP 118/80 | HR 71 | Temp 97.9°F | Ht 69.0 in | Wt 233.6 lb

## 2024-03-09 DIAGNOSIS — Z23 Encounter for immunization: Secondary | ICD-10-CM

## 2024-03-09 DIAGNOSIS — E559 Vitamin D deficiency, unspecified: Secondary | ICD-10-CM | POA: Diagnosis not present

## 2024-03-09 DIAGNOSIS — Z7984 Long term (current) use of oral hypoglycemic drugs: Secondary | ICD-10-CM

## 2024-03-09 DIAGNOSIS — Z0000 Encounter for general adult medical examination without abnormal findings: Secondary | ICD-10-CM | POA: Diagnosis not present

## 2024-03-09 DIAGNOSIS — E063 Autoimmune thyroiditis: Secondary | ICD-10-CM | POA: Diagnosis not present

## 2024-03-09 DIAGNOSIS — E1165 Type 2 diabetes mellitus with hyperglycemia: Secondary | ICD-10-CM | POA: Diagnosis not present

## 2024-03-09 DIAGNOSIS — N760 Acute vaginitis: Secondary | ICD-10-CM

## 2024-03-09 DIAGNOSIS — F321 Major depressive disorder, single episode, moderate: Secondary | ICD-10-CM

## 2024-03-09 DIAGNOSIS — Z1231 Encounter for screening mammogram for malignant neoplasm of breast: Secondary | ICD-10-CM | POA: Diagnosis not present

## 2024-03-09 DIAGNOSIS — Z8249 Family history of ischemic heart disease and other diseases of the circulatory system: Secondary | ICD-10-CM | POA: Diagnosis not present

## 2024-03-09 LAB — COMPREHENSIVE METABOLIC PANEL WITH GFR
ALT: 20 U/L (ref 0–35)
AST: 13 U/L (ref 0–37)
Albumin: 4.4 g/dL (ref 3.5–5.2)
Alkaline Phosphatase: 78 U/L (ref 39–117)
BUN: 18 mg/dL (ref 6–23)
CO2: 29 meq/L (ref 19–32)
Calcium: 9.3 mg/dL (ref 8.4–10.5)
Chloride: 100 meq/L (ref 96–112)
Creatinine, Ser: 0.89 mg/dL (ref 0.40–1.20)
GFR: 70.41 mL/min (ref >60.00)
Glucose, Bld: 163 mg/dL — ABNORMAL HIGH (ref 70–99)
Potassium: 4.3 meq/L (ref 3.5–5.1)
Sodium: 138 meq/L (ref 135–145)
Total Bilirubin: 0.5 mg/dL (ref 0.2–1.2)
Total Protein: 6.8 g/dL (ref 6.0–8.3)

## 2024-03-09 LAB — LIPID PANEL
Cholesterol: 152 mg/dL (ref 0–200)
HDL: 60.9 mg/dL (ref >39.00)
LDL Cholesterol: 60 mg/dL (ref 0–99)
NonHDL: 91.16
Total CHOL/HDL Ratio: 2
Triglycerides: 155 mg/dL — ABNORMAL HIGH (ref 0.0–149.0)
VLDL: 31 mg/dL (ref 0.0–40.0)

## 2024-03-09 LAB — CBC
HCT: 38.3 % (ref 36.0–46.0)
Hemoglobin: 12.6 g/dL (ref 12.0–15.0)
MCHC: 32.8 g/dL (ref 30.0–36.0)
MCV: 85.3 fl (ref 78.0–100.0)
Platelets: 240 K/uL (ref 150.0–400.0)
RBC: 4.5 Mil/uL (ref 3.87–5.11)
RDW: 14.1 % (ref 11.5–15.5)
WBC: 7 K/uL (ref 4.0–10.5)

## 2024-03-09 LAB — MICROALBUMIN / CREATININE URINE RATIO
Creatinine,U: 209.1 mg/dL
Microalb Creat Ratio: 3.5 mg/g (ref 0.0–30.0)
Microalb, Ur: 0.7 mg/dL (ref 0.0–1.9)

## 2024-03-09 MED ORDER — BUPROPION HCL ER (XL) 150 MG PO TB24: 150.0000 mg | ORAL_TABLET | Freq: Every day | ORAL | 1 refills | Status: DC

## 2024-03-09 NOTE — Progress Notes (Signed)
 Patient ID: Allison Compton, female  DOB: Jul 05, 1966, 58 y.o.   MRN: [id number] Patient Care Team    Relationship Specialty Notifications Start End  Catherine Charlies DELENA, DO PCP - General Family Medicine  12/22/15   Trixie File, MD Consulting Physician Internal Medicine  12/23/15    Comment: prediabetes/thyroid   Ernie Cough, MD Consulting Physician Orthopedic Surgery  02/05/22   Kit Rush, MD Consulting Physician Orthopedic Surgery  02/05/22   Kate Lonni CROME, MD Consulting Physician Cardiology  02/05/22   Sharman Doffing, MD Referring Physician Obstetrics and Gynecology  02/05/22   Stacia Glendia BRAVO, MD Consulting Physician Gastroenterology  02/13/23     Chief Complaint  Patient presents with   Annual Exam    Chronic Conditions/illness Management. Pt is fasting.       Subjective: Allison Compton is a 58 y.o.  Female  present for CPE and chronic conditions  All past medical history, surgical history, allergies, family history, immunizations, medications and social history were updated in the electronic medical record today. All recent labs, ED visits and hospitalizations within the last year were reviewed.  Health maintenance:  Colonoscopy:2015- GAP> 10 yr- referred to and now est with  Dr. Stacia scheduled for (2025) Mammogram: 05/2022- ordered [facility] Cervical cancer screening:08/23/2020 [facility]- wnl-neg HPV- 5 yr Immunizations: tdap utd 02/2023 , influenza encouraged yearly,completed, shingrix  series completed , Hep a/b series completed.,PNA20 completed today Infectious disease screening: hiv and hep c completed Dexa: scheduled Oxygen [id number] Patient has a Dental home. Hospitalizations/ED visits: reviewed  Depression, major, single episode, moderate (HCC)/Hot flashes Patient reports compliance with Wellbutrin  150 mg daily.  She feels her blood pressures and mood are stable on current regimen.   Mixed hyperlipidemia/Family history of heart disease Patient  reports she is compliant with repatha  by cardio.    Diabetes and hypothyroidism due to Hashimoto's thyroiditis Managed by endocrine.  Compliant with Synthroid   Stable   Vitamin D  deficiency Reports she has  been consistent with taking her vitamin D  supplement.   Cardiac CT 08/2020: IMPRESSION: 1. Coronary calcium  score of 0. 2. Normal coronary origin with right dominance. 3. 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 Xray with degeneration at L5-S1. Pt declined gabapentin  use. Prior note:  Pt presents for an OV with complaints of b/l toe numbness  of 1 year duration.  Associated symptoms include tingling/stabbing sensation that is worse over the second and third toe bilaterally, but present overall toes.  She also feels this sensation is mostly on the bottom of the feet/toes.  No temperature change or color change.  Can be worse at times, although no particular position or activity causes increase in symptoms. She has never had to have back surgery, but she did have a back injury in her early 27s in which she received chiropractic care for some time.  She also reports she fell when she was pregnant and landed on her sacrum breaking her coccyx at the time. She denies any back pain currently.  12/31/2022 FINDINGS: Five lumbar-type vertebral bodies. Preserved vertebral body heights. No listhesis. Moderate disc height loss at L5-S1. Mild scattered endplate osteophytes. No listhesis. Preserved bone mineralization. Prominent lower lumbar facet degenerative changes. Additional workup for stenosis as clinically directed with CT or MRI. IMPRESSION: Scattered degenerative changes greatest at L5-S1.     03/09/2024    7:57 AM 12/31/2022    8:20 AM 02/05/2022    3:39 PM  06/14/2021    3:48 PM 06/16/2020    8:17 AM  Depression screen PHQ 2/9  Decreased Interest  0 0 0 3  Down, Depressed, Hopeless 0 0 0 0 3   PHQ - 2 Score 0 0 0 0 6  Altered sleeping 0  0 1 2  Tired, decreased energy 0  0 1 3  Change in appetite 2  2 2 3   Feeling bad or failure about yourself  0  0 0 3  Trouble concentrating 0  0 0 0  Moving slowly or fidgety/restless 0  0 0 0  Suicidal thoughts 0  0 0 0  PHQ-9 Score 2  2 4 17   Difficult doing work/chores Not difficult at all          03/09/2024    7:58 AM 02/05/2022    3:39 PM 06/14/2021    3:48 PM 06/16/2020    8:19 AM  GAD 7 : Generalized Anxiety Score  Nervous, Anxious, on Edge 0 0 0 0  Control/stop worrying 0 0 0 0  Worry too much - different things 0 0 0 0  Trouble relaxing 0 0 1 2  Restless 0 0 0 0  Easily annoyed or irritable 0 0 1 0  Afraid - awful might happen 0 0 0 0  Total GAD 7 Score 0 0 2 2  Anxiety Difficulty Not difficult at all       Immunization History  Administered Date(s) Administered   Hep A / Hep B 02/13/2023, 03/18/2023, 08/23/2023   Influenza Whole 06/10/2007   Influenza,inj,Quad PF,6+ Mos 07/17/2019   Moderna Sars-Covid-2 Vaccination 10/27/2019, 11/13/2019, 06/11/2020, 01/28/2021   PNEUMOCOCCAL CONJUGATE-20 03/09/2024   Pneumococcal Polysaccharide-23 02/11/2014   Td 08/13/1996   Tdap 02/13/2023   Zoster Recombinant(Shingrix ) 02/05/2022, 06/01/2022   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   Rectal hemorrhage 05/17/2017   Seasonal affective disorder (HCC)    PMH of   Allergies  Allergen Reactions   Atorvastatin      myalgia   Rosuvastatin      myalgias   Statins Other (See Comments)    Myalgia, Achilles tendonitis   Adhesive [Tape] Rash    Sometimes peels skin off   Latex Rash    Sometimes  peels skin off   Nickel Rash   Past Surgical History:  Procedure Laterality Date   ANAL FISTULOTOMY  2019   APPENDECTOMY  09/2020   CERVICAL BIOPSY  W/ LOOP ELECTRODE EXCISION  2002   LAPAROSCOPIC ASSISTED VAGINAL HYSTERECTOMY N/A 02/10/2014   Procedure: LAPAROSCOPIC ASSISTED VAGINAL HYSTERECTOMY;  Surgeon: Lynwood FORBES Curlene PONCE, MD;  Location: [facility];  Service: Gynecology;  Laterality: N/A;   SALPINGOOPHORECTOMY Bilateral 02/10/2014   Procedure: SALPINGO OOPHORECTOMY;  Surgeon: Lynwood FORBES Curlene PONCE, MD;  Location: [facility];  Service: Gynecology;  Laterality: Bilateral;   TOTAL HIP ARTHROPLASTY Right 10/07/2018   Procedure: TOTAL HIP ARTHROPLASTY ANTERIOR APPROACH;  Surgeon: Ernie Cough, MD;  Location: [facility];  Service: Orthopedics;  Laterality: Right;  70 mins   Family History  Problem Relation Age  of Onset   Diabetes Mother    Asthma Mother    Seasonal affective disorder Mother    Clotting disorder Mother        excess platelets   COPD Mother    Cancer Mother    Hyperlipidemia Mother    Stroke Mother    Kidney cancer Father    Hypertension Father    Cancer Father        kidney   Heart disease Father    Hyperlipidemia Maternal Grandmother    Stroke Maternal Grandmother 68       mini strokes   Pancreatic cancer Paternal Grandmother    Heart failure Paternal Grandmother    Heart failure Maternal Grandfather        MI @ 6   Alzheimer's disease Paternal Grandfather    Seasonal affective disorder Sister    GI problems Brother        GI bleed   Heart disease Paternal Uncle    Social History   Social History Narrative   Married, Designer, 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    BA degree, Transport planner.    Drinks caffeine occasionally, uses herbal remedies.   Wears her seatbelt, smoke detector in the home.   Firearms in the home, locked cabinet.   Feels safe in her relationships.         Epworth Sleepiness Scale = 6 (as of 05/09/2016)    Allergies as of 03/09/2024       Reactions    Atorvastatin     myalgia   Rosuvastatin     myalgias   Statins Other (See Comments)   Myalgia, Achilles tendonitis   Adhesive [tape] Rash   Sometimes peels skin off   Latex Rash   Sometimes peels skin off   Nickel Rash        Medication List        Accurate as of March 09, 2024  8:49 AM. If you have any questions, ask your nurse or doctor.          STOP taking these medications    doxycycline  100 MG tablet Commonly known as: VIBRA -TABS Stopped by: Charlies Bellini   meloxicam 7.5 MG tablet Commonly known as: MOBIC Stopped by: Charlies Bellini       TAKE these medications    buPROPion  150 MG 24 hr tablet Commonly known as: WELLBUTRIN  XL Take 1 tablet (150 mg total) by mouth daily.   FreeStyle Libre 2 Sensor Misc 1 each by Does not apply route every 14 (fourteen) days.   metFORMIN  1000 MG tablet Commonly known as: GLUCOPHAGE  Take 1 tablet (1,000 mg total) by mouth 2 (two) times daily with a meal.   Repatha  SureClick 140 MG/ML Soaj Generic drug: Evolocumab  Inject 140 mg into the skin every 14 (fourteen) days.   Synthroid  125 MCG tablet Generic drug: levothyroxine  TAKE 1 TABLET (125 MCG TOTAL) DAILY BEFORE BREAKFAST   Vitamin D3 125 MCG (5000 UT) Tabs Take by mouth.        All past medical history, surgical history, allergies, family history, immunizations andmedications were updated in the EMR today and reviewed under the history and medication portions of their EMR.       CT CORONARY MORPH W/CTA COR W/SCORE W/CA W/CM &/OR WO/CM Addendum Date: 08/26/2020   IMPRESSION: 1. Coronary calcium  score of 0. 2. Normal coronary origin with right dominance. 3. 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 CAD-RADS 2. Mild non-obstructive CAD (25-49%). Consider  non-atherosclerotic causes of chest pain. Consider preventive therapy and risk factor modification.  Result Date: 08/26/2020  IMPRESSION: No significant  incidental noncardiac findings are noted.    ROS 14 pt review of systems performed and negative (unless mentioned in an HPI)  Objective: BP 118/80   Pulse 71   Temp 97.9 F (36.6 C)   Ht 5' 9 (1.753 m)   Wt 233 lb 9.6 oz (106 kg)   LMP 02/06/2012   SpO2 96%   BMI 34.50 kg/m  Physical Exam Vitals and nursing note reviewed.  Constitutional:      General: She is not in acute distress.    Appearance: Normal appearance. She is obese. She is not ill-appearing or toxic-appearing.  HENT:     Head: Normocephalic and atraumatic.     Right Ear: Tympanic membrane, ear canal and external ear normal. There is no impacted cerumen.     Left Ear: Tympanic membrane, ear canal and external ear normal. There is no impacted cerumen.     Nose: No congestion or rhinorrhea.     Mouth/Throat:     Mouth: Mucous membranes are moist.     Pharynx: Oropharynx is clear. No oropharyngeal exudate or posterior oropharyngeal erythema.  Eyes:     General: No scleral icterus.       Right eye: No discharge.        Left eye: No discharge.     Extraocular Movements: Extraocular movements intact.     Conjunctiva/sclera: Conjunctivae normal.     Pupils: Pupils are equal, round, and reactive to light.  Cardiovascular:     Rate and Rhythm: Normal rate and regular rhythm.     Pulses: Normal pulses.     Heart sounds: Normal heart sounds. No murmur heard.    No friction rub. No gallop.  Pulmonary:     Effort: Pulmonary effort is normal. No respiratory distress.     Breath sounds: Normal breath sounds. No stridor. No wheezing, rhonchi or rales.  Chest:     Chest wall: No tenderness.  Abdominal:     General: Abdomen is flat. Bowel sounds are normal. There is no distension.     Palpations: Abdomen is soft. There is no mass.     Tenderness: There is no abdominal tenderness. There is no right CVA tenderness, left CVA tenderness, guarding or rebound.     Hernia: No hernia is present.  Musculoskeletal:         General: No swelling, tenderness or deformity. Normal range of motion.     Cervical back: Normal range of motion and neck supple. No rigidity or tenderness.     Right lower leg: No edema.     Left lower leg: No edema.  Lymphadenopathy:     Cervical: No cervical adenopathy.  Skin:    General: Skin is warm and dry.     Coloration: Skin is not jaundiced or pale.     Findings: No bruising, erythema, lesion or rash.  Neurological:     General: No focal deficit present.     Mental Status: She is alert and oriented to person, place, and time. Mental status is at baseline.     Cranial Nerves: No cranial nerve deficit.     Sensory: No sensory deficit.     Motor: No weakness.     Coordination: Coordination normal.     Gait: Gait normal.     Deep Tendon Reflexes: Reflexes normal.  Psychiatric:        Mood  and Affect: Mood normal.        Behavior: Behavior normal.        Thought Content: Thought content normal.        Judgment: Judgment normal.     No results found.  Assessment/plan: Allison Compton is a 58 y.o. female present for CPE and Chronic Conditions/illness Management Depression, major, single episode, moderate (HCC)/Hot flashes Stable Continue Wellbutrin  150 mg daily (and appetite suppression)  Mixed hyperlipidemia/Family history of heart disease Continue Repatha  bradycardia Lipids collected today  Hypothyroidism due to Hashimoto's thyroiditis Managed by endocrine.  TSH collected today   Diabetes w/ HLD: Managed by endocrine - microalb.  Collected 03/09/2024 PNA series:completed today 03/09/2024 Flu shot: (recommneded yearly) Foot exam: 11/21/2023 Eye exam: Completed 11/04/2023  Routine general medical examination at a health care facility Colonoscopy:2015- GAP> 10 yr- referred to and now est with [place] Dr. Stacia scheduled for (2025) Mammogram: 05/2022- ordered [facility] Cervical cancer screening:08/23/2020 [facility]- wnl-neg HPV- 5 yr Immunizations: tdap utd 02/2023 ,  influenza encouraged yearly,completed, shingrix  series completed , Hep a/b series completed.,PNA20 completed today Infectious disease screening: hiv and hep c completed Dexa: scheduled Patient was encouraged to exercise greater than 150 minutes a week. Patient was encouraged to choose a diet filled with fresh fruits and vegetables, and lean meats. AVS provided to patient today for education/recommendation on gender specific health and safety maintenance.   Return in about 24 weeks (around 08/24/2024) for Routine chronic condition follow-up.  Orders Placed This Encounter  Procedures   MM 3D SCREENING MAMMOGRAM BILATERAL BREAST   DG Bone Density   Pneumococcal conjugate vaccine 20-valent   CBC   Comprehensive metabolic panel with GFR   Lipid panel   TSH   Urine Microalbumin w/creat. ratio   Vitamin D  (25 hydroxy)   Magnesium    HM PAP SMEAR   Meds ordered this encounter  Medications   buPROPion  (WELLBUTRIN  XL) 150 MG 24 hr tablet    Sig: Take 1 tablet (150 mg total) by mouth daily.    Dispense:  90 tablet    Refill:  1   Referral Orders  No referral(s) requested today     Electronically signed by: Charlies Bellini, DO [facility] [place]

## 2024-03-09 NOTE — Patient Instructions (Addendum)
 Return in about 24 weeks (around 08/24/2024) for Routine chronic condition follow-up.        Great to see you today.  I have refilled the medication(s) we provide.   If labs were collected or images ordered, we will inform you of  results once we have received them and reviewed. We will contact you either by echart message, or telephone call.  Please give ample time to the testing facility, and our office to run,  receive and review results. Please do not call inquiring of results, even if you can see them in your chart. We will contact you as soon as we are able. If it has been over 1 week since the test was completed, and you have not yet heard from us , then please call us .    - echart message- for normal results that have been seen by the patient already.   - telephone call: abnormal results or if patient has not viewed results in their echart.  If a referral to a specialist was entered for you, please call us  in 2 weeks if you have not heard from the specialist office to schedule.

## 2024-03-10 ENCOUNTER — Ambulatory Visit: Payer: Self-pay | Admitting: Family Medicine

## 2024-03-10 LAB — TSH: TSH: 0.85 u[IU]/mL (ref 0.35–5.50)

## 2024-03-10 LAB — MAGNESIUM: Magnesium: 1.9 mg/dL (ref 1.5–2.5)

## 2024-03-10 LAB — VITAMIN D 25 HYDROXY (VIT D DEFICIENCY, FRACTURES): VITD: 29.09 ng/mL — ABNORMAL LOW (ref 30.00–100.00)

## 2024-05-07 ENCOUNTER — Ambulatory Visit

## 2024-05-07 DIAGNOSIS — Z1231 Encounter for screening mammogram for malignant neoplasm of breast: Secondary | ICD-10-CM

## 2024-05-19 ENCOUNTER — Encounter: Payer: Self-pay | Admitting: Family Medicine

## 2024-05-19 NOTE — Telephone Encounter (Signed)
 Copied from CRM #[id number]. Topic: Clinical - Medical Advice >> May 19, 2024  9:54 AM Corin V wrote: Reason for CRM: Patient had a Dexa scan done with [facility] for a study she is participating in about 2 weeks ago. She stated it's not a diagnostic study but she is in the normal range. She is uploading the results via MyChart. Please advise if she should keep the appointment for tomorrow. Please call back at [phone]

## 2024-05-19 NOTE — Telephone Encounter (Signed)
**Note De-identified  [place] Obfuscation** Please advise 

## 2024-05-20 ENCOUNTER — Other Ambulatory Visit

## 2024-05-21 ENCOUNTER — Ambulatory Visit: Admitting: Internal Medicine

## 2024-05-29 ENCOUNTER — Ambulatory Visit

## 2024-05-29 DIAGNOSIS — E2839 Other primary ovarian failure: Secondary | ICD-10-CM

## 2024-05-29 DIAGNOSIS — N760 Acute vaginitis: Secondary | ICD-10-CM

## 2024-05-29 DIAGNOSIS — Z1231 Encounter for screening mammogram for malignant neoplasm of breast: Secondary | ICD-10-CM

## 2024-06-01 ENCOUNTER — Ambulatory Visit: Admitting: Internal Medicine

## 2024-06-16 ENCOUNTER — Encounter: Payer: Self-pay | Admitting: Cardiology

## 2024-06-16 DIAGNOSIS — E785 Hyperlipidemia, unspecified: Secondary | ICD-10-CM

## 2024-06-16 DIAGNOSIS — I2510 Atherosclerotic heart disease of native coronary artery without angina pectoris: Secondary | ICD-10-CM

## 2024-06-16 MED ORDER — REPATHA SURECLICK 140 MG/ML ~~LOC~~ SOAJ: 1.0000 mL | SUBCUTANEOUS | 1 refills | Status: DC

## 2024-07-03 ENCOUNTER — Ambulatory Visit: Admitting: Internal Medicine

## 2024-07-06 ENCOUNTER — Ambulatory Visit (INDEPENDENT_AMBULATORY_CARE_PROVIDER_SITE_OTHER): Admitting: Internal Medicine

## 2024-07-06 ENCOUNTER — Encounter: Payer: Self-pay | Admitting: Internal Medicine

## 2024-07-06 VITALS — BP 122/64 | HR 75 | Ht 69.0 in | Wt 225.6 lb

## 2024-07-06 DIAGNOSIS — E785 Hyperlipidemia, unspecified: Secondary | ICD-10-CM

## 2024-07-06 DIAGNOSIS — E063 Autoimmune thyroiditis: Secondary | ICD-10-CM

## 2024-07-06 DIAGNOSIS — E559 Vitamin D deficiency, unspecified: Secondary | ICD-10-CM

## 2024-07-06 DIAGNOSIS — E1165 Type 2 diabetes mellitus with hyperglycemia: Secondary | ICD-10-CM

## 2024-07-06 LAB — POCT GLYCOSYLATED HEMOGLOBIN (HGB A1C): Hemoglobin A1C: 6.3 % — AB (ref 4.0–5.6)

## 2024-07-06 MED ORDER — METFORMIN HCL 1000 MG PO TABS: 1000.0000 mg | ORAL_TABLET | Freq: Every day | ORAL | Status: DC

## 2024-07-06 MED ORDER — FREESTYLE LIBRE 3 PLUS SENSOR MISC: 1.0000 | 3 refills | Status: DC

## 2024-07-06 NOTE — Patient Instructions (Addendum)
 Please continue Synthroid  125 mcg daily.  Take the thyroid  hormone every day, with water , at least 30 minutes before breakfast, separated by at least 4 hours from: - acid reflux medications - calcium  - iron - multivitamins  Please reduce: - Metformin  1000 mg in the evening  Continue: - Vitamin 5000 units daily  Please come back for a follow-up appointment in 6 months.

## 2024-07-06 NOTE — Progress Notes (Signed)
 Patient ID: Allison Compton, female   DOB: Feb 05, 1966, 58 y.o.   MRN: [id number]  HPI  Allison Compton is a 58 y.o.-year-old female, returning for f/u for Hashimoto's hypothyroidism, DM2, and vitamin D  deficiency. Last visit 7 mo ago.  Interim history: No increased urination, blurry vision, nausea, chest pain. She is doing intermittent fasting - eats first meal after 10 am.  She is in a study for weight loss in OA prevention - exercise and calorie control: 1300 cal a day. She lost 10 lbs in last 5 weeks. The study continues for 4 years.  Hashimoto's hypothyroidism: - dx'ed 04/2015.  She is on Synthroid  d.a.w. 125 mcg daily: - in am - fasting - at least 30 min from b'fast - + calcium  at bedtime - no iron - no multivitamins - no PPIs - not on Biotin  Reviewed her TFTs: Lab Results  Component Value Date   TSH 0.85 03/09/2024   TSH 1.09 02/13/2023   TSH 1.40 07/25/2022   TSH 0.92 02/05/2022   TSH 1.66 07/26/2021   TSH 1.13 01/19/2021   TSH 1.53 06/03/2020   TSH 3.93 07/17/2019   TSH 2.14 06/13/2018   TSH 3.57 01/07/2018   FREET4 1.16 07/25/2022   FREET4 1.13 07/26/2021   FREET4 1.26 01/19/2021   FREET4 1.03 06/03/2020   FREET4 0.99 07/17/2019   FREET4 1.04 06/13/2018   FREET4 0.99 01/07/2018   FREET4 1.5 05/17/2017   FREET4 1.28 11/20/2016   FREET4 1.29 07/27/2016    She has Hashimoto's thyroiditis: Component     Latest Ref Rng & Units 01/07/2018 06/13/2018  Thyroperoxidase Ab SerPl-aCnc     <9 IU/mL 96 (H) 139 (H)  Thyroglobulin Ab     < or = 1 IU/mL 1 <1   Component     Latest Ref Rng & Units 05/30/2015 05/28/2016 11/20/2016  Thyroperoxidase Ab SerPl-aCnc     <9 IU/mL 782 (H) 652 (H) 328 (H)  Thyroglobulin Ab     <2 IU/mL 2 (H) 1   Previously on low-dose selenium.  Pt denies: - feeling nodules in neck - hoarseness - dysphagia - choking  She has + FH of thyroid  disorders in: several cousins. No FH of thyroid  cancer. No h/o radiation tx to head or neck. No  herbal supplements. No Biotin use. No recent steroids use.   DM2: - dx'ed 10/2020  Reviewed HbA1c levels: Lab Results  Component Value Date   HGBA1C 6.7 (A) 11/21/2023   HGBA1C 7.3 (A) 07/22/2023   HGBA1C 7.1 (H) 02/13/2023   HGBA1C 6.5 07/25/2022   HGBA1C 6.4 (H) 02/05/2022   HGBA1C 6.3 (A) 06/14/2021   HGBA1C 6.3 06/14/2021   HGBA1C 6.3 06/14/2021   HGBA1C 6.3 06/14/2021   HGBA1C 6.5 (A) 01/19/2021   HGBA1C 6.7 (H) 10/13/2020   HGBA1C 6.8 (H) 06/03/2020   HGBA1C 6.4 07/17/2019   HGBA1C 6.0 (H) 09/26/2018   HGBA1C 6.3 06/13/2018   HGBA1C 6.4 01/07/2018   HGBA1C 5.8 (H) 05/17/2017   HGBA1C 6.2 11/20/2016   HGBA1C 6.0 05/28/2016   HGBA1C 6.3 12/19/2015   She was started on metformin  500 mg twice a day by PCP in 10/2020 >> 1000 mcg 2x a day (lunch and dinner)  -increased 07/2023  She checks sugars >4x a day with her Libre CGM - forgot receiver: - am: 120-140 - 2h after b'fast: 90-110 - lunch: <120 - 2h after lunch: 90-110 - dinner: 60-90 - 2h after dinner: 90-100 - bedtime:   Previously:  No CKD: Lab Results  Component Value Date   BUN 18 03/09/2024   Lab Results  Component Value Date   CREATININE 0.89 03/09/2024   No MAU: Lab Results  Component Value Date   MICRALBCREAT 3.5 03/09/2024   MICRALBCREAT 3 07/22/2023   MICRALBCREAT 21.5 09/29/2008   MICRALBCREAT 2.0 02/17/2008   MICRALBCREAT 3.9 05/29/2007   Last eye exam 11/04/2023: No DR. + low grade cataracts.  Last foot exam 11/21/2023.  + HL: Lab Results  Component Value Date   CHOL 152 03/09/2024   HDL 60.90 03/09/2024   LDLCALC 60 03/09/2024   LDLDIRECT 139.9 02/17/2008   TRIG 155.0 (H) 03/09/2024   CHOLHDL 2 03/09/2024  We tried to start Atorvastatin  but she developed mm aches and Achilles tendinitis (had to have sx) and we stopped.  Currently on Repatha , off ezetimibe  - per the Lipid clinic.  In the past, she was on a gluten and dairy free diet and her weight and TPO antibodies improved.   Afterwards, she tried a keto diet but TPO antibodies worsened.  She has a history of vitamin D  deficiency: Lab Results  Component Value Date   VD25OH 29.09 (L) 03/09/2024   VD25OH 36.14 02/13/2023   VD25OH 44.11 07/25/2022   VD25OH 39.13 07/26/2021   VD25OH 45.64 01/24/2021   VD25OH 30.06 10/13/2020   VD25OH 26.99 (L) 06/03/2020   VD25OH 27.13 (L) 07/17/2019   VD25OH 36 05/17/2017   VD25OH 48.22 11/20/2016   VD25OH 25.96 (L) 05/28/2016  She was on a vitamin D  supplement: 5000 units daily.  She was off at last visit and I advised her to restart.  She also has a history of hysterectomy in 2015. She had THR in 09/27/2018. She had a calcium  score of 0 in 08/2020. She had appendicitis 09/24/2020 and had to have laparoscopic appendectomy.  Her husband passed away in 27-Sep-2020 from metastatic prostate cancer.  ROS: + see HPI  I reviewed pt's medications, allergies, PMH, social hx, family hx, and changes were documented in the history of present illness. Otherwise, unchanged from my initial visit note.  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   Rectal hemorrhage 05/17/2017   Seasonal affective disorder    PMH of   Past Surgical History:  Procedure Laterality Date   ANAL FISTULOTOMY  2019   APPENDECTOMY  27-Sep-2020   CERVICAL BIOPSY  W/ LOOP ELECTRODE EXCISION  2002   LAPAROSCOPIC ASSISTED VAGINAL HYSTERECTOMY N/A 02/10/2014   Procedure: LAPAROSCOPIC ASSISTED VAGINAL HYSTERECTOMY;  Surgeon: Lynwood FORBES Curlene PONCE, MD;  Location: [facility];  Service: Gynecology;  Laterality: N/A;   SALPINGOOPHORECTOMY Bilateral 02/10/2014   Procedure: SALPINGO OOPHORECTOMY;  Surgeon:  Lynwood FORBES Curlene PONCE, MD;  Location: [facility];  Service: Gynecology;  Laterality: Bilateral;   TOTAL HIP ARTHROPLASTY Right 10/07/2018   Procedure: TOTAL HIP ARTHROPLASTY ANTERIOR APPROACH;  Surgeon: Ernie Cough, MD;  Location: [facility];  Service: Orthopedics;  Laterality: Right;  70 mins   Social History   Social History   Marital Status: Married    Spouse Name: N/A  Number of Children: 1   Occupational History   n/a   Social History Main Topics   Smoking status: Former Smoker    Quit date: 08/13/1985   Smokeless tobacco: Not on file     Comment: age [age], up to 1 ppd   Alcohol Use: Yes     Comment: rarely once a month or less   Drug Use: No   Current Outpatient Medications on File Prior to Visit  Medication Sig Dispense Refill   buPROPion  (WELLBUTRIN  XL) 150 MG 24 hr tablet Take 1 tablet (150 mg total) by mouth daily. 90 tablet 1   Cholecalciferol (VITAMIN D3) 125 MCG (5000 UT) TABS Take by mouth.     Continuous Glucose Sensor (FREESTYLE LIBRE 2 SENSOR) MISC 1 each by Does not apply route every 14 (fourteen) days. 6 each 3   Evolocumab  (REPATHA  SURECLICK) 140 MG/ML SOAJ Inject 140 mg into the skin every 14 (fourteen) days. 6 mL 1   metFORMIN  (GLUCOPHAGE ) 1000 MG tablet Take 1 tablet (1,000 mg total) by mouth 2 (two) times daily with a meal. 180 tablet 3   SYNTHROID  125 MCG tablet TAKE 1 TABLET (125 MCG TOTAL) DAILY BEFORE BREAKFAST 90 tablet 3   No current facility-administered medications on file prior to visit.   Allergies  Allergen Reactions   Atorvastatin      myalgia   Rosuvastatin      myalgias   Statins Other (See Comments)    Myalgia, Achilles tendonitis   Adhesive [Tape] Rash    Sometimes peels skin off   Latex Rash    Sometimes peels skin off   Nickel Rash   Family History  Problem Relation Age of Onset   Diabetes Mother    Asthma Mother    Seasonal affective disorder Mother    Clotting disorder Mother        excess platelets   COPD Mother    Cancer  Mother    Hyperlipidemia Mother    Stroke Mother    Kidney cancer Father    Hypertension Father    Cancer Father        kidney   Heart disease Father    Hyperlipidemia Maternal Grandmother    Stroke Maternal Grandmother 52       mini strokes   Pancreatic cancer Paternal Grandmother    Heart failure Paternal Grandmother    Heart failure Maternal Grandfather        MI @ 41   Alzheimer's disease Paternal Grandfather    Seasonal affective disorder Sister    GI problems Brother        GI bleed   Heart disease Paternal Uncle    PE: BP 122/64   Pulse 75   Ht 5' 9 (1.753 m)   Wt 225 lb 9.6 oz (102.3 kg)   LMP 02/06/2012   SpO2 95%   BMI 33.32 kg/m  Wt Readings from Last 15 Encounters:  07/06/24 225 lb 9.6 oz (102.3 kg)  03/09/24 233 lb 9.6 oz (106 kg)  01/17/24 234 lb (106.1 kg)  11/21/23 230 lb 6.4 oz (104.5 kg)  10/03/23 230 lb 12.8 oz (104.7 kg)  07/22/23 238 lb 3.2 oz (108 kg)  06/05/23 236 lb 9.6 oz (107.3 kg)  03/05/23 240 lb 6.4 oz (109 kg)  02/13/23 241 lb 9.6 oz (109.6 kg)  02/04/23 238 lb (108 kg)  12/31/22 241 lb 12.8 oz (109.7 kg)  09/25/22 235 lb (106.6 kg)  07/25/22 229 lb 12.8 oz (104.2 kg)  02/05/22 224 lb (101.6 kg)  07/26/21 230 lb 3.2 oz (104.4 kg)   Constitutional: overweight, in NAD Eyes:  EOMI, no exophthalmos ENT: no neck masses, no cervical lymphadenopathy Cardiovascular: RRR, No MRG Respiratory: CTA B Musculoskeletal: no deformities Skin:no rashes Neurological: no tremor with outstretched hands Diabetic Foot Exam - Simple   Simple Foot Form Diabetic Foot exam was performed with the following findings: Yes 07/06/2024  9:37 AM  Visual Inspection No deformities, no ulcerations, no other skin breakdown bilaterally: Yes Sensation Testing Intact to touch and monofilament testing bilaterally: Yes Pulse Check Posterior Tibialis and Dorsalis pulse intact bilaterally: Yes Comments    ASSESSMENT: 1. Hypothyroidism - Thyroid  U/S (06/13/2015):  Thyroid  tissue is mildly heterogeneous but no focal nodules.  2. DM2  3.  Hyperlipidemia  4. Vit D def.  PLAN:  1. Patient with Hashimoto's hypothyroidism, on name Synthroid  (from [facility] in [place] ) - latest thyroid  labs reviewed with pt. >> normal: Lab Results  Component Value Date   TSH 0.85 03/09/2024  - she continues on LT4 125 mcg daily - pt feels good on this dose. - we discussed about taking the thyroid  hormone every day, with water , >30 minutes before breakfast, separated by >4 hours from acid reflux medications, calcium , iron, multivitamins. Pt. is taking it correctly.   2.  Type 2 diabetes - She previously saw a nutritionist through her work and also worked with a veterinary surgeon to help her control her diet.  She was able to eliminate sweets and we also discussed about reducing fatty foods.    - In 2022, PCP started her on metformin  500 mg twice a day and we increased this to 1000 mg twice a day.  She continues on this now. - Before the last visit combined, she lost 16 pounds. - She continues on a CGM, for which she pays out-of-pocket -At last visit, sugars appears to be at goal during the night but they were increasing after approximately 6 AM, without necessarily eating.  They were more fluctuating later in the day but mostly within the target range.  We discussed about moving the metformin  to dinnertime to help with increased hepatic gluconeogenesis overnight.  HbA1c at that time was 6.7%, improved. - she did not bring her reader today >> could not review her CGM traces... At today's visit I advised her to try to start checking with her phone and we downloaded the right application on her phone.  I sent a prescription for the freestyle libre 3+ to her pharmacy, as she is still using the libre 2. - Sugars appear to be at goal, only slightly higher in the morning.  She has mild lows before dinner.  We discussed about stopping metformin  in the morning and continuing only with  1000 mg with dinner.  She would like to experiment with taking it later, maybe in the middle of the night, says she does have dawn phenomenon, to see if it would help.  I advised her to try this and see how it works. - we checked her HbA1c: 6.3% (improved) - advised to check sugars at different times of the day - 4x a day, rotating check times - advised for yearly eye exams >> she is UTD - return to clinic in 6 months  3.  Hyperlipidemia - Latest lipid fractions were at goal: Lab Results  Component Value Date   CHOL 152 03/09/2024   HDL 60.90 03/09/2024   LDLCALC 60 03/09/2024   LDLDIRECT 139.9 02/17/2008  TRIG 155.0 (H) 03/09/2024   CHOLHDL 2 03/09/2024  -We tried to use a statin but she developed myalgias and Achilles tendinitis-she had to have physical therapy for this the pain continued for several months.  This resolved after stopping the statin - She is currently seen in the lipid clinic and is on Repatha   4. Vit D def. - Latest vitamin D  level was slightly low: Lab Results  Component Value Date   VD25OH 29.09 (L) 03/09/2024  - At last visit she was off vitamin D -previously on 5000 units vitamin D  daily.  I advised her to restart it - further management per PCP   Lela Fendt, MD PhD [facility]

## 2024-07-28 ENCOUNTER — Other Ambulatory Visit: Payer: Self-pay | Admitting: Internal Medicine

## 2024-08-09 ENCOUNTER — Other Ambulatory Visit: Payer: Self-pay | Admitting: Internal Medicine

## 2024-08-11 ENCOUNTER — Other Ambulatory Visit (HOSPITAL_COMMUNITY): Payer: Self-pay

## 2024-08-11 ENCOUNTER — Telehealth: Payer: Self-pay | Admitting: Pharmacy Technician

## 2024-08-11 NOTE — Telephone Encounter (Signed)
" ° °  Pharmacy Patient Advocate Encounter   Received notification from CoverMyMeds that prior authorization for repatha  is required/requested.   Insurance verification completed.   The patient is insured through [employer].   Per test claim: unable to run test claim in our system. Per pa-drug is approved on plan-no pa required. I called [facility] and they said they just had to order it-unclear why pa came through. They said this will be in tomorrow.  "

## 2024-08-23 ENCOUNTER — Encounter: Payer: Self-pay | Admitting: Family Medicine

## 2024-08-23 ENCOUNTER — Encounter: Payer: Self-pay | Admitting: Internal Medicine

## 2024-08-23 ENCOUNTER — Encounter: Payer: Self-pay | Admitting: Cardiology

## 2024-08-23 DIAGNOSIS — I2510 Atherosclerotic heart disease of native coronary artery without angina pectoris: Secondary | ICD-10-CM

## 2024-08-23 DIAGNOSIS — E785 Hyperlipidemia, unspecified: Secondary | ICD-10-CM

## 2024-08-24 MED ORDER — REPATHA SURECLICK 140 MG/ML ~~LOC~~ SOAJ: 1.0000 mL | SUBCUTANEOUS | 1 refills | Status: AC

## 2024-08-24 MED ORDER — FREESTYLE LIBRE 3 PLUS SENSOR MISC: 1.0000 | 3 refills | Status: DC

## 2024-08-24 NOTE — Telephone Encounter (Signed)
 No further action needed at this time.

## 2024-08-26 ENCOUNTER — Other Ambulatory Visit: Payer: Self-pay

## 2024-08-26 DIAGNOSIS — F321 Major depressive disorder, single episode, moderate: Secondary | ICD-10-CM

## 2024-08-26 MED ORDER — FREESTYLE LIBRE 2 PLUS SENSOR MISC: 1.0000 | 3 refills | Status: DC

## 2024-08-26 MED ORDER — BUPROPION HCL ER (XL) 150 MG PO TB24: 150.0000 mg | ORAL_TABLET | Freq: Every day | ORAL | 0 refills | Status: DC

## 2024-08-26 NOTE — Addendum Note (Signed)
 Addended by: CLEOTILDE ROLIN RAMAN on: 08/26/2024 02:17 PM   Modules accepted: Orders

## 2024-09-02 ENCOUNTER — Telehealth: Payer: Self-pay

## 2024-09-02 MED ORDER — DEXCOM G7 SENSOR MISC: 3 refills | Status: DC

## 2024-09-02 NOTE — Telephone Encounter (Signed)
 Requested Prescriptions   Signed Prescriptions Disp Refills   Continuous Glucose Sensor (DEXCOM G7 SENSOR) MISC 9 each 3    Sig: Use to check glucose continuously, change sensor every 10 days    Authorizing Provider: Carlus Pavlov    Ordering User: Pollie Meyer

## 2024-09-02 NOTE — Telephone Encounter (Signed)
 Patients insurance prefers dexcom over [place].  Please send updated script to [employer]

## 2024-09-09 ENCOUNTER — Ambulatory Visit: Admitting: Family Medicine

## 2024-09-15 ENCOUNTER — Encounter: Payer: Self-pay | Admitting: Internal Medicine

## 2024-09-17 ENCOUNTER — Other Ambulatory Visit: Payer: Self-pay | Admitting: Medical Genetics

## 2024-09-17 ENCOUNTER — Telehealth: Payer: Self-pay

## 2024-09-17 NOTE — Telephone Encounter (Signed)
 Pharmacy Patient Advocate Encounter   Received notification from Patient Advice Request messages that prior authorization for Freestyle libre 3 plus sensor is required/requested.   Insurance verification completed.   The patient is insured through [facility].   To help increase the likelihood of successful prior authorization for a continuous glucose monitor, please review the documentation and include evidence of at least two hypoglycemic events (insurers often require glucose values <=54 mg/dL) and/or clear documentation that the patient relies on insulin to adequately control their diabetes.

## 2024-09-18 ENCOUNTER — Encounter: Payer: Self-pay | Admitting: Family Medicine

## 2024-09-18 ENCOUNTER — Ambulatory Visit: Admitting: Family Medicine

## 2024-09-18 VITALS — BP 120/72 | HR 78 | Wt 219.2 lb

## 2024-09-18 DIAGNOSIS — E559 Vitamin D deficiency, unspecified: Secondary | ICD-10-CM

## 2024-09-18 DIAGNOSIS — E063 Autoimmune thyroiditis: Secondary | ICD-10-CM

## 2024-09-18 DIAGNOSIS — F321 Major depressive disorder, single episode, moderate: Secondary | ICD-10-CM

## 2024-09-18 DIAGNOSIS — E1169 Type 2 diabetes mellitus with other specified complication: Secondary | ICD-10-CM

## 2024-09-18 DIAGNOSIS — Z8249 Family history of ischemic heart disease and other diseases of the circulatory system: Secondary | ICD-10-CM

## 2024-09-18 DIAGNOSIS — E1165 Type 2 diabetes mellitus with hyperglycemia: Secondary | ICD-10-CM

## 2024-09-18 LAB — MICROALBUMIN / CREATININE URINE RATIO
Creatinine,U: 31.9 mg/dL
Microalb Creat Ratio: UNDETERMINED mg/g (ref 0.0–30.0)
Microalb, Ur: <0.7 mg/dL

## 2024-09-18 LAB — LIPID PANEL
Cholesterol: 172 mg/dL (ref 28–200)
HDL: 57.1 mg/dL
LDL Cholesterol: 84 mg/dL (ref 10–99)
NonHDL: 114.73
Total CHOL/HDL Ratio: 3
Triglycerides: 155 mg/dL — ABNORMAL HIGH (ref 10.0–149.0)
VLDL: 31 mg/dL (ref 0.0–40.0)

## 2024-09-18 LAB — COMPREHENSIVE METABOLIC PANEL WITH GFR
ALT: 16 U/L (ref 3–35)
AST: 16 U/L (ref 5–37)
Albumin: 4.6 g/dL (ref 3.5–5.2)
Alkaline Phosphatase: 86 U/L (ref 39–117)
BUN: 15 mg/dL (ref 6–23)
CO2: 30 meq/L (ref 19–32)
Calcium: 9.7 mg/dL (ref 8.4–10.5)
Chloride: 102 meq/L (ref 96–112)
Creatinine, Ser: 0.88 mg/dL (ref 0.40–1.20)
GFR: 71.11 mL/min
Glucose, Bld: 124 mg/dL — ABNORMAL HIGH (ref 70–99)
Potassium: 5 meq/L (ref 3.5–5.1)
Sodium: 141 meq/L (ref 135–145)
Total Bilirubin: 0.5 mg/dL (ref 0.2–1.2)
Total Protein: 7.4 g/dL (ref 6.0–8.3)

## 2024-09-18 LAB — VITAMIN D 25 HYDROXY (VIT D DEFICIENCY, FRACTURES): VITD: 29.83 ng/mL — ABNORMAL LOW (ref 30.00–100.00)

## 2024-09-18 MED ORDER — BUPROPION HCL ER (XL) 150 MG PO TB24: 150.0000 mg | ORAL_TABLET | Freq: Every day | ORAL | 1 refills | Status: AC

## 2024-09-18 NOTE — Patient Instructions (Signed)

## 2024-09-18 NOTE — Progress Notes (Unsigned)
 "   Patient ID: Allison Compton, female  DOB: [birth year], 59 y.o.   MRN: [id number] Patient Care Team    Relationship Specialty Notifications Start End  Catherine Charlies DELENA, DO PCP - General Family Medicine  12/22/15   Trixie File, MD Consulting Physician Internal Medicine  12/23/15    Comment: prediabetes/thyroid   Ernie Cough, MD Consulting Physician Orthopedic Surgery  02/05/22   Kit Rush, MD Consulting Physician Orthopedic Surgery  02/05/22   Kate Lonni CROME, MD Consulting Physician Cardiology  02/05/22   Sharman Doffing, MD Referring Physician Obstetrics and Gynecology  02/05/22   Stacia Glendia BRAVO, MD Consulting Physician Gastroenterology  02/13/23     Chief Complaint  Patient presents with   Weight Loss    Enrolled in weight management at [employer]    Subjective: Allison Compton is a 59 y.o.  Female  present for chronic condition management All past medical history, surgical history, allergies, family history, immunizations, medications and social history were updated in the electronic medical record today. All recent labs, ED visits and hospitalizations within the last year were reviewed.  Depression, major, single episode, moderate (HCC)/Hot flashes Patient reports compliance with Wellbutrin  150 mg daily.  She feels her blood pressures and mood are stable on current regimen.   Mixed hyperlipidemia/Family history of heart disease Patient reports she is compliant with repatha  by cardio.    Diabetes and hypothyroidism due to Hashimoto's thyroiditis Managed by endocrine.  Compliant with Synthroid   Stable   Vitamin D  deficiency Reports she has  been consistent with taking her vitamin D  supplement.   Cardiac CT 08/2020: IMPRESSION: 1. Coronary calcium  score of 0. 2. Normal coronary origin with right dominance. 3. 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 Xray with degeneration at  L5-S1. Pt declined gabapentin  use. Prior note:  Pt presents for an OV with complaints of b/l toe numbness  of 1 year duration.  Associated symptoms include tingling/stabbing sensation that is worse over the second and third toe bilaterally, but present overall toes.  She also feels this sensation is mostly on the bottom of the feet/toes.  No temperature change or color change.  Can be worse at times, although no particular position or activity causes increase in symptoms. She has never had to have back surgery, but she did have a back injury in her early 36s in which she received chiropractic care for some time.  She also reports she fell when she was pregnant and landed on her sacrum breaking her coccyx at the time. She denies any back pain currently.  12/31/2022 FINDINGS: Five lumbar-type vertebral bodies. Preserved vertebral body heights. No listhesis. Moderate disc height loss at L5-S1. Mild scattered endplate osteophytes. No listhesis. Preserved bone mineralization. Prominent lower lumbar facet degenerative changes. Additional workup for stenosis as clinically directed with CT or MRI. IMPRESSION: Scattered degenerative changes greatest at L5-S1.     09/18/2024   11:35 AM 03/09/2024    7:57 AM 12/31/2022    8:20 AM 02/05/2022    3:39 PM 06/14/2021    3:48 PM  Depression screen PHQ 2/9  Decreased Interest 0  0 0 0  Down, Depressed, Hopeless 0 0 0 0 0  PHQ - 2 Score 0 0 0 0 0  Altered sleeping 0 0  0 1  Tired, decreased energy 2 0  0 1  Change in appetite 0 2  2 2   Feeling bad or failure about  yourself  0 0  0 0  Trouble concentrating 0 0  0 0  Moving slowly or fidgety/restless 0 0  0 0  Suicidal thoughts 0 0  0 0  PHQ-9 Score 2 2   2  4    Difficult doing work/chores Not difficult at all Not difficult at all        Data saved with a previous flowsheet row definition      03/09/2024    7:58 AM 02/05/2022    3:39 PM 06/14/2021    3:48 PM 06/16/2020    8:19 AM  GAD 7 : Generalized  Anxiety Score  Nervous, Anxious, on Edge 0  0  0  0   Control/stop worrying 0  0  0  0   Worry too much - different things 0  0  0  0   Trouble relaxing 0  0  1  2   Restless 0  0  0  0   Easily annoyed or irritable 0  0  1  0   Afraid - awful might happen 0  0  0  0   Total GAD 7 Score 0 0 2 2  Anxiety Difficulty Not difficult at all        Data saved with a previous flowsheet row definition      10/09/2018    8:10 AM 09/02/2019    8:58 AM 12/31/2022    8:19 AM 03/09/2024    7:57 AM 09/18/2024   11:35 AM  Fall Risk  Falls in the past year?  0  0 0 0  Was there an injury with Fall?   0   0  Fall Risk Category Calculator   0  0  (RETIRED) Patient Fall Risk Level High fall risk       Patient at Risk for Falls Due to     No Fall Risks  Fall risk Follow up   Falls evaluation completed Falls evaluation completed Falls evaluation completed     Data saved with a previous flowsheet row definition     Immunization History  Administered Date(s) Administered   Hep A / Hep B 02/13/2023, 03/18/2023, 08/23/2023   Influenza Whole 06/10/2007   Influenza,inj,Quad PF,6+ Mos 07/17/2019   Moderna Sars-Covid-2 Vaccination 10/27/2019, 11/13/2019, 06/11/2020, 01/28/2021   PNEUMOCOCCAL CONJUGATE-20 03/09/2024   Pneumococcal Polysaccharide-23 02/11/2014   Td 08/13/1996   Tdap 02/13/2023   Zoster Recombinant(Shingrix ) 02/05/2022, 06/01/2022   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   Rectal hemorrhage 05/17/2017   Seasonal affective disorder    PMH of   Allergies  Allergen Reactions   Atorvastatin      myalgia   Rosuvastatin      myalgias   Statins Other (See  Comments)    Myalgia, Achilles tendonitis   Adhesive [Tape] Rash    Sometimes peels skin off   Latex Rash    Sometimes peels skin off   Nickel Rash   Past Surgical History:  Procedure Laterality Date   ANAL FISTULOTOMY  2019   APPENDECTOMY  09/2020  CERVICAL BIOPSY  W/ LOOP ELECTRODE EXCISION  2002   LAPAROSCOPIC ASSISTED VAGINAL HYSTERECTOMY N/A 02/10/2014   Procedure: LAPAROSCOPIC ASSISTED VAGINAL HYSTERECTOMY;  Surgeon: Lynwood FORBES Curlene PONCE, MD;  Location: [facility];  Service: Gynecology;  Laterality: N/A;   SALPINGOOPHORECTOMY Bilateral 02/10/2014   Procedure: SALPINGO OOPHORECTOMY;  Surgeon: Lynwood FORBES Curlene PONCE, MD;  Location: [facility];  Service: Gynecology;  Laterality: Bilateral;   TOTAL HIP ARTHROPLASTY Right 10/07/2018   Procedure: TOTAL HIP ARTHROPLASTY ANTERIOR APPROACH;  Surgeon: Ernie Cough, MD;  Location: [facility];  Service: Orthopedics;  Laterality: Right;  70 mins   Family History  Problem Relation Age of Onset   Diabetes Mother    Asthma Mother    Seasonal affective disorder Mother    Clotting disorder Mother        excess platelets   COPD Mother    Cancer Mother    Hyperlipidemia Mother    Stroke Mother    Kidney cancer Father    Hypertension Father    Cancer Father        kidney   Heart disease Father    Hyperlipidemia Maternal Grandmother    Stroke Maternal Grandmother 68       mini strokes   Pancreatic cancer Paternal Grandmother    Heart failure Paternal Grandmother    Heart failure Maternal Grandfather        MI @ 40   Alzheimer's disease Paternal Grandfather    Seasonal affective disorder Sister    GI problems Brother        GI bleed   Heart disease Paternal Uncle    Social History   Social History Narrative   Married, Designer, 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    Drinks caffeine occasionally, uses herbal remedies.   Wears her seatbelt, smoke detector in the home.   Firearms in the home, locked cabinet.   Feels safe in her  relationships.         Epworth Sleepiness Scale = 6 (as of 05/09/2016)    Allergies as of 09/18/2024       Reactions   Atorvastatin     myalgia   Rosuvastatin     myalgias   Statins Other (See Comments)   Myalgia, Achilles tendonitis   Adhesive [tape] Rash   Sometimes peels skin off   Latex Rash   Sometimes peels skin off   Nickel Rash        Medication List        Accurate as of September 18, 2024 11:57 AM. If you have any questions, ask your nurse or doctor.          STOP taking these medications    Dexcom G7 Sensor Misc Stopped by: Charlies Bellini, DO       TAKE these medications    buPROPion  150 MG 24 hr tablet Commonly known as: WELLBUTRIN  XL Take 1 tablet (150 mg total) by mouth daily.   metFORMIN  1000 MG tablet Commonly known as: GLUCOPHAGE  Take 1 tablet (1,000 mg) by mouth in the evening   Repatha  SureClick 140 MG/ML Soaj Generic drug: Evolocumab  Inject 140 mg into the skin every 14 (fourteen) days.   Synthroid  125 MCG tablet Generic drug: levothyroxine  TAKE 1 TABLET (125 MCG TOTAL) DAILY BEFORE BREAKFAST   Vitamin D3 125 MCG (5000 UT) Tabs Take by mouth.        All past medical history, surgical history, allergies, family history, immunizations andmedications were updated in the EMR  today and reviewed under the history and medication portions of their EMR.       CT CORONARY MORPH W/CTA COR W/SCORE W/CA W/CM &/OR WO/CM Addendum Date: 08/26/2020   IMPRESSION: 1. Coronary calcium  score of 0. 2. Normal coronary origin with right dominance. 3. 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 CAD-RADS 2. Mild non-obstructive CAD (25-49%). Consider non-atherosclerotic causes of chest pain. Consider preventive therapy and risk factor modification.  Result Date: 08/26/2020  IMPRESSION: No significant incidental noncardiac findings are noted.    ROS 14 pt review of systems performed and negative (unless  mentioned in an HPI)  Objective: BP 120/72   Pulse 78   Wt 219 lb 3.2 oz (99.4 kg)   LMP 02/06/2012   SpO2 98%   BMI 32.37 kg/m  Physical Exam Vitals and nursing note reviewed.  Constitutional:      General: She is not in acute distress.    Appearance: Normal appearance. She is obese. She is not ill-appearing or toxic-appearing.  HENT:     Head: Normocephalic and atraumatic.     Right Ear: Tympanic membrane, ear canal and external ear normal. There is no impacted cerumen.     Left Ear: Tympanic membrane, ear canal and external ear normal. There is no impacted cerumen.     Nose: No congestion or rhinorrhea.     Mouth/Throat:     Mouth: Mucous membranes are moist.     Pharynx: Oropharynx is clear. No oropharyngeal exudate or posterior oropharyngeal erythema.  Eyes:     General: No scleral icterus.       Right eye: No discharge.        Left eye: No discharge.     Extraocular Movements: Extraocular movements intact.     Conjunctiva/sclera: Conjunctivae normal.     Pupils: Pupils are equal, round, and reactive to light.  Cardiovascular:     Rate and Rhythm: Normal rate and regular rhythm.     Pulses: Normal pulses.     Heart sounds: Normal heart sounds. No murmur heard.    No friction rub. No gallop.  Pulmonary:     Effort: Pulmonary effort is normal. No respiratory distress.     Breath sounds: Normal breath sounds. No stridor. No wheezing, rhonchi or rales.  Chest:     Chest wall: No tenderness.  Abdominal:     General: Abdomen is flat. Bowel sounds are normal. There is no distension.     Palpations: Abdomen is soft. There is no mass.     Tenderness: There is no abdominal tenderness. There is no right CVA tenderness, left CVA tenderness, guarding or rebound.     Hernia: No hernia is present.  Musculoskeletal:        General: No swelling, tenderness or deformity. Normal range of motion.     Cervical back: Normal range of motion and neck supple. No rigidity or tenderness.      Right lower leg: No edema.     Left lower leg: No edema.  Lymphadenopathy:     Cervical: No cervical adenopathy.  Skin:    General: Skin is warm and dry.     Coloration: Skin is not jaundiced or pale.     Findings: No bruising, erythema, lesion or rash.  Neurological:     General: No focal deficit present.     Mental Status: She is alert and oriented to person, place, and time. Mental status is at baseline.  Cranial Nerves: No cranial nerve deficit.     Sensory: No sensory deficit.     Motor: No weakness.     Coordination: Coordination normal.     Gait: Gait normal.     Deep Tendon Reflexes: Reflexes normal.  Psychiatric:        Mood and Affect: Mood normal.        Behavior: Behavior normal.        Thought Content: Thought content normal.        Judgment: Judgment normal.     No results found. Diabetic Foot Exam - Simple   Simple Foot Form Diabetic Foot exam was performed with the following findings: Yes 09/18/2024 11:38 AM  Visual Inspection No deformities, no ulcerations, no other skin breakdown bilaterally: Yes Sensation Testing Intact to touch and monofilament testing bilaterally: Yes Pulse Check Posterior Tibialis and Dorsalis pulse intact bilaterally: Yes Comments     Assessment/plan: Allison Compton is a 60 y.o. female present for Chronic Conditions/illness Management Depression, major, single episode, moderate (HCC)/Hot flashes Stable Continue Wellbutrin  150 mg daily (and appetite suppression)  Mixed hyperlipidemia/Family history of heart disease Continue Repatha  bradycardia Lipids collected today  Hypothyroidism due to Hashimoto's thyroiditis Managed by endocrine.  TSH collected today   Diabetes w/ HLD: Managed by endocrine - microalb.  Collected 03/09/2024 PNA series:completed today 03/09/2024 Flu shot: (recommneded yearly) Foot exam: 11/21/2023 Eye exam: Completed 11/04/2023*** coming uo    Return in about 6 months (around 03/10/2025) for cpe (20  min), Routine chronic condition follow-up.  Orders Placed This Encounter  Procedures   Comp Met (CMET)   Lipid panel   Urine Microalbumin w/creat. ratio   Vitamin D  (25 hydroxy)   Meds ordered this encounter  Medications   buPROPion  (WELLBUTRIN  XL) 150 MG 24 hr tablet    Sig: Take 1 tablet (150 mg total) by mouth daily.    Dispense:  90 tablet    Refill:  1   Referral Orders  No referral(s) requested today     Electronically signed by: Charlies Bellini, DO [facility] "

## 2024-09-18 NOTE — Progress Notes (Deleted)
 "      Allison Compton , Aug 08, 1966, 59 y.o., female MRN: [id number] Patient Care Team    Relationship Specialty Notifications Start End  Catherine Charlies DELENA, DO PCP - General Family Medicine  12/22/15   Trixie File, MD Consulting Physician Internal Medicine  12/23/15    Comment: prediabetes/thyroid   Ernie Cough, MD Consulting Physician Orthopedic Surgery  02/05/22   Kit Rush, MD Consulting Physician Orthopedic Surgery  02/05/22   Kate Lonni CROME, MD Consulting Physician Cardiology  02/05/22   Sharman Doffing, MD Referring Physician Obstetrics and Gynecology  02/05/22   Stacia Glendia BRAVO, MD Consulting Physician Gastroenterology  02/13/23     No chief complaint on file.    Subjective: Allison Compton is a 59 y.o. Pt presents for an OV with complaints of *** of *** duration.  Associated symptoms include ***.  Pt has tried *** to ease their symptoms.      03/09/2024    7:57 AM 12/31/2022    8:20 AM 02/05/2022    3:39 PM 06/14/2021    3:48 PM 06/16/2020    8:17 AM  Depression screen PHQ 2/9  Decreased Interest  0 0 0 3  Down, Depressed, Hopeless 0 0 0 0 3  PHQ - 2 Score 0 0 0 0 6  Altered sleeping 0  0 1 2  Tired, decreased energy 0  0 1 3  Change in appetite 2  2 2 3   Feeling bad or failure about yourself  0  0 0 3  Trouble concentrating 0  0 0 0  Moving slowly or fidgety/restless 0  0 0 0  Suicidal thoughts 0  0 0 0  PHQ-9 Score 2   2  4  17    Difficult doing work/chores Not difficult at all         Data saved with a previous flowsheet row definition    Allergies[1] Social History   Social History Narrative   Married, Designer, 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    Drinks caffeine occasionally, uses herbal remedies.   Wears her seatbelt, smoke detector in the home.   Firearms in the home, locked cabinet.   Feels safe in her relationships.         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   Rectal hemorrhage 05/17/2017   Seasonal affective disorder    PMH of   Past Surgical History:  Procedure Laterality Date   ANAL FISTULOTOMY  2019   APPENDECTOMY  09/2020   CERVICAL BIOPSY  W/ LOOP ELECTRODE EXCISION  2002   LAPAROSCOPIC ASSISTED VAGINAL HYSTERECTOMY N/A 02/10/2014   Procedure: LAPAROSCOPIC ASSISTED VAGINAL HYSTERECTOMY;  Surgeon: Lynwood BRAVO Curlene PONCE, MD;  Location: [facility];  Service: Gynecology;  Laterality: N/A;   SALPINGOOPHORECTOMY Bilateral 02/10/2014   Procedure: SALPINGO OOPHORECTOMY;  Surgeon: Lynwood BRAVO Curlene PONCE, MD;  Location: [facility];  Service: Gynecology;  Laterality: Bilateral;   TOTAL HIP ARTHROPLASTY Right 10/07/2018  Procedure: TOTAL HIP ARTHROPLASTY ANTERIOR APPROACH;  Surgeon: Ernie Cough, MD;  Location: [facility];  Service: Orthopedics;  Laterality: Right;  70 mins   Family History  Problem Relation Age of Onset   Diabetes Mother    Asthma Mother    Seasonal affective disorder Mother    Clotting disorder Mother        excess platelets   COPD Mother    Cancer Mother    Hyperlipidemia Mother    Stroke Mother    Kidney cancer Father    Hypertension Father    Cancer Father        kidney   Heart disease Father    Hyperlipidemia Maternal Grandmother    Stroke Maternal Grandmother 13       mini strokes   Pancreatic cancer Paternal Grandmother    Heart failure Paternal Grandmother    Heart failure Maternal Grandfather        MI @ 37   Alzheimer's disease Paternal Grandfather    Seasonal affective disorder Sister    GI problems Brother        GI bleed   Heart disease Paternal Uncle    Allergies as of 09/18/2024        Reactions   Atorvastatin     myalgia   Rosuvastatin     myalgias   Statins Other (See Comments)   Myalgia, Achilles tendonitis   Adhesive [tape] Rash   Sometimes peels skin off   Latex Rash   Sometimes peels skin off   Nickel Rash        Medication List        Accurate as of September 18, 2024  9:16 AM. If you have any questions, ask your nurse or doctor.          buPROPion  150 MG 24 hr tablet Commonly known as: WELLBUTRIN  XL Take 1 tablet (150 mg total) by mouth daily.   Dexcom G7 Sensor Misc Use to check glucose continuously, change sensor every 10 days   metFORMIN  1000 MG tablet Commonly known as: GLUCOPHAGE  Take 1 tablet (1,000 mg) by mouth in the evening   Repatha  SureClick 140 MG/ML Soaj Generic drug: Evolocumab  Inject 140 mg into the skin every 14 (fourteen) days.   Synthroid  125 MCG tablet Generic drug: levothyroxine  TAKE 1 TABLET (125 MCG TOTAL) DAILY BEFORE BREAKFAST   Vitamin D3 125 MCG (5000 UT) Tabs Take by mouth.        All past medical history, surgical history, allergies, family history, immunizations andmedications were updated in the EMR today and reviewed under the history and medication portions of their EMR.     ROS Negative, with the exception of above mentioned in HPI   Objective:  LMP 02/06/2012  There is no height or weight on file to calculate BMI.  Physical Exam   No results found. No results found. No results found for this or any previous visit (from the past 24 hours).  Assessment/Plan: Allison Compton is a 59 y.o. female present for OV for  *** Reviewed expectations re: course of current medical issues. Discussed self-management of symptoms. Outlined signs and symptoms indicating need for more acute intervention. Patient verbalized understanding and all questions were answered. Patient received an After-Visit Summary.    No orders of the defined types were placed in this encounter.  No orders of the  defined types were placed in this encounter.  Referral Orders  No referral(s) requested today     Note is dictated utilizing voice  recognition software. Although note has been proof read prior to signing, occasional typographical errors still can be missed. If any questions arise, please do not hesitate to call for verification.   electronically signed by:  Charlies Bellini, DO  [facility]       [1]  Allergies Allergen Reactions   Atorvastatin      myalgia   Rosuvastatin      myalgias   Statins Other (See Comments)    Myalgia, Achilles tendonitis   Adhesive [Tape] Rash    Sometimes peels skin off   Latex Rash    Sometimes peels skin off   Nickel Rash   "

## 2024-10-14 ENCOUNTER — Other Ambulatory Visit (HOSPITAL_COMMUNITY)

## 2025-01-05 ENCOUNTER — Ambulatory Visit: Admitting: Internal Medicine

## 2025-03-16 ENCOUNTER — Encounter: Admitting: Family Medicine
